# Patient Record
Sex: Female | Born: 1935 | Race: Black or African American | Hispanic: No | Smoking: Former smoker
Health system: Southern US, Community
[De-identification: ages and names within clinical notes are randomized; demographics above are authoritative.]

## PROBLEM LIST (undated history)

## (undated) DIAGNOSIS — R51 Headache: Secondary | ICD-10-CM

## (undated) DIAGNOSIS — I10 Essential (primary) hypertension: Secondary | ICD-10-CM

## (undated) DIAGNOSIS — F411 Generalized anxiety disorder: Secondary | ICD-10-CM

## (undated) DIAGNOSIS — M1990 Unspecified osteoarthritis, unspecified site: Secondary | ICD-10-CM

## (undated) DIAGNOSIS — Z862 Personal history of diseases of the blood and blood-forming organs and certain disorders involving the immune mechanism: Secondary | ICD-10-CM

## (undated) DIAGNOSIS — E119 Type 2 diabetes mellitus without complications: Secondary | ICD-10-CM

## (undated) DIAGNOSIS — E785 Hyperlipidemia, unspecified: Secondary | ICD-10-CM

## (undated) DIAGNOSIS — K259 Gastric ulcer, unspecified as acute or chronic, without hemorrhage or perforation: Secondary | ICD-10-CM

## (undated) DIAGNOSIS — Z8639 Personal history of other endocrine, nutritional and metabolic disease: Secondary | ICD-10-CM

## (undated) HISTORY — DX: Generalized anxiety disorder: F41.1

## (undated) HISTORY — DX: Personal history of other endocrine, nutritional and metabolic disease: Z86.39

## (undated) HISTORY — DX: Personal history of diseases of the blood and blood-forming organs and certain disorders involving the immune mechanism: Z86.2

## (undated) HISTORY — DX: Gastric ulcer, unspecified as acute or chronic, without hemorrhage or perforation: K25.9

## (undated) HISTORY — DX: Essential (primary) hypertension: I10

## (undated) HISTORY — DX: Hyperlipidemia, unspecified: E78.5

## (undated) HISTORY — PX: CATARACT EXTRACTION: SUR2

## (undated) HISTORY — PX: ABDOMINAL HYSTERECTOMY: SHX81

## (undated) HISTORY — DX: Headache: R51

## (undated) HISTORY — DX: Unspecified osteoarthritis, unspecified site: M19.90

## (undated) HISTORY — DX: Type 2 diabetes mellitus without complications: E11.9

---

## 2000-04-01 ENCOUNTER — Encounter: Admission: RE | Admit: 2000-04-01 | Discharge: 2000-04-01 | Payer: Self-pay | Admitting: Internal Medicine

## 2000-04-01 ENCOUNTER — Encounter: Payer: Self-pay | Admitting: Internal Medicine

## 2001-04-12 ENCOUNTER — Encounter: Payer: Self-pay | Admitting: Internal Medicine

## 2001-04-12 ENCOUNTER — Encounter: Admission: RE | Admit: 2001-04-12 | Discharge: 2001-04-12 | Payer: Self-pay | Admitting: Internal Medicine

## 2002-04-10 ENCOUNTER — Encounter: Admission: RE | Admit: 2002-04-10 | Discharge: 2002-04-10 | Payer: Self-pay | Admitting: Internal Medicine

## 2002-04-10 ENCOUNTER — Encounter: Payer: Self-pay | Admitting: Internal Medicine

## 2003-11-01 ENCOUNTER — Ambulatory Visit (HOSPITAL_COMMUNITY): Admission: RE | Admit: 2003-11-01 | Discharge: 2003-11-01 | Payer: Self-pay | Admitting: Internal Medicine

## 2004-08-25 ENCOUNTER — Ambulatory Visit: Payer: Self-pay | Admitting: Internal Medicine

## 2004-11-18 ENCOUNTER — Ambulatory Visit: Payer: Self-pay | Admitting: Internal Medicine

## 2004-11-19 ENCOUNTER — Encounter: Admission: RE | Admit: 2004-11-19 | Discharge: 2004-11-19 | Payer: Self-pay | Admitting: Internal Medicine

## 2004-12-17 ENCOUNTER — Ambulatory Visit: Payer: Self-pay | Admitting: Internal Medicine

## 2005-01-14 ENCOUNTER — Ambulatory Visit: Payer: Self-pay | Admitting: Internal Medicine

## 2005-01-22 ENCOUNTER — Emergency Department (HOSPITAL_COMMUNITY): Admission: EM | Admit: 2005-01-22 | Discharge: 2005-01-22 | Payer: Self-pay | Admitting: Emergency Medicine

## 2005-03-17 ENCOUNTER — Ambulatory Visit: Payer: Self-pay | Admitting: Internal Medicine

## 2005-04-15 ENCOUNTER — Ambulatory Visit: Payer: Self-pay | Admitting: Internal Medicine

## 2005-06-24 ENCOUNTER — Ambulatory Visit: Payer: Self-pay | Admitting: Internal Medicine

## 2005-09-23 ENCOUNTER — Ambulatory Visit: Payer: Self-pay | Admitting: Internal Medicine

## 2005-09-24 ENCOUNTER — Encounter: Payer: Self-pay | Admitting: Internal Medicine

## 2005-12-31 ENCOUNTER — Ambulatory Visit: Payer: Self-pay | Admitting: Internal Medicine

## 2006-03-16 ENCOUNTER — Encounter: Admission: RE | Admit: 2006-03-16 | Discharge: 2006-03-16 | Payer: Self-pay | Admitting: Internal Medicine

## 2006-03-16 ENCOUNTER — Encounter: Payer: Self-pay | Admitting: Internal Medicine

## 2006-03-31 ENCOUNTER — Ambulatory Visit: Payer: Self-pay | Admitting: Internal Medicine

## 2006-07-07 ENCOUNTER — Ambulatory Visit: Payer: Self-pay | Admitting: Internal Medicine

## 2006-10-27 ENCOUNTER — Ambulatory Visit: Payer: Self-pay | Admitting: Internal Medicine

## 2006-10-27 LAB — CONVERTED CEMR LAB
ALT: 11 units/L (ref 0–40)
Alkaline Phosphatase: 79 units/L (ref 39–117)
BUN: 18 mg/dL (ref 6–23)
Basophils Absolute: 0 10*3/uL (ref 0.0–0.1)
Basophils Relative: 0.5 % (ref 0.0–1.0)
Bilirubin, Direct: 0.1 mg/dL (ref 0.0–0.3)
CO2: 27 meq/L (ref 19–32)
Calcium: 9.5 mg/dL (ref 8.4–10.5)
Eosinophils Absolute: 0.1 10*3/uL (ref 0.0–0.6)
HCT: 38.3 % (ref 36.0–46.0)
Hemoglobin: 13.2 g/dL (ref 12.0–15.0)
MCHC: 34.4 g/dL (ref 30.0–36.0)
MCV: 79.3 fL (ref 78.0–100.0)
Monocytes Absolute: 0.6 10*3/uL (ref 0.2–0.7)
Monocytes Relative: 12.2 % — ABNORMAL HIGH (ref 3.0–11.0)
Platelets: 168 10*3/uL (ref 150–400)
Sodium: 143 meq/L (ref 135–145)
Total Protein: 7.6 g/dL (ref 6.0–8.3)
WBC: 4.6 10*3/uL (ref 4.5–10.5)

## 2007-01-31 ENCOUNTER — Encounter: Payer: Self-pay | Admitting: Internal Medicine

## 2007-01-31 ENCOUNTER — Ambulatory Visit: Payer: Self-pay | Admitting: Internal Medicine

## 2007-01-31 DIAGNOSIS — R51 Headache: Secondary | ICD-10-CM

## 2007-01-31 DIAGNOSIS — F411 Generalized anxiety disorder: Secondary | ICD-10-CM

## 2007-01-31 DIAGNOSIS — E119 Type 2 diabetes mellitus without complications: Secondary | ICD-10-CM | POA: Insufficient documentation

## 2007-01-31 DIAGNOSIS — E785 Hyperlipidemia, unspecified: Secondary | ICD-10-CM

## 2007-01-31 DIAGNOSIS — R519 Headache, unspecified: Secondary | ICD-10-CM | POA: Insufficient documentation

## 2007-01-31 DIAGNOSIS — M1990 Unspecified osteoarthritis, unspecified site: Secondary | ICD-10-CM | POA: Insufficient documentation

## 2007-01-31 DIAGNOSIS — I10 Essential (primary) hypertension: Secondary | ICD-10-CM

## 2007-01-31 HISTORY — DX: Headache: R51

## 2007-01-31 HISTORY — DX: Unspecified osteoarthritis, unspecified site: M19.90

## 2007-01-31 HISTORY — DX: Hyperlipidemia, unspecified: E78.5

## 2007-01-31 HISTORY — DX: Essential (primary) hypertension: I10

## 2007-01-31 HISTORY — DX: Type 2 diabetes mellitus without complications: E11.9

## 2007-01-31 HISTORY — DX: Generalized anxiety disorder: F41.1

## 2007-04-28 ENCOUNTER — Telehealth: Payer: Self-pay | Admitting: Internal Medicine

## 2007-05-04 ENCOUNTER — Ambulatory Visit: Payer: Self-pay | Admitting: Internal Medicine

## 2007-05-04 LAB — CONVERTED CEMR LAB: Hgb A1c MFr Bld: 6.9 % — ABNORMAL HIGH (ref 4.6–6.0)

## 2007-08-01 ENCOUNTER — Ambulatory Visit: Payer: Self-pay | Admitting: Internal Medicine

## 2007-08-01 LAB — CONVERTED CEMR LAB: Hgb A1c MFr Bld: 7 % — ABNORMAL HIGH (ref 4.6–6.0)

## 2007-11-01 ENCOUNTER — Telehealth: Payer: Self-pay | Admitting: Internal Medicine

## 2007-11-10 ENCOUNTER — Ambulatory Visit: Payer: Self-pay | Admitting: Internal Medicine

## 2007-11-11 LAB — CONVERTED CEMR LAB
AST: 18 units/L (ref 0–37)
Albumin: 3.5 g/dL (ref 3.5–5.2)
Basophils Absolute: 0 10*3/uL (ref 0.0–0.1)
Basophils Relative: 0.1 % (ref 0.0–1.0)
Bilirubin, Direct: 0.1 mg/dL (ref 0.0–0.3)
CO2: 27 meq/L (ref 19–32)
Creatinine, Ser: 0.8 mg/dL (ref 0.4–1.2)
Eosinophils Absolute: 0 10*3/uL (ref 0.0–0.7)
Eosinophils Relative: 1 % (ref 0.0–5.0)
GFR calc Af Amer: 88 mL/min
Glucose, Bld: 142 mg/dL — ABNORMAL HIGH (ref 70–99)
HCT: 40.6 % (ref 36.0–46.0)
Hemoglobin: 13.2 g/dL (ref 12.0–15.0)
Hgb A1c MFr Bld: 7.5 % — ABNORMAL HIGH (ref 4.6–6.0)
Lymphocytes Relative: 23.4 % (ref 12.0–46.0)
Monocytes Relative: 10.7 % (ref 3.0–12.0)
Potassium: 3 meq/L — ABNORMAL LOW (ref 3.5–5.1)
RDW: 13.2 % (ref 11.5–14.6)
Total Bilirubin: 0.5 mg/dL (ref 0.3–1.2)
Total Protein: 7.2 g/dL (ref 6.0–8.3)

## 2008-02-09 ENCOUNTER — Ambulatory Visit: Payer: Self-pay | Admitting: Internal Medicine

## 2008-02-09 DIAGNOSIS — Z8639 Personal history of other endocrine, nutritional and metabolic disease: Secondary | ICD-10-CM

## 2008-02-09 DIAGNOSIS — Z862 Personal history of diseases of the blood and blood-forming organs and certain disorders involving the immune mechanism: Secondary | ICD-10-CM

## 2008-02-09 HISTORY — DX: Personal history of diseases of the blood and blood-forming organs and certain disorders involving the immune mechanism: Z86.39

## 2008-02-09 HISTORY — DX: Personal history of diseases of the blood and blood-forming organs and certain disorders involving the immune mechanism: Z86.2

## 2008-02-09 LAB — CONVERTED CEMR LAB
BUN: 21 mg/dL (ref 6–23)
CO2: 24 meq/L (ref 19–32)
Calcium: 8.9 mg/dL (ref 8.4–10.5)
Creatinine, Ser: 1 mg/dL (ref 0.4–1.2)
GFR calc Af Amer: 68 mL/min
GFR calc non Af Amer: 56 mL/min
Hgb A1c MFr Bld: 6.6 % — ABNORMAL HIGH (ref 4.6–6.0)
Sodium: 141 meq/L (ref 135–145)

## 2008-05-03 ENCOUNTER — Ambulatory Visit: Payer: Self-pay | Admitting: Internal Medicine

## 2008-08-02 ENCOUNTER — Ambulatory Visit: Payer: Self-pay | Admitting: Internal Medicine

## 2008-11-06 ENCOUNTER — Ambulatory Visit: Payer: Self-pay | Admitting: Internal Medicine

## 2008-11-06 LAB — CONVERTED CEMR LAB
Albumin: 3.5 g/dL (ref 3.5–5.2)
Basophils Relative: 0.4 % (ref 0.0–3.0)
Bilirubin, Direct: 0.1 mg/dL (ref 0.0–0.3)
Chloride: 109 meq/L (ref 96–112)
Eosinophils Absolute: 0.1 10*3/uL (ref 0.0–0.7)
Eosinophils Relative: 1.9 % (ref 0.0–5.0)
Glucose, Bld: 126 mg/dL — ABNORMAL HIGH (ref 70–99)
Lymphocytes Relative: 34 % (ref 12.0–46.0)
Lymphs Abs: 1.7 10*3/uL (ref 0.7–4.0)
Monocytes Relative: 11.9 % (ref 3.0–12.0)
Neutrophils Relative %: 51.8 % (ref 43.0–77.0)
Platelets: 164 10*3/uL (ref 150.0–400.0)
Potassium: 4 meq/L (ref 3.5–5.1)
Sodium: 144 meq/L (ref 135–145)
TSH: 1.02 microintl units/mL (ref 0.35–5.50)
Total Protein: 7.3 g/dL (ref 6.0–8.3)

## 2008-11-26 ENCOUNTER — Encounter: Payer: Self-pay | Admitting: Internal Medicine

## 2009-01-08 ENCOUNTER — Telehealth: Payer: Self-pay | Admitting: Internal Medicine

## 2009-02-12 ENCOUNTER — Ambulatory Visit: Payer: Self-pay | Admitting: Internal Medicine

## 2009-02-12 LAB — CONVERTED CEMR LAB: Hgb A1c MFr Bld: 7.2 % — ABNORMAL HIGH (ref 4.6–6.5)

## 2009-04-19 ENCOUNTER — Encounter: Payer: Self-pay | Admitting: Internal Medicine

## 2009-05-08 ENCOUNTER — Ambulatory Visit: Payer: Self-pay | Admitting: Internal Medicine

## 2009-05-08 LAB — CONVERTED CEMR LAB
CO2: 28 meq/L (ref 19–32)
Chloride: 105 meq/L (ref 96–112)
GFR calc non Af Amer: 102.01 mL/min (ref >60)
Glucose, Bld: 123 mg/dL — ABNORMAL HIGH (ref 70–99)
Potassium: 4.4 meq/L (ref 3.5–5.1)

## 2009-08-16 ENCOUNTER — Ambulatory Visit: Payer: Self-pay | Admitting: Internal Medicine

## 2009-08-16 LAB — CONVERTED CEMR LAB
Blood Glucose, Fingerstick: 134
Hgb A1c MFr Bld: 7 % — ABNORMAL HIGH (ref 4.6–6.5)

## 2009-11-18 ENCOUNTER — Ambulatory Visit: Payer: Self-pay | Admitting: Internal Medicine

## 2009-11-18 LAB — CONVERTED CEMR LAB
ALT: 13 units/L (ref 0–35)
Albumin: 3.7 g/dL (ref 3.5–5.2)
Alkaline Phosphatase: 62 units/L (ref 39–117)
Chloride: 108 meq/L (ref 96–112)
Creatinine, Ser: 1 mg/dL (ref 0.4–1.2)
Eosinophils Relative: 1.3 % (ref 0.0–5.0)
GFR calc non Af Amer: 67.51 mL/min (ref >60)
HCT: 37.3 % (ref 36.0–46.0)
MCHC: 33.6 g/dL (ref 30.0–36.0)
MCV: 80.9 fL (ref 78.0–100.0)
Monocytes Absolute: 0.6 10*3/uL (ref 0.1–1.0)
Monocytes Relative: 12.8 % — ABNORMAL HIGH (ref 3.0–12.0)
Neutro Abs: 2.2 10*3/uL (ref 1.4–7.7)
Neutrophils Relative %: 48.7 % (ref 43.0–77.0)
RBC: 4.61 M/uL (ref 3.87–5.11)
RDW: 15.7 % — ABNORMAL HIGH (ref 11.5–14.6)
TSH: 0.81 microintl units/mL (ref 0.35–5.50)

## 2009-12-26 ENCOUNTER — Encounter: Payer: Self-pay | Admitting: Internal Medicine

## 2010-01-16 ENCOUNTER — Encounter: Payer: Self-pay | Admitting: Internal Medicine

## 2010-02-18 ENCOUNTER — Ambulatory Visit: Payer: Self-pay | Admitting: Internal Medicine

## 2010-02-18 LAB — HM DIABETES EYE EXAM: HM Diabetic Eye Exam: NORMAL

## 2010-02-18 LAB — HM DIABETES FOOT EXAM

## 2010-02-18 LAB — CONVERTED CEMR LAB
Blood Glucose, Fingerstick: 218
Hgb A1c MFr Bld: 7.2 % — ABNORMAL HIGH (ref 4.6–6.5)

## 2010-04-30 ENCOUNTER — Telehealth: Payer: Self-pay | Admitting: Internal Medicine

## 2010-05-23 ENCOUNTER — Ambulatory Visit: Payer: Self-pay | Admitting: Internal Medicine

## 2010-05-29 ENCOUNTER — Encounter: Payer: Self-pay | Admitting: Internal Medicine

## 2010-08-22 ENCOUNTER — Ambulatory Visit
Admission: RE | Admit: 2010-08-22 | Discharge: 2010-08-22 | Payer: Self-pay | Source: Home / Self Care | Attending: Internal Medicine | Admitting: Internal Medicine

## 2010-08-22 ENCOUNTER — Other Ambulatory Visit: Payer: Self-pay | Admitting: Internal Medicine

## 2010-08-22 LAB — HEMOGLOBIN A1C: Hgb A1c MFr Bld: 8 % — ABNORMAL HIGH (ref 4.6–6.5)

## 2010-08-28 NOTE — Medication Information (Signed)
Summary: Order for Diabetic Testing Supplies  Order for Diabetic Testing Supplies   Imported By: Maryln Gottron 05/30/2010 13:18:12  _____________________________________________________________________  External Attachment:    Type:   Image     Comment:   External Document

## 2010-08-28 NOTE — Assessment & Plan Note (Signed)
Summary: 3 MONTH ROV/NJR/daughter rescd//ccm   Vital Signs:  Patient profile:   77 year old female Weight:      142 pounds Temp:     98.4 degrees F oral BP sitting:   120 / 58  (left arm) Cuff size:   regular  Vitals Entered By: Raechel Ache, RN (August 16, 2009 8:44 AM) CC: ROV Is Patient Diabetic? Yes CBG Result 134   CC:  ROV.  History of Present Illness: an 77 year old patient who has a history of hypertension and type 2 diabetes.  She has chronic low back pain and DJD.  She has done the clot well over the past year and continues to lose weight.  Her weight is down, probably 20 pounds over the past 12 or 15 months.  She feels well and blood sugars have been under nice control.  Her last hemoglobin A1c7.3.  She also has a history of mild dyslipidemia.  No new concerns or complaints today.  She denies any cardiopulmonary complaints.  She has a history also of headaches, which have been stable  Allergies: 1)  ! Codeine 2)  ! * Fiorinal  Past History:  Past Medical History: Reviewed history from 01/31/2007 and no changes required. Diabetes mellitus, type II Hyperlipidemia Hypertension Osteoarthritis Anxiety Headache  Past Surgical History: Reviewed history from 01/31/2007 and no changes required. Cataract extraction Hysterectomy  Review of Systems       The patient complains of headaches and difficulty walking.  The patient denies anorexia, fever, weight loss, weight gain, vision loss, decreased hearing, hoarseness, chest pain, syncope, dyspnea on exertion, peripheral edema, prolonged cough, hemoptysis, abdominal pain, melena, hematochezia, severe indigestion/heartburn, hematuria, incontinence, genital sores, muscle weakness, suspicious skin lesions, transient blindness, depression, unusual weight change, abnormal bleeding, enlarged lymph nodes, angioedema, and breast masses.    Physical Exam  General:  overweight-appearing.  130/68overweight-appearing.   Head:   Normocephalic and atraumatic without obvious abnormalities. No apparent alopecia or balding. Eyes:  No corneal or conjunctival inflammation noted. EOMI. Perrla. Funduscopic exam benign, without hemorrhages, exudates or papilledema. Vision grossly normal. Mouth:  Oral mucosa and oropharynx without lesions or exudates.  Teeth in good repair. Neck:  No deformities, masses, or tenderness noted. Lungs:  Normal respiratory effort, chest expands symmetrically. Lungs are clear to auscultation, no crackles or wheezes. Heart:  Normal rate and regular rhythm. S1 and S2 normal without gallop, murmur, click, rub or other extra sounds. Abdomen:  Bowel sounds positive,abdomen soft and non-tender without masses, organomegaly or hernias noted. Msk:  No deformity or scoliosis noted of thoracic or lumbar spine.   Extremities:  No clubbing, cyanosis, edema, or deformity noted with normal full range of motion of all joints.   Skin:  Intact without suspicious lesions or rashes Cervical Nodes:  No lymphadenopathy noted   Impression & Recommendations:  Problem # 1:  HEADACHE (ICD-784.0)  Her updated medication list for this problem includes:    Sedapap 50-650 Mg Tabs (Butalbital-acetaminophen) .Marland Kitchen... 1 two times a day as needed  Her updated medication list for this problem includes:    Sedapap 50-650 Mg Tabs (Butalbital-acetaminophen) .Marland Kitchen... 1 two times a day as needed  Problem # 2:  OSTEOARTHRITIS (ICD-715.90)  Her updated medication list for this problem includes:    Sedapap 50-650 Mg Tabs (Butalbital-acetaminophen) .Marland Kitchen... 1 two times a day as needed  Her updated medication list for this problem includes:    Sedapap 50-650 Mg Tabs (Butalbital-acetaminophen) .Marland Kitchen... 1 two times a day as needed  Problem # 3:  HYPERTENSION (ICD-401.9)  Her updated medication list for this problem includes:    Verapamil Hcl Cr 180 Mg Tbcr (Verapamil hcl) .Marland Kitchen... 1 once daily    Hydrochlorothiazide 25 Mg Tabs (Hydrochlorothiazide)  .Marland Kitchen... 1 once daily  Her updated medication list for this problem includes:    Verapamil Hcl Cr 180 Mg Tbcr (Verapamil hcl) .Marland Kitchen... 1 once daily    Hydrochlorothiazide 25 Mg Tabs (Hydrochlorothiazide) .Marland Kitchen... 1 once daily  Problem # 4:  DIABETES MELLITUS, TYPE II (ICD-250.00)  Her updated medication list for this problem includes:    Glipizide 5 Mg Tb24 (Glipizide) .Marland Kitchen... 1 once daily  Orders: Capillary Blood Glucose/CBG (16109) Venipuncture (60454) TLB-A1C / Hgb A1C (Glycohemoglobin) (83036-A1C)  Her updated medication list for this problem includes:    Glipizide 5 Mg Tb24 (Glipizide) .Marland Kitchen... 1 once daily  Complete Medication List: 1)  Valium 5 Mg Tabs (Diazepam) .Marland Kitchen.. 1 two times a day as needed 2)  Sedapap 50-650 Mg Tabs (Butalbital-acetaminophen) .Marland Kitchen.. 1 two times a day as needed 3)  Verapamil Hcl Cr 180 Mg Tbcr (Verapamil hcl) .Marland Kitchen.. 1 once daily 4)  Glipizide 5 Mg Tb24 (Glipizide) .Marland Kitchen.. 1 once daily 5)  Hydrochlorothiazide 25 Mg Tabs (Hydrochlorothiazide) .Marland Kitchen.. 1 once daily 6)  K-lor 20 Meq Pack (Potassium chloride) .Marland Kitchen.. 1 two times a day 7)  Bd Ultra-fine 33 Lancets Misc (Lancets) .... Use three times a day as needed  Patient Instructions: 1)  Please schedule a follow-up appointment in 3 months. 2)  Limit your Sodium (Salt). 3)  Check your blood sugars regularly. If your readings are usually above : or below 70 you should contact our office. 4)  It is important that your Diabetic A1c level is checked every 3 months. 5)  See your eye doctor yearly to check for diabetic eye damage. Prescriptions: K-LOR 20 MEQ  PACK (POTASSIUM CHLORIDE) 1 two times a day  #180 x 6   Entered and Authorized by:   Gordy Savers  MD   Signed by:   Gordy Savers  MD on 08/16/2009   Method used:   Print then Give to Patient   RxID:   [id number] HYDROCHLOROTHIAZIDE 25 MG  TABS (HYDROCHLOROTHIAZIDE) 1 once daily  #90 x 6   Entered and Authorized by:   Gordy Savers  MD   Signed by:    Gordy Savers  MD on 08/16/2009   Method used:   Print then Give to Patient   RxID:   [id number] VERAPAMIL HCL CR 180 MG  TBCR (VERAPAMIL HCL) 1 once daily  #90 x 6   Entered and Authorized by:   Gordy Savers  MD   Signed by:   Gordy Savers  MD on 08/16/2009   Method used:   Print then Give to Patient   RxID:   [id number] GLIPIZIDE 5 MG  TB24 (GLIPIZIDE) 1 once daily  #90 x 6   Entered and Authorized by:   Gordy Savers  MD   Signed by:   Gordy Savers  MD on 08/16/2009   Method used:   Print then Give to Patient   RxID:   [id number] SEDAPAP 50-650 MG  TABS (BUTALBITAL-ACETAMINOPHEN) 1 two times a day as needed  #60 x 2   Entered and Authorized by:   Gordy Savers  MD   Signed by:   Gordy Savers  MD on 08/16/2009   Method used:   Print then Give to Patient  RxID:   [id number] VALIUM 5 MG  TABS (DIAZEPAM) 1 two times a day as needed  #60 x 2   Entered and Authorized by:   Gordy Savers  MD   Signed by:   Gordy Savers  MD on 08/16/2009   Method used:   Print then Give to Patient   RxID:   [id number]

## 2010-08-28 NOTE — Assessment & Plan Note (Signed)
Summary: 3 month rov/njr   Vital Signs:  Patient profile:   77 year old female Weight:      149 pounds Temp:     97.9 degrees F oral BP sitting:   122 / 70  (right arm) Cuff size:   regular  Vitals Entered By: Duard Brady LPN (November 18, 2009 8:35 AM) CC: 3 mos rov - doing well    fbs 108 Is Patient Diabetic? Yes Did you bring your meter with you today? No   CC:  3 mos rov - doing well    fbs 108.  History of Present Illness: 77 year old patient here has a history of chronic arthritis and low back pain.  Chest had hypertension which has been stable, dyslipidemia, and type 2 diabetes.  She has done quite well.  It has been awake and a 7-pound since her last visit here.  Her fasting blood sugar today 108.  Her blood pressure remains under good control.  She denies any cardiopulmonary complaints.  Denies any symptoms consistent with hypoglycemia.  Medical regimen for her diabetes includes glipizide only. she has a history of chronic headaches, which have been stable  Preventive Screening-Counseling & Management  Alcohol-Tobacco     Smoking Status: quit  Allergies: 1)  ! Codeine 2)  ! * Fiorinal  Past History:  Past Medical History: Reviewed history from 01/31/2007 and no changes required. Diabetes mellitus, type II Hyperlipidemia Hypertension Osteoarthritis Anxiety Headache  Past Surgical History: Reviewed history from 01/31/2007 and no changes required. Cataract extraction Hysterectomy  Social History: Smoking Status:  quit  Review of Systems       The patient complains of weight gain, headaches, and difficulty walking.  The patient denies anorexia, fever, weight loss, vision loss, decreased hearing, hoarseness, chest pain, syncope, dyspnea on exertion, peripheral edema, prolonged cough, hemoptysis, abdominal pain, melena, hematochezia, severe indigestion/heartburn, hematuria, incontinence, genital sores, muscle weakness, suspicious skin lesions, transient  blindness, depression, unusual weight change, abnormal bleeding, enlarged lymph nodes, angioedema, and breast masses.    Physical Exam  General:  overweight-appearing.  alert cooperative, in no distress.  Blood pressure 122/70overweight-appearing.   Head:  Normocephalic and atraumatic without obvious abnormalities. No apparent alopecia or balding. Eyes:  No corneal or conjunctival inflammation noted. EOMI. Perrla. Funduscopic exam benign, without hemorrhages, exudates or papilledema. Vision grossly normal. Mouth:  Oral mucosa and oropharynx without lesions or exudates.   Neck:  No deformities, masses, or tenderness noted. Chest Wall:  No deformities, masses, or tenderness noted. Lungs:  Normal respiratory effort, chest expands symmetrically. Lungs are clear to auscultation, no crackles or wheezes. Heart:  Normal rate and regular rhythm. S1 and S2 normal without gallop, murmur, click, rub or other extra sounds. Abdomen:  Bowel sounds positive,abdomen soft and non-tender without masses, organomegaly or hernias noted. Msk:  No deformity or scoliosis noted of thoracic or lumbar spine.   Extremities:  trace left pedal edema and trace right pedal edema.  trace left pedal edema.   Skin:  Intact without suspicious lesions or rashes Cervical Nodes:  No lymphadenopathy noted   Impression & Recommendations:  Problem # 1:  HYPOKALEMIA, HX OF (ICD-V12.2)  Orders: Venipuncture (60454) TLB-BMP (Basic Metabolic Panel-BMET) (80048-METABOL) TLB-CBC Platelet - w/Differential (85025-CBCD) TLB-Hepatic/Liver Function Pnl (80076-HEPATIC) TLB-TSH (Thyroid Stimulating Hormone) (84443-TSH)  Problem # 2:  HEADACHE (ICD-784.0)  Her updated medication list for this problem includes:    Sedapap 50-650 Mg Tabs (Butalbital-acetaminophen) .Marland Kitchen... 1 two times a day as needed  Her updated medication list  for this problem includes:    Sedapap 50-650 Mg Tabs (Butalbital-acetaminophen) .Marland Kitchen... 1 two times a day as  needed  Problem # 3:  OSTEOARTHRITIS (ICD-715.90)  Her updated medication list for this problem includes:    Sedapap 50-650 Mg Tabs (Butalbital-acetaminophen) .Marland Kitchen... 1 two times a day as needed  Her updated medication list for this problem includes:    Sedapap 50-650 Mg Tabs (Butalbital-acetaminophen) .Marland Kitchen... 1 two times a day as needed  Orders: Venipuncture (16109) TLB-BMP (Basic Metabolic Panel-BMET) (80048-METABOL) TLB-CBC Platelet - w/Differential (85025-CBCD) TLB-Hepatic/Liver Function Pnl (80076-HEPATIC) TLB-TSH (Thyroid Stimulating Hormone) (84443-TSH)  Problem # 4:  HYPERTENSION (ICD-401.9)  Her updated medication list for this problem includes:    Verapamil Hcl Cr 180 Mg Tbcr (Verapamil hcl) .Marland Kitchen... 1 once daily    Hydrochlorothiazide 25 Mg Tabs (Hydrochlorothiazide) .Marland Kitchen... 1 once daily  Her updated medication list for this problem includes:    Verapamil Hcl Cr 180 Mg Tbcr (Verapamil hcl) .Marland Kitchen... 1 once daily    Hydrochlorothiazide 25 Mg Tabs (Hydrochlorothiazide) .Marland Kitchen... 1 once daily  Orders: Venipuncture (60454) TLB-BMP (Basic Metabolic Panel-BMET) (80048-METABOL) TLB-CBC Platelet - w/Differential (85025-CBCD) TLB-Hepatic/Liver Function Pnl (80076-HEPATIC) TLB-TSH (Thyroid Stimulating Hormone) (84443-TSH)  Problem # 5:  DIABETES MELLITUS, TYPE II (ICD-250.00)  Her updated medication list for this problem includes:    Glipizide 5 Mg Tb24 (Glipizide) .Marland Kitchen... 1 once daily  Orders: Venipuncture (09811) TLB-BMP (Basic Metabolic Panel-BMET) (80048-METABOL) TLB-CBC Platelet - w/Differential (85025-CBCD) TLB-Hepatic/Liver Function Pnl (80076-HEPATIC) TLB-TSH (Thyroid Stimulating Hormone) (84443-TSH) TLB-A1C / Hgb A1C (Glycohemoglobin) (83036-A1C)  Complete Medication List: 1)  Valium 5 Mg Tabs (Diazepam) .Marland Kitchen.. 1 two times a day as needed 2)  Sedapap 50-650 Mg Tabs (Butalbital-acetaminophen) .Marland Kitchen.. 1 two times a day as needed 3)  Verapamil Hcl Cr 180 Mg Tbcr (Verapamil hcl) .Marland Kitchen..  1 once daily 4)  Glipizide 5 Mg Tb24 (Glipizide) .Marland Kitchen.. 1 once daily 5)  Hydrochlorothiazide 25 Mg Tabs (Hydrochlorothiazide) .Marland Kitchen.. 1 once daily 6)  K-lor 20 Meq Pack (Potassium chloride) .Marland Kitchen.. 1 two times a day 7)  Bd Ultra-fine 33 Lancets Misc (Lancets) .... Use three times a day as needed  Other Orders: TLB-Cholesterol, Total (82465-CHO)  Patient Instructions: 1)  Please schedule a follow-up appointment in 3 months. 2)  Limit your Sodium (Salt). 3)  You need to lose weight. Consider a lower calorie diet and regular exercise.  4)  Check your blood sugars regularly. If your readings are usually above : or below 70 you should contact our office. 5)  It is important that your Diabetic A1c level is checked every 3 months. Prescriptions: BD ULTRA-FINE 33 LANCETS  MISC (LANCETS) use three times a day as needed  #100 Each x 5   Entered and Authorized by:   Gordy Savers  MD   Signed by:   Gordy Savers  MD on 11/18/2009   Method used:   Print then Give to Patient   RxID:   [id number] K-LOR 20 MEQ  PACK (POTASSIUM CHLORIDE) 1 two times a day  #180 x 6   Entered and Authorized by:   Gordy Savers  MD   Signed by:   Gordy Savers  MD on 11/18/2009   Method used:   Print then Give to Patient   RxID:   [id number] HYDROCHLOROTHIAZIDE 25 MG  TABS (HYDROCHLOROTHIAZIDE) 1 once daily  #90 x 6   Entered and Authorized by:   Gordy Savers  MD   Signed by:   Gordy Savers  MD on 11/18/2009   Method used:   Print then Give to Patient   RxID:   [id number] GLIPIZIDE 5 MG  TB24 (GLIPIZIDE) 1 once daily  #90 x 6   Entered and Authorized by:   Gordy Savers  MD   Signed by:   Gordy Savers  MD on 11/18/2009   Method used:   Print then Give to Patient   RxID:   [id number] VERAPAMIL HCL CR 180 MG  TBCR (VERAPAMIL HCL) 1 once daily  #90 x 6   Entered and Authorized by:   Gordy Savers  MD   Signed by:   Gordy Savers  MD on  11/18/2009   Method used:   Print then Give to Patient   RxID:   [id number] SEDAPAP 50-650 MG  TABS (BUTALBITAL-ACETAMINOPHEN) 1 two times a day as needed  #60 x 2   Entered and Authorized by:   Gordy Savers  MD   Signed by:   Gordy Savers  MD on 11/18/2009   Method used:   Print then Give to Patient   RxID:   [id number] VALIUM 5 MG  TABS (DIAZEPAM) 1 two times a day as needed  #60 x 2   Entered and Authorized by:   Gordy Savers  MD   Signed by:   Gordy Savers  MD on 11/18/2009   Method used:   Print then Give to Patient   RxID:   [id number]

## 2010-08-28 NOTE — Progress Notes (Signed)
Summary: new rx lancets  Phone Note Refill Request   techlite lancets no longer avilb - need new rx for bd ultra fine 30 g lancets  [place]   Method Requested: Fax to Local Pharmacy Initial call taken by: Duard Brady LPN,  April 30, 2010 11:50 AM    New/Updated Medications: BD ULTRA-FINE LANCETS  MISC (LANCETS) three times a day as directed Prescriptions: BD ULTRA-FINE 33 LANCETS  MISC (LANCETS) use three times a day as needed  #100 Each x 5   Entered by:   Duard Brady LPN   Authorized by:   Gordy Savers  MD   Signed by:   Duard Brady LPN on 04/54/0981   Method used:   Faxed to ...       [facility]* (retail)       [address]       [place], [place]  [address]       Ph: [phone]       Fax: [phone]   RxID:   [id number] BD ULTRA-FINE LANCETS  MISC (LANCETS) three times a day as directed  #100 x 6   Entered by:   Duard Brady LPN   Authorized by:   Gordy Savers  MD   Signed by:   Duard Brady LPN on 40/34/7425   Method used:   Faxed to ...       [facility]* (retail)       [address]       [place], [place]  [address]       Ph: [phone]       Fax: [phone]   RxID:   [id number]

## 2010-08-28 NOTE — Medication Information (Signed)
Summary: Order for Diabetic Testing Supplies  Order for Diabetic Testing Supplies   Imported By: Maryln Gottron 01/01/2010 10:50:36  _____________________________________________________________________  External Attachment:    Type:   Image     Comment:   External Document

## 2010-08-28 NOTE — Miscellaneous (Signed)
Summary: rx for lancets and test strips  Medications Added TECHLITE LANCETS  MISC (LANCETS) three times a day as directed PRESTIGE TEST  STRP (GLUCOSE BLOOD) three times a day as directed       Clinical Lists Changes  Medications: Added new medication of TECHLITE LANCETS  MISC (LANCETS) three times a day as directed - Signed Added new medication of PRESTIGE TEST  STRP (GLUCOSE BLOOD) three times a day as directed - Signed Rx of TECHLITE LANCETS  MISC (LANCETS) three times a day as directed;  #90day x 4;  Signed;  Entered by: Duard Brady LPN;  Authorized by: Gordy Savers  MD;  Method used: Electronically to [facility]*, [address], [place], [place]  [address], Ph: [phone], Fax: [phone] Rx of PRESTIGE TEST  STRP (GLUCOSE BLOOD) three times a day as directed;  #90 day x 4;  Signed;  Entered by: Duard Brady LPN;  Authorized by: Gordy Savers  MD;  Method used: Faxed to [facility]*, [address], [place], [place]  [address], Ph: [phone], Fax: [phone]    Prescriptions: PRESTIGE TEST  STRP (GLUCOSE BLOOD) three times a day as directed  #90 day x 4   Entered by:   Duard Brady LPN   Authorized by:   Gordy Savers  MD   Signed by:   Duard Brady LPN on 44/09/4740   Method used:   Faxed to ...       [facility]* (retail)       [address]       [place], [place]  [address]       Ph: [phone]       Fax: [phone]   RxID:   [id number] TECHLITE LANCETS  MISC (LANCETS) three times a day as directed  #90day x 4   Entered by:   Duard Brady LPN   Authorized by:   Gordy Savers  MD   Signed by:   Duard Brady LPN on 27/12/2374   Method used:   Electronically to        [facility]* (retail)       [address]       [place], [place]  [address]       Ph: [phone]       Fax: [phone]   RxID:   [id number]  faxed rx for  lancets and test strips per request of [place]. KIK

## 2010-08-28 NOTE — Assessment & Plan Note (Signed)
Summary: 3 month rov/njr   Vital Signs:  Patient profile:   77 year old female Weight:      133 pounds Temp:     98.4 degrees F oral BP sitting:   100 / 64  (right arm) Cuff size:   regular  Vitals Entered By: Duard Brady LPN (February 18, 2010 8:25 AM) CC: 3 mos rov - doing well       bs last noght 104 Is Patient Diabetic? Yes Did you bring your meter with you today? No CBG Result 218   CC:  3 mos rov - doing well       bs last noght 104.  History of Present Illness: 77 year old patient who is seen today for follow-up of her diabetes.  Her last him go.  A1c was well controlled.  Last night.  The blood sugar was 104, but a nonfasting blood sugar this morning to 18.  She has advanced osteoarthritis.  She is followed for hypertension and also has a history of dyslipidemia.  Laboratory studies were checked 3 months ago.  Her blood pressure has been well-controlled.  Denies any hypoglycemic episodes.  Allergies: 1)  ! Codeine 2)  ! * Fiorinal  Past History:  Past Medical History: Reviewed history from 01/31/2007 and no changes required. Diabetes mellitus, type II Hyperlipidemia Hypertension Osteoarthritis Anxiety Headache  Past Surgical History: Reviewed history from 01/31/2007 and no changes required. Cataract extraction Hysterectomy  Review of Systems       The patient complains of headaches, muscle weakness, and difficulty walking.  The patient denies anorexia, fever, weight loss, weight gain, vision loss, decreased hearing, hoarseness, chest pain, syncope, dyspnea on exertion, peripheral edema, prolonged cough, hemoptysis, abdominal pain, melena, hematochezia, severe indigestion/heartburn, hematuria, incontinence, genital sores, suspicious skin lesions, transient blindness, depression, unusual weight change, abnormal bleeding, enlarged lymph nodes, angioedema, and breast masses.    Physical Exam  General:  overweight-appearing.  110/68overweight-appearing.   Head:   Normocephalic and atraumatic without obvious abnormalities. No apparent alopecia or balding. Eyes:  No corneal or conjunctival inflammation noted. EOMI. Perrla. Funduscopic exam benign, without hemorrhages, exudates or papilledema. Vision grossly normal. Mouth:  Oral mucosa and oropharynx without lesions or exudates.  Teeth in good repair. Neck:  No deformities, masses, or tenderness noted. Lungs:  Normal respiratory effort, chest expands symmetrically. Lungs are clear to auscultation, no crackles or wheezes. Heart:  Normal rate and regular rhythm. S1 and S2 normal without gallop, murmur, click, rub or other extra sounds. Abdomen:  Bowel sounds positive,abdomen soft and non-tender without masses, organomegaly or hernias noted. Msk:  No deformity or scoliosis noted of thoracic or lumbar spine.   Extremities:  No clubbing, cyanosis, edema, or deformity noted with normal full range of motion of all joints.   Skin:  Intact without suspicious lesions or rashes Psych:  Cognition and judgment appear intact. Alert and cooperative with normal attention span and concentration. No apparent delusions, illusions, hallucinations  Diabetes Management Exam:    Foot Exam (with socks and/or shoes not present):       Sensory-Pinprick/Light touch:          Left medial foot (L-4): normal          Left dorsal foot (L-5): normal          Left lateral foot (S-1): normal          Right medial foot (L-4): normal          Right dorsal foot (L-5): normal  Right lateral foot (S-1): normal       Sensory-Monofilament:          Left foot: diminished          Right foot: diminished       Inspection:          Left foot: normal          Right foot: normal       Nails:          Left foot: thickened          Right foot: thickened    Foot Exam by Podiatrist:       Date: 02/18/2010       Results: early diabetic findings       Done by: PCP    Eye Exam:       Eye Exam done here today          Results:  normal   Impression & Recommendations:  Problem # 1:  OSTEOARTHRITIS (ICD-715.90)  Her updated medication list for this problem includes:    Sedapap 50-650 Mg Tabs (Butalbital-acetaminophen) .Marland Kitchen... 1 two times a day as needed  Her updated medication list for this problem includes:    Sedapap 50-650 Mg Tabs (Butalbital-acetaminophen) .Marland Kitchen... 1 two times a day as needed  Problem # 2:  HYPERTENSION (ICD-401.9)  Her updated medication list for this problem includes:    Verapamil Hcl Cr 180 Mg Tbcr (Verapamil hcl) .Marland Kitchen... 1 once daily    Hydrochlorothiazide 25 Mg Tabs (Hydrochlorothiazide) .Marland Kitchen... 1 once daily  Her updated medication list for this problem includes:    Verapamil Hcl Cr 180 Mg Tbcr (Verapamil hcl) .Marland Kitchen... 1 once daily    Hydrochlorothiazide 25 Mg Tabs (Hydrochlorothiazide) .Marland Kitchen... 1 once daily  Problem # 3:  DIABETES MELLITUS, TYPE II (ICD-250.00)  Her updated medication list for this problem includes:    Glipizide 5 Mg Tb24 (Glipizide) .Marland Kitchen... 1 once daily  Orders: Capillary Blood Glucose/CBG (16109) Venipuncture (60454) TLB-A1C / Hgb A1C (Glycohemoglobin) (83036-A1C) Specimen Handling (09811)  Her updated medication list for this problem includes:    Glipizide 5 Mg Tb24 (Glipizide) .Marland Kitchen... 1 once daily  Complete Medication List: 1)  Valium 5 Mg Tabs (Diazepam) .Marland Kitchen.. 1 two times a day as needed 2)  Sedapap 50-650 Mg Tabs (Butalbital-acetaminophen) .Marland Kitchen.. 1 two times a day as needed 3)  Verapamil Hcl Cr 180 Mg Tbcr (Verapamil hcl) .Marland Kitchen.. 1 once daily 4)  Glipizide 5 Mg Tb24 (Glipizide) .Marland Kitchen.. 1 once daily 5)  Hydrochlorothiazide 25 Mg Tabs (Hydrochlorothiazide) .Marland Kitchen.. 1 once daily 6)  K-lor 20 Meq Pack (Potassium chloride) .Marland Kitchen.. 1 two times a day 7)  Bd Ultra-fine 33 Lancets Misc (Lancets) .... Use three times a day as needed 8)  Techlite Lancets Misc (Lancets) .... Three times a day as directed 9)  Prestige Test Strp (Glucose blood) .... Three times a day as directed  Patient  Instructions: 1)  Please schedule a follow-up appointment in 3 months. 2)  Limit your Sodium (Salt) to less than 2 grams a day(slightly less than 1/2 a teaspoon) to prevent fluid retention, swelling, or worsening of symptoms. 3)  You need to lose weight. Consider a lower calorie diet and regular exercise.  4)  Check your blood sugars regularly. If your readings are usually above : or below 70 you should contact our office. 5)  It is important that your Diabetic A1c level is checked every 3 months. 6)  See your eye doctor yearly to check  for diabetic eye damage. Prescriptions: SEDAPAP 50-650 MG  TABS (BUTALBITAL-ACETAMINOPHEN) 1 two times a day as needed  #60 x 2   Entered and Authorized by:   Gordy Savers  MD   Signed by:   Gordy Savers  MD on 02/18/2010   Method used:   Print then Give to Patient   RxID:   [id number] VALIUM 5 MG  TABS (DIAZEPAM) 1 two times a day as needed  #60 x 2   Entered and Authorized by:   Gordy Savers  MD   Signed by:   Gordy Savers  MD on 02/18/2010   Method used:   Print then Give to Patient   RxID:   [id number]

## 2010-08-28 NOTE — Assessment & Plan Note (Signed)
Summary: 3 month fup/cjr   Vital Signs:  Patient profile:   77 year old female Weight:      133 pounds Temp:     98.0 degrees F oral BP sitting:   110 / 70  (left arm) Cuff size:   regular  Vitals Entered By: Duard Brady LPN (August 22, 2010 8:42 AM) CC: 3 mos rov - doing well   Is Patient Diabetic? Yes Did you bring your meter with you today? No CBG Result 100   CC:  3 mos rov - doing well  .  History of Present Illness: 77 year old patient who is seen today for follow-up of her type 2 diabetes.  She has hypertension and advanced osteoarthritis.  Over the past 3 months, she has lost 7 pounds in weight.  Her last hemoglobin A1c was improved and down to 6.3 from a previous value of 7.2.  She feels quite well.  Today, and a random blood sugar 100 this morning. due to arthritis and her age.  Her activities are quite limited her hypertension has been well controlled  Allergies: 1)  ! Codeine 2)  ! * Fiorinal  Past History:  Past Medical History: Reviewed history from 01/31/2007 and no changes required. Diabetes mellitus, type II Hyperlipidemia Hypertension Osteoarthritis Anxiety Headache  Family History: Reviewed history from 08/01/2007 and no changes required. Family History Breast cancer 1st degree relative <50 Family History Other cancer Family History of Cardiovascular disorder father died in his 84s from a motor vehicle accident mother died in her 34s secondary to burns  Six brothers positive for coronary artery disease 5 sisters positive for breast cancer  Social History: Reviewed history from 01/31/2007 and no changes required. Retired Single Current Smoker  Review of Systems       The patient complains of weight loss and difficulty walking.  The patient denies anorexia, fever, weight gain, vision loss, decreased hearing, hoarseness, chest pain, syncope, dyspnea on exertion, peripheral edema, prolonged cough, headaches, hemoptysis, abdominal pain,  melena, hematochezia, severe indigestion/heartburn, hematuria, incontinence, genital sores, muscle weakness, suspicious skin lesions, transient blindness, depression, unusual weight change, abnormal bleeding, enlarged lymph nodes, angioedema, and breast masses.    Physical Exam  General:  Well-developed,well-nourished,in no acute distress; alert,appropriate and cooperative throughout examination Head:  Normocephalic and atraumatic without obvious abnormalities. No apparent alopecia or balding. Eyes:  No corneal or conjunctival inflammation noted. EOMI. Perrla. Funduscopic exam benign, without hemorrhages, exudates or papilledema. Vision grossly normal. Mouth:  Oral mucosa and oropharynx without lesions or exudates.  Teeth in good repair. Neck:  No deformities, masses, or tenderness noted. Lungs:  Normal respiratory effort, chest expands symmetrically. Lungs are clear to auscultation, no crackles or wheezes. Heart:  Normal rate and regular rhythm. S1 and S2 normal without gallop, murmur, click, rub or other extra sounds. Abdomen:  Bowel sounds positive,abdomen soft and non-tender without masses, organomegaly or hernias noted. Msk:  No deformity or scoliosis noted of thoracic or lumbar spine.   Extremities:  No clubbing, cyanosis, edema, or deformity noted with normal full range of motion of all joints.   Skin:  Intact without suspicious lesions or rashes Cervical Nodes:  No lymphadenopathy noted Axillary Nodes:  No palpable lymphadenopathy   Impression & Recommendations:  Problem # 1:  OSTEOARTHRITIS (ICD-715.90)  Her updated medication list for this problem includes:    Sedapap 50-650 Mg Tabs (Butalbital-acetaminophen) .Marland Kitchen... 1 two times a day as needed  Her updated medication list for this problem includes:    Sedapap  50-650 Mg Tabs (Butalbital-acetaminophen) .Marland Kitchen... 1 two times a day as needed  Problem # 2:  HYPERTENSION (ICD-401.9)  Her updated medication list for this problem  includes:    Verapamil Hcl Cr 180 Mg Tbcr (Verapamil hcl) .Marland Kitchen... 1 once daily    Hydrochlorothiazide 25 Mg Tabs (Hydrochlorothiazide) .Marland Kitchen... 1 once daily  Her updated medication list for this problem includes:    Verapamil Hcl Cr 180 Mg Tbcr (Verapamil hcl) .Marland Kitchen... 1 once daily    Hydrochlorothiazide 25 Mg Tabs (Hydrochlorothiazide) .Marland Kitchen... 1 once daily  Problem # 3:  DIABETES MELLITUS, TYPE II (ICD-250.00)  Her updated medication list for this problem includes:    Glipizide 5 Mg Tb24 (Glipizide) .Marland Kitchen... 1 once daily  Orders: Capillary Blood Glucose/CBG (78295) Venipuncture (62130) TLB-A1C / Hgb A1C (Glycohemoglobin) (83036-A1C)  Her updated medication list for this problem includes:    Glipizide 5 Mg Tb24 (Glipizide) .Marland Kitchen... 1 once daily  Complete Medication List: 1)  Valium 5 Mg Tabs (Diazepam) .Marland Kitchen.. 1 two times a day as needed 2)  Sedapap 50-650 Mg Tabs (Butalbital-acetaminophen) .Marland Kitchen.. 1 two times a day as needed 3)  Verapamil Hcl Cr 180 Mg Tbcr (Verapamil hcl) .Marland Kitchen.. 1 once daily 4)  Glipizide 5 Mg Tb24 (Glipizide) .Marland Kitchen.. 1 once daily 5)  Hydrochlorothiazide 25 Mg Tabs (Hydrochlorothiazide) .Marland Kitchen.. 1 once daily 6)  K-lor 20 Meq Pack (Potassium chloride) .Marland Kitchen.. 1 two times a day 7)  Bd Ultra-fine 33 Lancets Misc (Lancets) .... Use three times a day as needed 8)  Prestige Test Strp (Glucose blood) .... Three times a day as directed  Other Orders: Specimen Handling (86578)  Patient Instructions: 1)  Please schedule a follow-up appointment in 3 months. 2)  Limit your Sodium (Salt). 3)  Check your blood sugars regularly. If your readings are usually above : or below 70 you should contact our office. 4)  It is important that your Diabetic A1c level is checked every 3 months. Prescriptions: PRESTIGE TEST  STRP (GLUCOSE BLOOD) three times a day as directed  #90 day x 4   Entered and Authorized by:   Gordy Savers  MD   Signed by:   Gordy Savers  MD on 08/22/2010   Method used:   Print  then Give to Patient   RxID:   [id number] BD ULTRA-FINE 33 LANCETS  MISC (LANCETS) use three times a day as needed  #100 Each x 5   Entered and Authorized by:   Gordy Savers  MD   Signed by:   Gordy Savers  MD on 08/22/2010   Method used:   Print then Give to Patient   RxID:   [id number] K-LOR 20 MEQ  PACK (POTASSIUM CHLORIDE) 1 two times a day  #180 x 6   Entered and Authorized by:   Gordy Savers  MD   Signed by:   Gordy Savers  MD on 08/22/2010   Method used:   Print then Give to Patient   RxID:   [id number] HYDROCHLOROTHIAZIDE 25 MG  TABS (HYDROCHLOROTHIAZIDE) 1 once daily  #90 x 6   Entered and Authorized by:   Gordy Savers  MD   Signed by:   Gordy Savers  MD on 08/22/2010   Method used:   Print then Give to Patient   RxID:   [id number] GLIPIZIDE 5 MG  TB24 (GLIPIZIDE) 1 once daily  #90 x 6   Entered and Authorized by:   Gordy Savers  MD  Signed by:   Gordy Savers  MD on 08/22/2010   Method used:   Print then Give to Patient   RxID:   [id number] VERAPAMIL HCL CR 180 MG  TBCR (VERAPAMIL HCL) 1 once daily  #90 x 6   Entered and Authorized by:   Gordy Savers  MD   Signed by:   Gordy Savers  MD on 08/22/2010   Method used:   Print then Give to Patient   RxID:   [id number] SEDAPAP 50-650 MG  TABS (BUTALBITAL-ACETAMINOPHEN) 1 two times a day as needed  #60 x 2   Entered and Authorized by:   Gordy Savers  MD   Signed by:   Gordy Savers  MD on 08/22/2010   Method used:   Print then Give to Patient   RxID:   [id number] VALIUM 5 MG  TABS (DIAZEPAM) 1 two times a day as needed  #60 x 2   Entered and Authorized by:   Gordy Savers  MD   Signed by:   Gordy Savers  MD on 08/22/2010   Method used:   Print then Give to Patient   RxID:   [id number]    Orders Added: 1)  Capillary Blood Glucose/CBG [82948] 2)  Venipuncture [88416] 3)  TLB-A1C /  Hgb A1C (Glycohemoglobin) [83036-A1C] 4)  Est. Patient Level IV [60630] 5)  Specimen Handling [99000]

## 2010-08-28 NOTE — Assessment & Plan Note (Signed)
Summary: 3 month fup//ccm   Vital Signs:  Patient profile:   77 year old female Weight:      140 pounds Temp:     98.7 degrees F oral BP sitting:   154 / 76  (left arm) Cuff size:   regular  Vitals Entered By: Alfred Levins, CMA (May 23, 2010 8:45 AM) CC: f/u   CC:  f/u.  History of Present Illness: 37 -year-old is follow-up for hypertension, and osteoarthritis, and type 2 diabetes.  She has done quite well continues to have considerable knee and ankle pain that limits her activity and.  She does monitor her blood sugars at home with reasonable results.  Her last hemoglobin A1c was 7.2.  She has history of mild dyslipidemia.  Her only complaint is arthritic.  She has treated hypertension well controlled on verapamil and diuretic therapy.  She is on potassium supplementation.  She has a history of headaches, but, no complaints  today.  She has chronic anxiety  Current Medications (verified): 1)  Valium 5 Mg  Tabs (Diazepam) .Marland Kitchen.. 1 Two Times A Day As Needed 2)  Sedapap 50-650 Mg  Tabs (Butalbital-Acetaminophen) .Marland Kitchen.. 1 Two Times A Day As Needed 3)  Verapamil Hcl Cr 180 Mg  Tbcr (Verapamil Hcl) .Marland Kitchen.. 1 Once Daily 4)  Glipizide 5 Mg  Tb24 (Glipizide) .Marland Kitchen.. 1 Once Daily 5)  Hydrochlorothiazide 25 Mg  Tabs (Hydrochlorothiazide) .Marland Kitchen.. 1 Once Daily 6)  K-Lor 20 Meq  Pack (Potassium Chloride) .Marland Kitchen.. 1 Two Times A Day 7)  Bd Ultra-Fine 33 Lancets  Misc (Lancets) .... Use Three Times A Day As Needed 8)  Prestige Test  Strp (Glucose Blood) .... Three Times A Day As Directed  Allergies (verified): 1)  ! Codeine 2)  ! * Fiorinal  Past History:  Past Medical History: Reviewed history from 01/31/2007 and no changes required. Diabetes mellitus, type II Hyperlipidemia Hypertension Osteoarthritis Anxiety Headache  Past Surgical History: Reviewed history from 01/31/2007 and no changes required. Cataract extraction Hysterectomy  Family History: Reviewed history from 08/01/2007  and no changes required. Family History Breast cancer 1st degree relative <50 Family History Other cancer Family History of Cardiovascular disorder father died in his 31s from a motor vehicle accident mother died in her 72s secondary to burns  Six brothers positive for coronary artery disease 5 sisters positive for breast cancer  Social History: Reviewed history from 01/31/2007 and no changes required. Retired Single Current Smoker  Review of Systems       The patient complains of muscle weakness and difficulty walking.  The patient denies anorexia, fever, weight loss, weight gain, vision loss, decreased hearing, hoarseness, chest pain, syncope, dyspnea on exertion, peripheral edema, prolonged cough, headaches, hemoptysis, abdominal pain, melena, hematochezia, severe indigestion/heartburn, hematuria, incontinence, genital sores, suspicious skin lesions, transient blindness, depression, unusual weight change, abnormal bleeding, enlarged lymph nodes, angioedema, and breast masses.    Physical Exam  General:  overweight-appearing.  120/60overweight-appearing.   Head:  Normocephalic and atraumatic without obvious abnormalities. No apparent alopecia or balding. Ears:  External ear exam shows no significant lesions or deformities.  Otoscopic examination reveals clear canals, tympanic membranes are intact bilaterally without bulging, retraction, inflammation or discharge. Hearing is grossly normal bilaterally. Mouth:  Oral mucosa and oropharynx without lesions or exudates.    Neck:  No deformities, masses, or tenderness noted. Chest Wall:  No deformities, masses, or tenderness noted. Lungs:  Normal respiratory effort, chest expands symmetrically. Lungs are clear to auscultation, no crackles  or wheezes. Heart:  Normal rate and regular rhythm. S1 and S2 normal without gallop, murmur, click, rub or other extra sounds. Abdomen:  Bowel sounds positive,abdomen soft and non-tender without masses,  organomegaly or hernias noted. Msk:  No deformity or scoliosis noted of thoracic or lumbar spine.   Extremities:  No clubbing, cyanosis, edema, or deformity noted with normal full range of motion of all joints.   Skin:  Intact without suspicious lesions or rashes Cervical Nodes:  No lymphadenopathy noted Psych:  Cognition and judgment appear intact. Alert and cooperative with normal attention span and concentration. No apparent delusions, illusions, hallucinations   Impression & Recommendations:  Problem # 1:  OSTEOARTHRITIS (ICD-715.90)  Her updated medication list for this problem includes:    Sedapap 50-650 Mg Tabs (Butalbital-acetaminophen) .Marland Kitchen... 1 two times a day as needed  Her updated medication list for this problem includes:    Sedapap 50-650 Mg Tabs (Butalbital-acetaminophen) .Marland Kitchen... 1 two times a day as needed  Problem # 2:  HYPERTENSION (ICD-401.9)  Her updated medication list for this problem includes:    Verapamil Hcl Cr 180 Mg Tbcr (Verapamil hcl) .Marland Kitchen... 1 once daily    Hydrochlorothiazide 25 Mg Tabs (Hydrochlorothiazide) .Marland Kitchen... 1 once daily  Her updated medication list for this problem includes:    Verapamil Hcl Cr 180 Mg Tbcr (Verapamil hcl) .Marland Kitchen... 1 once daily    Hydrochlorothiazide 25 Mg Tabs (Hydrochlorothiazide) .Marland Kitchen... 1 once daily  Problem # 3:  DIABETES MELLITUS, TYPE II (ICD-250.00)  Her updated medication list for this problem includes:    Glipizide 5 Mg Tb24 (Glipizide) .Marland Kitchen... 1 once daily    Her updated medication list for this problem includes:    Glipizide 5 Mg Tb24 (Glipizide) .Marland Kitchen... 1 once daily  Orders: Venipuncture (01027) TLB-A1C / Hgb A1C (Glycohemoglobin) (83036-A1C) Specimen Handling (25366)  Complete Medication List: 1)  Valium 5 Mg Tabs (Diazepam) .Marland Kitchen.. 1 two times a day as needed 2)  Sedapap 50-650 Mg Tabs (Butalbital-acetaminophen) .Marland Kitchen.. 1 two times a day as needed 3)  Verapamil Hcl Cr 180 Mg Tbcr (Verapamil hcl) .Marland Kitchen.. 1 once daily 4)   Glipizide 5 Mg Tb24 (Glipizide) .Marland Kitchen.. 1 once daily 5)  Hydrochlorothiazide 25 Mg Tabs (Hydrochlorothiazide) .Marland Kitchen.. 1 once daily 6)  K-lor 20 Meq Pack (Potassium chloride) .Marland Kitchen.. 1 two times a day 7)  Bd Ultra-fine 33 Lancets Misc (Lancets) .... Use three times a day as needed 8)  Prestige Test Strp (Glucose blood) .... Three times a day as directed  Patient Instructions: 1)  Please schedule a follow-up appointment in 3 months. 2)  Limit your Sodium (Salt). 3)  It is important that you exercise regularly at least 20 minutes 5 times a week. If you develop chest pain, have severe difficulty breathing, or feel very tired , stop exercising immediately and seek medical attention. 4)  You need to lose weight. Consider a lower calorie diet and regular exercise.  5)  Take calcium +Vitamin D daily. 6)  Check your blood sugars regularly. If your readings are usually above : or below 70 you should contact our office. 7)  It is important that your Diabetic A1c level is checked every 3 months. 8)  See your eye doctor yearly to check for diabetic eye damage. Prescriptions: PRESTIGE TEST  STRP (GLUCOSE BLOOD) three times a day as directed  #90 day x 4   Entered and Authorized by:   Gordy Savers  MD   Signed by:   Gordy Savers  MD on 05/23/2010   Method used:   Print then Give to Patient   RxID:   [id number] BD ULTRA-FINE 33 LANCETS  MISC (LANCETS) use three times a day as needed  #100 Each x 5   Entered and Authorized by:   Gordy Savers  MD   Signed by:   Gordy Savers  MD on 05/23/2010   Method used:   Print then Give to Patient   RxID:   [id number] K-LOR 20 MEQ  PACK (POTASSIUM CHLORIDE) 1 two times a day  #180 x 6   Entered and Authorized by:   Gordy Savers  MD   Signed by:   Gordy Savers  MD on 05/23/2010   Method used:   Print then Give to Patient   RxID:   [id number] HYDROCHLOROTHIAZIDE 25 MG  TABS (HYDROCHLOROTHIAZIDE) 1 once daily  #90 x  6   Entered and Authorized by:   Gordy Savers  MD   Signed by:   Gordy Savers  MD on 05/23/2010   Method used:   Print then Give to Patient   RxID:   [id number] GLIPIZIDE 5 MG  TB24 (GLIPIZIDE) 1 once daily  #90 x 6   Entered and Authorized by:   Gordy Savers  MD   Signed by:   Gordy Savers  MD on 05/23/2010   Method used:   Print then Give to Patient   RxID:   [id number] VERAPAMIL HCL CR 180 MG  TBCR (VERAPAMIL HCL) 1 once daily  #90 x 6   Entered and Authorized by:   Gordy Savers  MD   Signed by:   Gordy Savers  MD on 05/23/2010   Method used:   Print then Give to Patient   RxID:   [id number] SEDAPAP 50-650 MG  TABS (BUTALBITAL-ACETAMINOPHEN) 1 two times a day as needed  #60 x 2   Entered and Authorized by:   Gordy Savers  MD   Signed by:   Gordy Savers  MD on 05/23/2010   Method used:   Print then Give to Patient   RxID:   [id number] VALIUM 5 MG  TABS (DIAZEPAM) 1 two times a day as needed  #60 x 2   Entered and Authorized by:   Gordy Savers  MD   Signed by:   Gordy Savers  MD on 05/23/2010   Method used:   Print then Give to Patient   RxID:   [id number]    Orders Added: 1)  Est. Patient Level IV [69678] 2)  Venipuncture [93810] 3)  TLB-A1C / Hgb A1C (Glycohemoglobin) [83036-A1C] 4)  Specimen Handling [99000]

## 2010-11-14 ENCOUNTER — Ambulatory Visit: Payer: Self-pay | Admitting: Internal Medicine

## 2010-11-21 ENCOUNTER — Encounter: Payer: Self-pay | Admitting: Internal Medicine

## 2010-11-24 ENCOUNTER — Encounter: Payer: Self-pay | Admitting: Internal Medicine

## 2010-11-24 ENCOUNTER — Ambulatory Visit (INDEPENDENT_AMBULATORY_CARE_PROVIDER_SITE_OTHER): Payer: Medicare Other | Admitting: Internal Medicine

## 2010-11-24 DIAGNOSIS — E785 Hyperlipidemia, unspecified: Secondary | ICD-10-CM

## 2010-11-24 DIAGNOSIS — I10 Essential (primary) hypertension: Secondary | ICD-10-CM

## 2010-11-24 DIAGNOSIS — E119 Type 2 diabetes mellitus without complications: Secondary | ICD-10-CM

## 2010-11-24 LAB — HEPATIC FUNCTION PANEL
ALT: 15 U/L (ref 0–35)
Albumin: 3.5 g/dL (ref 3.5–5.2)
Alkaline Phosphatase: 56 U/L (ref 39–117)
Bilirubin, Direct: 0 mg/dL (ref 0.0–0.3)

## 2010-11-24 LAB — CBC WITH DIFFERENTIAL/PLATELET
Basophils Absolute: 0 10*3/uL (ref 0.0–0.1)
Eosinophils Absolute: 0.1 10*3/uL (ref 0.0–0.7)
Eosinophils Relative: 3.5 % (ref 0.0–5.0)
HCT: 36.7 % (ref 36.0–46.0)
Hemoglobin: 12.3 g/dL (ref 12.0–15.0)
Lymphocytes Relative: 42.5 % (ref 12.0–46.0)
MCHC: 33.5 g/dL (ref 30.0–36.0)
MCV: 79.7 fl (ref 78.0–100.0)
Monocytes Absolute: 0.4 10*3/uL (ref 0.1–1.0)
Neutro Abs: 1 10*3/uL — ABNORMAL LOW (ref 1.4–7.7)
Platelets: 116 10*3/uL — ABNORMAL LOW (ref 150.0–400.0)
RBC: 4.61 Mil/uL (ref 3.87–5.11)
WBC: 2.7 10*3/uL — ABNORMAL LOW (ref 4.5–10.5)

## 2010-11-24 LAB — BASIC METABOLIC PANEL
BUN: 39 mg/dL — ABNORMAL HIGH (ref 6–23)
Calcium: 8.8 mg/dL (ref 8.4–10.5)
Creatinine, Ser: 0.9 mg/dL (ref 0.4–1.2)
Glucose, Bld: 84 mg/dL (ref 70–99)
Sodium: 137 mEq/L (ref 135–145)

## 2010-11-24 MED ORDER — METFORMIN HCL ER 500 MG PO TB24: 500.0000 mg | ORAL_TABLET | Freq: Every day | ORAL | Status: DC

## 2010-11-24 MED ORDER — HYDROCHLOROTHIAZIDE 25 MG PO TABS: 25.0000 mg | ORAL_TABLET | Freq: Every day | ORAL | Status: DC

## 2010-11-24 MED ORDER — DIAZEPAM 5 MG PO TABS: 5.0000 mg | ORAL_TABLET | Freq: Two times a day (BID) | ORAL | Status: DC | PRN

## 2010-11-24 MED ORDER — POTASSIUM CHLORIDE CRYS ER 20 MEQ PO TBCR: 20.0000 meq | EXTENDED_RELEASE_TABLET | Freq: Two times a day (BID) | ORAL | Status: DC

## 2010-11-24 MED ORDER — VERAPAMIL HCL ER 180 MG PO TBCR: 180.0000 mg | EXTENDED_RELEASE_TABLET | Freq: Every day | ORAL | Status: DC

## 2010-11-24 MED ORDER — GLIPIZIDE ER 5 MG PO TB24: 5.0000 mg | ORAL_TABLET | Freq: Every day | ORAL | Status: DC

## 2010-11-24 MED ORDER — BUTALBITAL-ACETAMINOPHEN 50-650 MG PO TABS: 1.0000 mg | ORAL_TABLET | Freq: Two times a day (BID) | ORAL | Status: DC | PRN

## 2010-11-24 NOTE — Progress Notes (Signed)
  Subjective:    Patient ID: Christie [name], female    DOB: [birth year], 77 y.o.   MRN: [id number]  HPI An 77 year old patient who is seen today for followup. She has a history of type 2 diabetes which has been well controlled. Her last hemoglobin A1c was 6.3. A random blood sugar this morning 120. Denies any hypoglycemia. She has a history of hypertension that has been controlled on diuretic therapy as well as her abdomen L. She has a history of hypokalemia and is on potassium supplementation. No recent lab. She has advanced osteoarthritis but does manage fairly well with the use of a walker. She requires chronic analgesics. She is to come to by her daughter who feels that she is managing pretty well.   Review of Systems  Constitutional: Negative.   HENT: Negative for hearing loss, congestion, sore throat, rhinorrhea, dental problem, sinus pressure and tinnitus.   Eyes: Negative for pain, discharge and visual disturbance.  Respiratory: Negative for cough and shortness of breath.   Cardiovascular: Negative for chest pain, palpitations and leg swelling.  Gastrointestinal: Negative for nausea, vomiting, abdominal pain, diarrhea, constipation, blood in stool and abdominal distention.  Genitourinary: Negative for dysuria, urgency, frequency, hematuria, flank pain, vaginal bleeding, vaginal discharge, difficulty urinating, vaginal pain and pelvic pain.  Musculoskeletal: Positive for back pain, arthralgias and gait problem. Negative for joint swelling.  Skin: Negative for rash.  Neurological: Negative for dizziness, syncope, speech difficulty, weakness, numbness and headaches.  Hematological: Negative for adenopathy.  Psychiatric/Behavioral: Negative for behavioral problems, dysphoric mood and agitation. The patient is not nervous/anxious.        Objective:   Physical Exam  Constitutional: She is oriented to person, place, and time. She appears well-developed and well-nourished.       Elderly  sitting in a wheelchair. Blood pressure low normal  HENT:  Head: Normocephalic.  Right Ear: External ear normal.  Left Ear: External ear normal.  Mouth/Throat: Oropharynx is clear and moist.  Eyes: Conjunctivae and EOM are normal. Pupils are equal, round, and reactive to light.  Neck: Normal range of motion. Neck supple. No thyromegaly present.  Cardiovascular: Normal rate, regular rhythm, normal heart sounds and intact distal pulses.   Pulmonary/Chest: Effort normal and breath sounds normal. No respiratory distress. She has no wheezes. She has no rales.  Abdominal: Soft. Bowel sounds are normal. She exhibits no mass. There is no tenderness.  Musculoskeletal: Normal range of motion.       Trace pedal edema  Lymphadenopathy:    She has no cervical adenopathy.  Neurological: She is alert and oriented to person, place, and time.  Skin: Skin is warm and dry. No rash noted.  Psychiatric: She has a normal mood and affect. Her behavior is normal.          Assessment & Plan:   Diabetes mellitus. We'll check a hemoglobin A1c. We'll continue present regimen Hypertension. Well controlled history of hypokalemia. Will check electrolytes today Osteoarthritis. Medications refilled  We'll recheck in 3 months

## 2010-11-24 NOTE — Patient Instructions (Signed)
Limit your sodium (Salt) intake  Return in 3 months for follow-up   

## 2010-12-12 ENCOUNTER — Other Ambulatory Visit: Payer: Self-pay | Admitting: Internal Medicine

## 2011-02-17 ENCOUNTER — Encounter: Payer: Self-pay | Admitting: Internal Medicine

## 2011-02-17 ENCOUNTER — Ambulatory Visit (INDEPENDENT_AMBULATORY_CARE_PROVIDER_SITE_OTHER): Payer: Medicare Other | Admitting: Internal Medicine

## 2011-02-17 DIAGNOSIS — E785 Hyperlipidemia, unspecified: Secondary | ICD-10-CM

## 2011-02-17 DIAGNOSIS — M1990 Unspecified osteoarthritis, unspecified site: Secondary | ICD-10-CM

## 2011-02-17 DIAGNOSIS — E119 Type 2 diabetes mellitus without complications: Secondary | ICD-10-CM

## 2011-02-17 NOTE — Progress Notes (Signed)
  Subjective:    Patient ID: Christie [name], female    DOB: 1920-07-31, 77 y.o.   MRN: [id number]  HPI  77 year old patient who has a history of osteoarthritis. The patient is scheduled for followup next week. Her daughter is here to discuss her medications. The patient has been restricted to 60 Sedepap  and 60 Valium per month.  The patient has had difficulties with dose escalation in the past. Medications have been refilled only at time of her office visit that she has been calling for early refills. Her daughter states that she takes her medications in excess and runs out early. She refuses to allow her daughter or other family members to dispense her medication. Her daughter is concerned about drug overdose. The patient has called the police on 2 occasions when she couldn't get her medications from her daughter as requested. The daughter feels that it is in her best interest to further limit her medications to 30 each per month.    Review of Systems     Objective:   Physical Exam        Assessment & Plan:   Osteoarthritis Chronic anxiety with drug seeking behavior. The patient is scheduled for followup next month these issues will be addressed

## 2011-02-23 ENCOUNTER — Encounter: Payer: Self-pay | Admitting: Internal Medicine

## 2011-02-23 ENCOUNTER — Ambulatory Visit (INDEPENDENT_AMBULATORY_CARE_PROVIDER_SITE_OTHER): Payer: Medicare Other | Admitting: Internal Medicine

## 2011-02-23 DIAGNOSIS — E119 Type 2 diabetes mellitus without complications: Secondary | ICD-10-CM

## 2011-02-23 DIAGNOSIS — F411 Generalized anxiety disorder: Secondary | ICD-10-CM

## 2011-02-23 DIAGNOSIS — I10 Essential (primary) hypertension: Secondary | ICD-10-CM

## 2011-02-23 DIAGNOSIS — M1990 Unspecified osteoarthritis, unspecified site: Secondary | ICD-10-CM

## 2011-02-23 LAB — HEMOGLOBIN A1C: Hgb A1c MFr Bld: 7.2 % — ABNORMAL HIGH (ref 4.6–6.5)

## 2011-02-23 MED ORDER — VERAPAMIL HCL 180 MG PO TBCR: 180.0000 mg | EXTENDED_RELEASE_TABLET | Freq: Every day | ORAL | Status: DC

## 2011-02-23 MED ORDER — BUTALBITAL-ACETAMINOPHEN 50-650 MG PO TABS: 1.0000 mg | ORAL_TABLET | Freq: Two times a day (BID) | ORAL | Status: DC | PRN

## 2011-02-23 MED ORDER — DIAZEPAM 5 MG PO TABS: 5.0000 mg | ORAL_TABLET | Freq: Two times a day (BID) | ORAL | Status: DC | PRN

## 2011-02-23 MED ORDER — POTASSIUM CHLORIDE CRYS ER 20 MEQ PO TBCR: 20.0000 meq | EXTENDED_RELEASE_TABLET | Freq: Two times a day (BID) | ORAL | Status: DC

## 2011-02-23 MED ORDER — GLIPIZIDE ER 5 MG PO TB24: 5.0000 mg | ORAL_TABLET | Freq: Every day | ORAL | Status: DC

## 2011-02-23 MED ORDER — METFORMIN HCL ER 500 MG PO TB24: 500.0000 mg | ORAL_TABLET | Freq: Every day | ORAL | Status: DC

## 2011-02-23 NOTE — Progress Notes (Signed)
  Subjective:    Patient ID: Christie [name], female    DOB: 12/07/1920, 77 y.o.   MRN: [id number]  HPI  Wt Readings from Last 3 Encounters:  02/23/11 135 lb (61.236 kg)  11/24/10 143 lb (64.864 kg)  08/22/10 133 lb (60.328 kg)    An 77 year old patient who is seen today accompanied by her daughter. Her daughter was seen last week for a conference concerning her mother who was concerned about the patient overdosing on her medications. These have been limited to 60 Valium per month as well as 60% at that per month. Her daughter states that she runs out early and refuses to allow her to dispense the medication. Her diabetes has been stable. Her hemoglobin A1c has improved from 8-7 over the past 3 months. No hypoglycemia She has significant knee osteoarthritis She has hypertension which has been well controlled on diuretic therapy  Review of Systems  Constitutional: Negative.   HENT: Negative for hearing loss, congestion, sore throat, rhinorrhea, dental problem, sinus pressure and tinnitus.   Eyes: Negative for pain, discharge and visual disturbance.  Respiratory: Negative for cough and shortness of breath.   Cardiovascular: Negative for chest pain, palpitations and leg swelling.  Gastrointestinal: Negative for nausea, vomiting, abdominal pain, diarrhea, constipation, blood in stool and abdominal distention.  Genitourinary: Negative for dysuria, urgency, frequency, hematuria, flank pain, vaginal bleeding, vaginal discharge, difficulty urinating, vaginal pain and pelvic pain.  Musculoskeletal: Positive for gait problem. Negative for joint swelling and arthralgias.  Skin: Negative for rash.  Neurological: Negative for dizziness, syncope, speech difficulty, weakness, numbness and headaches.  Hematological: Negative for adenopathy.  Psychiatric/Behavioral: Negative for behavioral problems, dysphoric mood and agitation. The patient is nervous/anxious.        Objective:   Physical Exam    Constitutional: She is oriented to person, place, and time. She appears well-developed and well-nourished.  HENT:  Head: Normocephalic.  Right Ear: External ear normal.  Left Ear: External ear normal.  Mouth/Throat: Oropharynx is clear and moist.  Eyes: Conjunctivae and EOM are normal. Pupils are equal, round, and reactive to light.  Neck: Normal range of motion. Neck supple. No thyromegaly present.  Cardiovascular: Normal rate, regular rhythm, normal heart sounds and intact distal pulses.   Pulmonary/Chest: Effort normal and breath sounds normal.  Abdominal: Soft. Bowel sounds are normal. She exhibits no mass. There is no tenderness.  Musculoskeletal: Normal range of motion.  Lymphadenopathy:    She has no cervical adenopathy.  Neurological: She is alert and oriented to person, place, and time.  Skin: Skin is warm and dry. No rash noted.  Psychiatric: She has a normal mood and affect. Her behavior is normal.          Assessment & Plan:   Hypertension. Well controlled Diabetes mellitus. We'll check a hemoglobin A1c Osteoarthritis.  We'll refill Sedapap  and Valium. Recheck in 3 months

## 2011-02-23 NOTE — Patient Instructions (Signed)
Please check your hemoglobin A1c every 3 months  Limit your sodium (Salt) intake   

## 2011-04-09 ENCOUNTER — Telehealth: Payer: Self-pay | Admitting: Internal Medicine

## 2011-04-09 MED ORDER — SEDAPAP 50-650 MG PO TABS: 1.0000 mg | ORAL_TABLET | Freq: Two times a day (BID) | ORAL | Status: DC | PRN

## 2011-04-09 NOTE — Telephone Encounter (Signed)
Med list change for DAW and re faxed to [facility]

## 2011-04-09 NOTE — Telephone Encounter (Signed)
Pt req the Brand Name Only for (SEDAPAP) 50-650. Pls call in to [facility].

## 2011-04-10 ENCOUNTER — Telehealth: Payer: Self-pay | Admitting: Internal Medicine

## 2011-04-10 NOTE — Telephone Encounter (Signed)
Valium was last written 7/30 at office visit #30 3Rf 1 po q12hr prn Please advise It use to be written for #60

## 2011-04-10 NOTE — Telephone Encounter (Signed)
Pt called regarding diazepam (VALIUM) 5 MG tablet. Pt would like to know why the quantity had changed and is requesting additional pills because she is out. Please contact

## 2011-04-10 NOTE — Telephone Encounter (Signed)
No change in prescription which will last until next office visit

## 2011-04-10 NOTE — Telephone Encounter (Signed)
Please advise 

## 2011-04-10 NOTE — Telephone Encounter (Signed)
Pt called and said that [facility] is still giving pt the Generic for Sedapap. I checked pts med list and saw that the script for Brand Name Only Sedapap was sent to the pharmacy. I contacted the pharmacy and was told that Brand Name Sedapap is no longer available, only the generic. Pt does not like the generic and is req a Brand Name med that would be an alternative.

## 2011-04-10 NOTE — Telephone Encounter (Signed)
Spoke with pt and daughter - explianed about rx's - will call [facility] and get sedapap alternative -  Spoke with pharmacy - will disp generic - was filled 2 days ago. Also informed [facility] that dr. Amador Cunas will not change quanity on valium. KIK

## 2011-05-20 ENCOUNTER — Ambulatory Visit (INDEPENDENT_AMBULATORY_CARE_PROVIDER_SITE_OTHER): Payer: Medicare Other | Admitting: Internal Medicine

## 2011-05-20 ENCOUNTER — Encounter: Payer: Self-pay | Admitting: Internal Medicine

## 2011-05-20 DIAGNOSIS — E119 Type 2 diabetes mellitus without complications: Secondary | ICD-10-CM

## 2011-05-20 DIAGNOSIS — E785 Hyperlipidemia, unspecified: Secondary | ICD-10-CM

## 2011-05-20 DIAGNOSIS — F411 Generalized anxiety disorder: Secondary | ICD-10-CM

## 2011-05-20 DIAGNOSIS — M1990 Unspecified osteoarthritis, unspecified site: Secondary | ICD-10-CM

## 2011-05-20 DIAGNOSIS — I10 Essential (primary) hypertension: Secondary | ICD-10-CM

## 2011-05-20 LAB — BASIC METABOLIC PANEL
BUN: 25 mg/dL — ABNORMAL HIGH (ref 6–23)
CO2: 25 mEq/L (ref 19–32)
Chloride: 109 mEq/L (ref 96–112)
Potassium: 4.1 mEq/L (ref 3.5–5.1)

## 2011-05-20 LAB — CBC WITH DIFFERENTIAL/PLATELET
Basophils Relative: 0.6 % (ref 0.0–3.0)
Eosinophils Relative: 0.7 % (ref 0.0–5.0)
HCT: 38.5 % (ref 36.0–46.0)
Lymphs Abs: 1.3 10*3/uL (ref 0.7–4.0)
MCV: 82.4 fl (ref 78.0–100.0)
Monocytes Absolute: 0.5 10*3/uL (ref 0.1–1.0)
Platelets: 130 10*3/uL — ABNORMAL LOW (ref 150.0–400.0)
WBC: 4.6 10*3/uL (ref 4.5–10.5)

## 2011-05-20 MED ORDER — SEDAPAP 50-650 MG PO TABS: 1.0000 mg | ORAL_TABLET | Freq: Two times a day (BID) | ORAL | Status: DC | PRN

## 2011-05-20 MED ORDER — DIAZEPAM 5 MG PO TABS: 5.0000 mg | ORAL_TABLET | Freq: Two times a day (BID) | ORAL | Status: DC | PRN

## 2011-05-20 NOTE — Patient Instructions (Signed)
Limit your sodium (Salt) intake   Please check your hemoglobin A1c every 3 months   

## 2011-05-20 NOTE — Progress Notes (Signed)
  Subjective:    Patient ID: Christie [name], female    DOB: October 30, 1920, 77 y.o.   MRN: [id number]  HPI  an 77 year old patient who is in today for followup for type 2 diabetes. She states that she checks blood sugars at home approximately twice per week with blood sugars in the 140 range. Her last hemoglobin A1c 7.2 she is doing quite well and denies any hypoglycemia. She has treated hypertension which has been stable on a regimen of verapamil and hydrochlorothiazide. She does have a history of hypokalemia and is also on potassium supplementation She has osteoarthritis which has been controlled with sedapap. She also has anxiety and takes Valium when necessary.    Review of Systems  Constitutional: Negative.   HENT: Negative for hearing loss, congestion, sore throat, rhinorrhea, dental problem, sinus pressure and tinnitus.   Eyes: Negative for pain, discharge and visual disturbance.  Respiratory: Negative for cough and shortness of breath.   Cardiovascular: Negative for chest pain, palpitations and leg swelling.  Gastrointestinal: Negative for nausea, vomiting, abdominal pain, diarrhea, constipation, blood in stool and abdominal distention.  Genitourinary: Negative for dysuria, urgency, frequency, hematuria, flank pain, vaginal bleeding, vaginal discharge, difficulty urinating, vaginal pain and pelvic pain.  Musculoskeletal: Positive for back pain, arthralgias and gait problem. Negative for joint swelling.  Skin: Negative for rash.  Neurological: Negative for dizziness, syncope, speech difficulty, weakness, numbness and headaches.  Hematological: Negative for adenopathy.  Psychiatric/Behavioral: Negative for behavioral problems, dysphoric mood and agitation. The patient is nervous/anxious.        Objective:   Physical Exam  Constitutional: She is oriented to person, place, and time. She appears well-developed and well-nourished. She appears distressed.       Overweight. Blood pressure  110/68  HENT:  Head: Normocephalic.  Right Ear: External ear normal.  Left Ear: External ear normal.  Mouth/Throat: Oropharynx is clear and moist.  Eyes: Conjunctivae and EOM are normal. Pupils are equal, round, and reactive to light.  Neck: Normal range of motion. Neck supple. No thyromegaly present.  Cardiovascular: Normal rate, regular rhythm, normal heart sounds and intact distal pulses.   Pulmonary/Chest: Effort normal and breath sounds normal.  Abdominal: Soft. Bowel sounds are normal. She exhibits no mass. There is no tenderness.  Musculoskeletal: Normal range of motion.  Lymphadenopathy:    She has no cervical adenopathy.  Neurological: She is alert and oriented to person, place, and time.  Skin: Skin is warm and dry. No rash noted.  Psychiatric: She has a normal mood and affect. Her behavior is normal.          Assessment & Plan:   Diabetes mellitus type 2. We'll check a hint of A1c Hypertension well controlled. We'll continue present regimen Osteoarthritis stable medications refilled Anxiety disorder stable on present regimen

## 2011-06-03 ENCOUNTER — Other Ambulatory Visit: Payer: Self-pay

## 2011-06-03 MED ORDER — SEDAPAP 50-650 MG PO TABS: 1.0000 mg | ORAL_TABLET | Freq: Every day | ORAL | Status: DC | PRN

## 2011-06-03 MED ORDER — DIAZEPAM 5 MG PO TABS: 5.0000 mg | ORAL_TABLET | Freq: Every day | ORAL | Status: DC | PRN

## 2011-06-03 NOTE — Telephone Encounter (Signed)
Change in meds for qdaily - # 30

## 2011-08-19 ENCOUNTER — Encounter: Payer: Self-pay | Admitting: Internal Medicine

## 2011-08-19 ENCOUNTER — Ambulatory Visit (INDEPENDENT_AMBULATORY_CARE_PROVIDER_SITE_OTHER): Payer: Medicare Other | Admitting: Internal Medicine

## 2011-08-19 VITALS — BP 118/72 | HR 73 | Temp 97.6°F | Wt 144.0 lb

## 2011-08-19 DIAGNOSIS — E119 Type 2 diabetes mellitus without complications: Secondary | ICD-10-CM | POA: Diagnosis not present

## 2011-08-19 DIAGNOSIS — M1990 Unspecified osteoarthritis, unspecified site: Secondary | ICD-10-CM | POA: Diagnosis not present

## 2011-08-19 DIAGNOSIS — I10 Essential (primary) hypertension: Secondary | ICD-10-CM

## 2011-08-19 LAB — POCT CBG (FASTING - GLUCOSE)-MANUAL ENTRY: Glucose Fasting, POC: 121 mg/dL — AB (ref 70–99)

## 2011-08-19 MED ORDER — SEDAPAP 50-650 MG PO TABS: 1.0000 mg | ORAL_TABLET | Freq: Every day | ORAL | Status: DC | PRN

## 2011-08-19 MED ORDER — DIAZEPAM 5 MG PO TABS: 5.0000 mg | ORAL_TABLET | Freq: Every day | ORAL | Status: DC | PRN

## 2011-08-19 NOTE — Patient Instructions (Signed)
Limit your sodium (Salt) intake  Return in 3 months for follow-up   

## 2011-08-19 NOTE — Progress Notes (Signed)
  Subjective:    Patient ID: Christie [name], female    DOB: 09/10/1920, 78 y.o.   MRN: [id number]  HPI  78 year old patient who is seen today for followup. She has a history of type 2 diabetes which has been well-controlled on oral therapy. 3 months ago her hemoglobin A1c 6.8 She has treated hypertension which has been also well controlled. Medical regimen includes hydrochlorothiazide. She has a history of hypokalemia but electrolytes were normal 3 months ago. She also has been treated with verapamil no constipation issues Her main complaint and concern is osteoarthritis especially with the knee pain. This has been stable. She requires a 4. walker to assist with ambulation.    Review of Systems  Constitutional: Negative.   HENT: Negative for hearing loss, congestion, sore throat, rhinorrhea, dental problem, sinus pressure and tinnitus.   Eyes: Negative for pain, discharge and visual disturbance.  Respiratory: Negative for cough and shortness of breath.   Cardiovascular: Negative for chest pain, palpitations and leg swelling.  Gastrointestinal: Negative for nausea, vomiting, abdominal pain, diarrhea, constipation, blood in stool and abdominal distention.  Genitourinary: Negative for dysuria, urgency, frequency, hematuria, flank pain, vaginal bleeding, vaginal discharge, difficulty urinating, vaginal pain and pelvic pain.  Musculoskeletal: Positive for back pain, arthralgias and gait problem. Negative for joint swelling.  Skin: Negative for rash.  Neurological: Negative for dizziness, syncope, speech difficulty, weakness, numbness and headaches.  Hematological: Negative for adenopathy.  Psychiatric/Behavioral: Negative for behavioral problems, dysphoric mood and agitation. The patient is not nervous/anxious.        Objective:   Physical Exam  Constitutional: She is oriented to person, place, and time. She appears well-developed and well-nourished.  HENT:  Head: Normocephalic.  Right Ear:  External ear normal.  Left Ear: External ear normal.  Mouth/Throat: Oropharynx is clear and moist.  Eyes: Conjunctivae and EOM are normal. Pupils are equal, round, and reactive to light.  Neck: Normal range of motion. Neck supple. No thyromegaly present.  Cardiovascular: Normal rate, regular rhythm, normal heart sounds and intact distal pulses.   Pulmonary/Chest: Effort normal and breath sounds normal.  Abdominal: Soft. Bowel sounds are normal. She exhibits no mass. There is no tenderness.  Musculoskeletal: Normal range of motion.       Osteoarthritic changes of the knees with some joint deformity  Lymphadenopathy:    She has no cervical adenopathy.  Neurological: She is alert and oriented to person, place, and time.  Skin: Skin is warm and dry. No rash noted.  Psychiatric: She has a normal mood and affect. Her behavior is normal.          Assessment & Plan:   Diabetes mellitus. Appears to be well controlled with a fasting blood sugar 121 this morning her last hemoglobin A1c 6.8 we'll recheck today Hypertension well controlled. We'll continue present regimen Osteoarthritis with bilateral knee pain  Medications refilled

## 2011-09-03 ENCOUNTER — Telehealth: Payer: Self-pay | Admitting: Internal Medicine

## 2011-09-03 NOTE — Telephone Encounter (Signed)
Pt req refill of SEDAPAP 50-650 MG TABS to [facility]. Pt only has 3 pills left. Pls call in today.

## 2011-09-03 NOTE — Telephone Encounter (Signed)
Called rx into pharmacy.

## 2011-09-03 NOTE — Telephone Encounter (Signed)
Pt is calling back checking status of med refill

## 2011-10-09 DIAGNOSIS — L608 Other nail disorders: Secondary | ICD-10-CM | POA: Diagnosis not present

## 2011-10-09 DIAGNOSIS — E1149 Type 2 diabetes mellitus with other diabetic neurological complication: Secondary | ICD-10-CM | POA: Diagnosis not present

## 2011-11-18 ENCOUNTER — Ambulatory Visit (INDEPENDENT_AMBULATORY_CARE_PROVIDER_SITE_OTHER): Payer: Medicare Other | Admitting: Internal Medicine

## 2011-11-18 ENCOUNTER — Encounter: Payer: Self-pay | Admitting: Internal Medicine

## 2011-11-18 VITALS — BP 120/70 | Temp 97.0°F | Wt 147.0 lb

## 2011-11-18 DIAGNOSIS — M1990 Unspecified osteoarthritis, unspecified site: Secondary | ICD-10-CM | POA: Diagnosis not present

## 2011-11-18 DIAGNOSIS — E119 Type 2 diabetes mellitus without complications: Secondary | ICD-10-CM | POA: Diagnosis not present

## 2011-11-18 DIAGNOSIS — I10 Essential (primary) hypertension: Secondary | ICD-10-CM

## 2011-11-18 NOTE — Progress Notes (Signed)
  Subjective:    Patient ID: Christie [name], female    DOB: August 19, 1920, 78 y.o.   MRN: [id number]  HPI  78 year old patient who is seen today for followup of type 2 diabetes hypertension. She has been arthritis low back pain and ambulates with a walker. Apparently she has not been taken metformin blood sugars seem under reasonable control. Her last hemoglobin A1c increased to 7.2 from 6.8. Blood sugar yesterday evening at home 119 she generally feels well today    Review of Systems  Constitutional: Negative.   HENT: Negative for hearing loss, congestion, sore throat, rhinorrhea, dental problem, sinus pressure and tinnitus.   Eyes: Negative for pain, discharge and visual disturbance.  Respiratory: Negative for cough and shortness of breath.   Cardiovascular: Negative for chest pain, palpitations and leg swelling.  Gastrointestinal: Negative for nausea, vomiting, abdominal pain, diarrhea, constipation, blood in stool and abdominal distention.  Genitourinary: Negative for dysuria, urgency, frequency, hematuria, flank pain, vaginal bleeding, vaginal discharge, difficulty urinating, vaginal pain and pelvic pain.  Musculoskeletal: Positive for back pain, arthralgias and gait problem. Negative for joint swelling.  Skin: Negative for rash.  Neurological: Negative for dizziness, syncope, speech difficulty, weakness, numbness and headaches.  Hematological: Negative for adenopathy.  Psychiatric/Behavioral: Negative for behavioral problems, dysphoric mood and agitation. The patient is not nervous/anxious.        Objective:   Physical Exam  Constitutional: She is oriented to person, place, and time. She appears well-developed and well-nourished.       Blood pressure 110/70  HENT:  Head: Normocephalic.  Right Ear: External ear normal.  Left Ear: External ear normal.  Mouth/Throat: Oropharynx is clear and moist.  Eyes: Conjunctivae and EOM are normal. Pupils are equal, round, and reactive to light.    Neck: Normal range of motion. Neck supple. No thyromegaly present.  Cardiovascular: Normal rate, regular rhythm, normal heart sounds and intact distal pulses.   Pulmonary/Chest: Effort normal and breath sounds normal.  Abdominal: Soft. Bowel sounds are normal. She exhibits no mass. There is no tenderness.  Musculoskeletal: Normal range of motion. She exhibits edema.       Trace ankle edema  Lymphadenopathy:    She has no cervical adenopathy.  Neurological: She is alert and oriented to person, place, and time.  Skin: Skin is warm and dry. No rash noted.  Psychiatric: She has a normal mood and affect. Her behavior is normal.          Assessment & Plan:   Diabetes mellitus. We'll resume metformin 500 mg once daily after breakfast Hypertension well controlled Osteoarthritis  Compliance issues stressed will return in 3 months and a hemoglobin A1c will be checked at that time

## 2011-11-18 NOTE — Patient Instructions (Signed)
Limit your sodium (Salt) intake   Please check your hemoglobin A1c every 3 months   

## 2011-11-26 ENCOUNTER — Other Ambulatory Visit: Payer: Self-pay | Admitting: Internal Medicine

## 2012-01-08 DIAGNOSIS — E1149 Type 2 diabetes mellitus with other diabetic neurological complication: Secondary | ICD-10-CM | POA: Diagnosis not present

## 2012-01-08 DIAGNOSIS — L608 Other nail disorders: Secondary | ICD-10-CM | POA: Diagnosis not present

## 2012-01-20 ENCOUNTER — Telehealth: Payer: Self-pay | Admitting: *Deleted

## 2012-01-20 NOTE — Telephone Encounter (Signed)
Refill Sedapap 50/650 qd prn

## 2012-01-20 NOTE — Telephone Encounter (Signed)
All medications are refilled at the time of her office visits

## 2012-01-20 NOTE — Telephone Encounter (Signed)
Pharmacy aware

## 2012-01-21 MED ORDER — BUTALBITAL-ACETAMINOPHEN 50-650 MG PO TABS: 1.0000 | ORAL_TABLET | Freq: Every day | ORAL | Status: DC

## 2012-01-21 MED ORDER — DIAZEPAM 5 MG PO TABS: 5.0000 mg | ORAL_TABLET | Freq: Every day | ORAL | Status: DC | PRN

## 2012-01-21 NOTE — Telephone Encounter (Signed)
Daughter came in and wanted to know why pt could not have refills, talked to Dr Kirtland Bouchard and per Dr Kirtland Bouchard if daughter says she needs her meds than its ok b/c pt can sometimes overuse.

## 2012-01-21 NOTE — Addendum Note (Signed)
Addended by: Alfred Levins D on: 01/21/2012 03:17 PM   Modules accepted: Orders

## 2012-02-01 ENCOUNTER — Other Ambulatory Visit: Payer: Self-pay | Admitting: Internal Medicine

## 2012-02-07 ENCOUNTER — Other Ambulatory Visit: Payer: Self-pay | Admitting: Internal Medicine

## 2012-02-12 ENCOUNTER — Other Ambulatory Visit: Payer: Self-pay | Admitting: Internal Medicine

## 2012-02-17 ENCOUNTER — Ambulatory Visit (INDEPENDENT_AMBULATORY_CARE_PROVIDER_SITE_OTHER): Payer: Medicare Other | Admitting: Internal Medicine

## 2012-02-17 ENCOUNTER — Encounter: Payer: Self-pay | Admitting: Internal Medicine

## 2012-02-17 VITALS — BP 112/70 | Wt 142.0 lb

## 2012-02-17 DIAGNOSIS — E119 Type 2 diabetes mellitus without complications: Secondary | ICD-10-CM

## 2012-02-17 DIAGNOSIS — M1990 Unspecified osteoarthritis, unspecified site: Secondary | ICD-10-CM | POA: Diagnosis not present

## 2012-02-17 DIAGNOSIS — I10 Essential (primary) hypertension: Secondary | ICD-10-CM | POA: Diagnosis not present

## 2012-02-17 DIAGNOSIS — E785 Hyperlipidemia, unspecified: Secondary | ICD-10-CM | POA: Diagnosis not present

## 2012-02-17 MED ORDER — BUTALBITAL-ACETAMINOPHEN 50-650 MG PO TABS: 1.0000 | ORAL_TABLET | Freq: Every day | ORAL | Status: DC

## 2012-02-17 MED ORDER — DIAZEPAM 5 MG PO TABS: 5.0000 mg | ORAL_TABLET | Freq: Every day | ORAL | Status: DC | PRN

## 2012-02-17 NOTE — Progress Notes (Signed)
Subjective:    Patient ID: Christie [name], female    DOB: [birth year], 78 y.o.   MRN: [id number]  HPI 78 year old patient who is seen today for followup of her type 2 diabetes. 3 months ago metformin 500 mg daily was added to her regimen. In January hemoglobin A1c had risen to 7.2. She tolerates this medication well. She enjoys nice glycemic control with blood sugars often less than 100. She feels quite well today She has advanced arthritis and uses a walker. She continues to have back and leg pain. She has treated hypertension which hasn't well controlled on diuretic therapy. Blood pressure well controlled today.  Past Medical History  Diagnosis Date  . ANXIETY 01/31/2007  . DIABETES MELLITUS, TYPE II 01/31/2007  . Headache 01/31/2007  . HYPERLIPIDEMIA 01/31/2007  . HYPERTENSION 01/31/2007  . HYPOKALEMIA, HX OF 02/09/2008  . OSTEOARTHRITIS 01/31/2007    History   Social History  . Marital Status: Widowed    Spouse Name: N/A    Number of Children: N/A  . Years of Education: N/A   Occupational History  . Not on file.   Social History Main Topics  . Smoking status: Former Games developer  . Smokeless tobacco: Never Used  . Alcohol Use: No  . Drug Use: No  . Sexually Active: Not on file   Other Topics Concern  . Not on file   Social History Narrative  . No narrative on file    Past Surgical History  Procedure Date  . Abdominal hysterectomy   . Cataract extraction     Family History  Problem Relation Age of Onset  . Cancer Neg Hx     5 sisters - all pos for breast ca  . Heart disease Neg Hx     family    Allergies  Allergen Reactions  . Butalbital-Aspirin-Caffeine   . Codeine     Current Outpatient Prescriptions on File Prior to Visit  Medication Sig Dispense Refill  . ACETAMINOPHEN-BUTALBITAL (SEDAPAP) 50-650 MG TABS Take 1 tablet by mouth daily.  30 each  1  . CALAN SR 180 MG CR tablet TAKE 1 TABLET ONCE DAILY.  90 each  3  . diazepam (VALIUM) 5 MG tablet Take 1 tablet (5  mg total) by mouth daily as needed.  30 tablet  3  . GLIPIZIDE XL 5 MG 24 hr tablet TAKE 1 TABLET ONCE DAILY.  90 each  1  . hydrochlorothiazide (HYDRODIURIL) 25 MG tablet TAKE 1 TABLET EACH DAY.  90 tablet  2  . metFORMIN (GLUCOPHAGE-XR) 500 MG 24 hr tablet TAKE ONE TABLET AT BEDTIME.  90 tablet  3  . potassium chloride SA (K-DUR,KLOR-CON) 20 MEQ tablet Take 1 tablet (20 mEq total) by mouth 2 (two) times daily.  90 tablet  6    BP 112/70  Wt 142 lb (64.411 kg)          Review of Systems  Constitutional: Negative.   HENT: Negative for hearing loss, congestion, sore throat, rhinorrhea, dental problem, sinus pressure and tinnitus.   Eyes: Negative for pain, discharge and visual disturbance.  Respiratory: Negative for cough and shortness of breath.   Cardiovascular: Negative for chest pain, palpitations and leg swelling.  Gastrointestinal: Negative for nausea, vomiting, abdominal pain, diarrhea, constipation, blood in stool and abdominal distention.  Genitourinary: Negative for dysuria, urgency, frequency, hematuria, flank pain, vaginal bleeding, vaginal discharge, difficulty urinating, vaginal pain and pelvic pain.  Musculoskeletal: Positive for back pain and gait problem. Negative for joint swelling and  arthralgias.  Skin: Negative for rash.  Neurological: Negative for dizziness, syncope, speech difficulty, weakness, numbness and headaches.  Hematological: Negative for adenopathy.  Psychiatric/Behavioral: Negative for behavioral problems, dysphoric mood and agitation. The patient is not nervous/anxious.        Objective:   Physical Exam  Constitutional: She is oriented to person, place, and time. She appears well-developed and well-nourished.       Blood pressure low normal  Walks with a walker  HENT:  Head: Normocephalic.  Right Ear: External ear normal.  Left Ear: External ear normal.  Mouth/Throat: Oropharynx is clear and moist.  Eyes: Conjunctivae and EOM are normal.  Pupils are equal, round, and reactive to light.  Neck: Normal range of motion. Neck supple. No thyromegaly present.  Cardiovascular: Normal rate, regular rhythm, normal heart sounds and intact distal pulses.   Pulmonary/Chest: Effort normal and breath sounds normal.  Abdominal: Soft. Bowel sounds are normal. She exhibits no mass. There is no tenderness.  Musculoskeletal: Normal range of motion.  Lymphadenopathy:    She has no cervical adenopathy.  Neurological: She is alert and oriented to person, place, and time.  Skin: Skin is warm and dry. No rash noted.  Psychiatric: She has a normal mood and affect. Her behavior is normal.          Assessment & Plan:  Hypertension stable Diabetes mellitus. Will check a hemoglobin A1c. Continue present regimen Advanced arthritis. Analgesics refill Chronic anxiety Valium refilled  Recheck 3 months

## 2012-03-03 ENCOUNTER — Other Ambulatory Visit: Payer: Self-pay

## 2012-03-03 MED ORDER — LANCETS 28G MISC: 1.0000 | Freq: Every day | Status: DC

## 2012-03-03 MED ORDER — GLUCOSE BLOOD VI STRP: 1.0000 | ORAL_STRIP | Freq: Every day | Status: DC

## 2012-03-29 ENCOUNTER — Other Ambulatory Visit: Payer: Self-pay | Admitting: Internal Medicine

## 2012-04-08 DIAGNOSIS — E1149 Type 2 diabetes mellitus with other diabetic neurological complication: Secondary | ICD-10-CM | POA: Diagnosis not present

## 2012-04-08 DIAGNOSIS — L608 Other nail disorders: Secondary | ICD-10-CM | POA: Diagnosis not present

## 2012-04-18 ENCOUNTER — Other Ambulatory Visit: Payer: Self-pay

## 2012-04-18 MED ORDER — BLOOD GLUCOSE TEST VI STRP: 1.0000 | ORAL_STRIP | Freq: Every day | Status: DC

## 2012-04-18 MED ORDER — LANCETS ULTRA FINE MISC: 1.0000 | Freq: Every day | Status: DC

## 2012-05-18 ENCOUNTER — Ambulatory Visit (INDEPENDENT_AMBULATORY_CARE_PROVIDER_SITE_OTHER): Payer: Medicare Other | Admitting: Internal Medicine

## 2012-05-18 ENCOUNTER — Encounter: Payer: Self-pay | Admitting: Internal Medicine

## 2012-05-18 VITALS — BP 116/68 | Wt 145.0 lb

## 2012-05-18 DIAGNOSIS — I10 Essential (primary) hypertension: Secondary | ICD-10-CM | POA: Diagnosis not present

## 2012-05-18 DIAGNOSIS — E119 Type 2 diabetes mellitus without complications: Secondary | ICD-10-CM

## 2012-05-18 DIAGNOSIS — M1990 Unspecified osteoarthritis, unspecified site: Secondary | ICD-10-CM

## 2012-05-18 MED ORDER — DIAZEPAM 5 MG PO TABS: 5.0000 mg | ORAL_TABLET | Freq: Every day | ORAL | Status: DC | PRN

## 2012-05-18 MED ORDER — HYDROCHLOROTHIAZIDE 25 MG PO TABS: 25.0000 mg | ORAL_TABLET | Freq: Every day | ORAL | Status: DC

## 2012-05-18 MED ORDER — POTASSIUM CHLORIDE CRYS ER 20 MEQ PO TBCR: 20.0000 meq | EXTENDED_RELEASE_TABLET | Freq: Every day | ORAL | Status: DC

## 2012-05-18 MED ORDER — BUTALBITAL-ACETAMINOPHEN 50-650 MG PO TABS: 1.0000 | ORAL_TABLET | Freq: Every day | ORAL | Status: DC

## 2012-05-18 MED ORDER — VERAPAMIL HCL ER 180 MG PO TBCR: 180.0000 mg | EXTENDED_RELEASE_TABLET | Freq: Every day | ORAL | Status: DC

## 2012-05-18 MED ORDER — METFORMIN HCL ER 500 MG PO TB24: 1000.0000 mg | ORAL_TABLET | Freq: Every day | ORAL | Status: DC

## 2012-05-18 MED ORDER — GLIPIZIDE ER 5 MG PO TB24: 5.0000 mg | ORAL_TABLET | Freq: Every day | ORAL | Status: DC

## 2012-05-18 NOTE — Patient Instructions (Signed)
Please check your hemoglobin A1c every 3 months  Limit your sodium (Salt) intake  Please see your eye doctor yearly to check for diabetic eye damage  

## 2012-05-18 NOTE — Progress Notes (Signed)
Subjective:    Patient ID: Christie [name], female    DOB: May 01, 1921, 78 y.o.   MRN: [id number]  HPI  78 year old patient who is seen today for followup of her type 2 diabetes. She has treated hypertension and also a history of significant osteoarthritis. She is doing reasonably well today no new concerns or complaints. Her last hemoglobin A1c 7.2  Past Medical History  Diagnosis Date  . ANXIETY 01/31/2007  . DIABETES MELLITUS, TYPE II 01/31/2007  . Headache 01/31/2007  . HYPERLIPIDEMIA 01/31/2007  . HYPERTENSION 01/31/2007  . HYPOKALEMIA, HX OF 02/09/2008  . OSTEOARTHRITIS 01/31/2007    History   Social History  . Marital Status: Widowed    Spouse Name: N/A    Number of Children: N/A  . Years of Education: N/A   Occupational History  . Not on file.   Social History Main Topics  . Smoking status: Former Games developer  . Smokeless tobacco: Never Used  . Alcohol Use: No  . Drug Use: No  . Sexually Active: Not on file   Other Topics Concern  . Not on file   Social History Narrative  . No narrative on file    Past Surgical History  Procedure Date  . Abdominal hysterectomy   . Cataract extraction     Family History  Problem Relation Age of Onset  . Cancer Neg Hx     5 sisters - all pos for breast ca  . Heart disease Neg Hx     family    Allergies  Allergen Reactions  . Butalbital-Aspirin-Caffeine   . Codeine     Current Outpatient Prescriptions on File Prior to Visit  Medication Sig Dispense Refill  . ACETAMINOPHEN-BUTALBITAL (SEDAPAP) 50-650 MG TABS Take 1 tablet by mouth daily.  30 each  3  . CALAN SR 180 MG CR tablet TAKE 1 TABLET ONCE DAILY.  90 each  3  . diazepam (VALIUM) 5 MG tablet Take 1 tablet (5 mg total) by mouth daily as needed.  30 tablet  3  . GLIPIZIDE XL 5 MG 24 hr tablet TAKE 1 TABLET ONCE DAILY.  90 each  1  . Glucose Blood (BLOOD GLUCOSE TEST STRIPS) STRP 1 each by Other route daily.  50 each  6  . hydrochlorothiazide (HYDRODIURIL) 25 MG tablet TAKE 1  TABLET EACH DAY.  90 tablet  2  . K-DUR 20 MEQ tablet TAKE 1 TABLET TWICE DAILY.  180 each  3  . LANCETS ULTRA FINE MISC 1 each by Does not apply route daily.  100 each  3  . metFORMIN (GLUCOPHAGE-XR) 500 MG 24 hr tablet TAKE ONE TABLET AT BEDTIME.  90 tablet  3    BP 116/68  Wt 145 lb (65.772 kg)       Review of Systems  Constitutional: Negative.   HENT: Negative for hearing loss, congestion, sore throat, rhinorrhea, dental problem, sinus pressure and tinnitus.   Eyes: Negative for pain, discharge and visual disturbance.  Respiratory: Negative for cough and shortness of breath.   Cardiovascular: Negative for chest pain, palpitations and leg swelling.  Gastrointestinal: Negative for nausea, vomiting, abdominal pain, diarrhea, constipation, blood in stool and abdominal distention.  Genitourinary: Negative for dysuria, urgency, frequency, hematuria, flank pain, vaginal bleeding, vaginal discharge, difficulty urinating, vaginal pain and pelvic pain.  Musculoskeletal: Positive for back pain, arthralgias and gait problem. Negative for joint swelling.  Skin: Negative for rash.  Neurological: Negative for dizziness, syncope, speech difficulty, weakness, numbness and headaches.  Hematological: Negative  for adenopathy.  Psychiatric/Behavioral: Negative for behavioral problems, dysphoric mood and agitation. The patient is not nervous/anxious.        Objective:   Physical Exam  Constitutional: She is oriented to person, place, and time. She appears well-developed and well-nourished.  HENT:  Head: Normocephalic.  Right Ear: External ear normal.  Left Ear: External ear normal.  Mouth/Throat: Oropharynx is clear and moist.  Eyes: Conjunctivae normal and EOM are normal. Pupils are equal, round, and reactive to light.  Neck: Normal range of motion. Neck supple. No thyromegaly present.  Cardiovascular: Normal rate, regular rhythm, normal heart sounds and intact distal pulses.     Pulmonary/Chest: Effort normal and breath sounds normal.  Abdominal: Soft. Bowel sounds are normal. She exhibits no mass. There is no tenderness.  Lymphadenopathy:    She has no cervical adenopathy.  Neurological: She is alert and oriented to person, place, and time.  Skin: Skin is warm and dry. No rash noted.  Psychiatric: She has a normal mood and affect. Her behavior is normal.          Assessment & Plan:   Diabetes mellitus. Fasting blood sugars are generally in the 150 range. Her last hemoglobin A1c 7.2. She tolerates a low dose metformin well. We'll increase to 1000 mg daily Hypertension well controlled  Osteoarthritis stable  Check hemoglobin A1c

## 2012-05-26 ENCOUNTER — Other Ambulatory Visit: Payer: Self-pay

## 2012-05-26 MED ORDER — METFORMIN HCL ER 500 MG PO TB24: 1000.0000 mg | ORAL_TABLET | Freq: Every day | ORAL | Status: DC

## 2012-06-17 ENCOUNTER — Other Ambulatory Visit: Payer: Self-pay | Admitting: *Deleted

## 2012-06-17 MED ORDER — DIAZEPAM 5 MG PO TABS: 5.0000 mg | ORAL_TABLET | Freq: Every day | ORAL | Status: DC | PRN

## 2012-06-17 NOTE — Telephone Encounter (Signed)
Received fax from pharmacy requesting 10 tablets of Diazepam tabs were crumbled. Called pharmacy and told pharmacist okay to dispense 10 tablets till next refill.

## 2012-06-21 ENCOUNTER — Other Ambulatory Visit: Payer: Self-pay | Admitting: *Deleted

## 2012-06-21 MED ORDER — BUTALBITAL-ACETAMINOPHEN 50-300 MG PO TABS: 1.0000 | ORAL_TABLET | Freq: Every day | ORAL | Status: DC

## 2012-08-16 ENCOUNTER — Encounter: Payer: Self-pay | Admitting: Internal Medicine

## 2012-08-16 ENCOUNTER — Ambulatory Visit (INDEPENDENT_AMBULATORY_CARE_PROVIDER_SITE_OTHER): Payer: Medicare Other | Admitting: Internal Medicine

## 2012-08-16 VITALS — BP 130/68 | HR 72 | Temp 98.7°F | Resp 20 | Wt 144.0 lb

## 2012-08-16 DIAGNOSIS — M1990 Unspecified osteoarthritis, unspecified site: Secondary | ICD-10-CM | POA: Diagnosis not present

## 2012-08-16 DIAGNOSIS — E119 Type 2 diabetes mellitus without complications: Secondary | ICD-10-CM

## 2012-08-16 DIAGNOSIS — I10 Essential (primary) hypertension: Secondary | ICD-10-CM

## 2012-08-16 LAB — CBC WITH DIFFERENTIAL/PLATELET
Basophils Relative: 0.2 % (ref 0.0–3.0)
Eosinophils Relative: 0.9 % (ref 0.0–5.0)
Lymphocytes Relative: 20.5 % (ref 12.0–46.0)
MCV: 80.5 fl (ref 78.0–100.0)
Monocytes Absolute: 0.8 10*3/uL (ref 0.1–1.0)
Monocytes Relative: 12 % (ref 3.0–12.0)
Neutrophils Relative %: 66.4 % (ref 43.0–77.0)
RBC: 5.09 Mil/uL (ref 3.87–5.11)
WBC: 6.5 10*3/uL (ref 4.5–10.5)

## 2012-08-16 LAB — COMPREHENSIVE METABOLIC PANEL
Albumin: 3.5 g/dL (ref 3.5–5.2)
BUN: 32 mg/dL — ABNORMAL HIGH (ref 6–23)
CO2: 24 mEq/L (ref 19–32)
Calcium: 9.1 mg/dL (ref 8.4–10.5)
Chloride: 103 mEq/L (ref 96–112)
GFR: 72.05 mL/min (ref >60.00)
Glucose, Bld: 109 mg/dL — ABNORMAL HIGH (ref 70–99)
Potassium: 3.8 mEq/L (ref 3.5–5.1)

## 2012-08-16 MED ORDER — DIAZEPAM 5 MG PO TABS: 5.0000 mg | ORAL_TABLET | Freq: Every day | ORAL | Status: DC | PRN

## 2012-08-16 MED ORDER — BUTALBITAL-ACETAMINOPHEN 50-300 MG PO TABS: 1.0000 | ORAL_TABLET | Freq: Every day | ORAL | Status: DC

## 2012-08-16 NOTE — Progress Notes (Signed)
Subjective:    Patient ID: Christie [name], female    DOB: July 08, 1921, 79 y.o.   MRN: [id number]  HPI 79 year old patient who is seen today for followup. She has a history of hypertension and type 2 diabetes. She does remarkably well. She has a history of significant osteoarthritis requires a walker for ambulation. Her last hemoglobin A1c 6.9. No hypoglycemia.  Past Medical History  Diagnosis Date  . ANXIETY 01/31/2007  . DIABETES MELLITUS, TYPE II 01/31/2007  . Headache 01/31/2007  . HYPERLIPIDEMIA 01/31/2007  . HYPERTENSION 01/31/2007  . HYPOKALEMIA, HX OF 02/09/2008  . OSTEOARTHRITIS 01/31/2007    History   Social History  . Marital Status: Widowed    Spouse Name: N/A    Number of Children: N/A  . Years of Education: N/A   Occupational History  . Not on file.   Social History Main Topics  . Smoking status: Former Games developer  . Smokeless tobacco: Never Used  . Alcohol Use: No  . Drug Use: No  . Sexually Active: Not on file   Other Topics Concern  . Not on file   Social History Narrative  . No narrative on file    Past Surgical History  Procedure Date  . Abdominal hysterectomy   . Cataract extraction     Family History  Problem Relation Age of Onset  . Cancer Neg Hx     5 sisters - all pos for breast ca  . Heart disease Neg Hx     family    Allergies  Allergen Reactions  . Butalbital-Aspirin-Caffeine   . Codeine     Current Outpatient Prescriptions on File Prior to Visit  Medication Sig Dispense Refill  . Butalbital-Acetaminophen (BUPAP) 50-300 MG TABS Take 1 each by mouth daily.  90 tablet  0  . diazepam (VALIUM) 5 MG tablet Take 1 tablet (5 mg total) by mouth daily as needed.  10 tablet  0  . glipiZIDE (GLIPIZIDE XL) 5 MG 24 hr tablet Take 1 tablet (5 mg total) by mouth daily.  90 tablet  6  . Glucose Blood (BLOOD GLUCOSE TEST STRIPS) STRP 1 each by Other route daily.  50 each  6  . hydrochlorothiazide (HYDRODIURIL) 25 MG tablet Take 1 tablet (25 mg total) by  mouth daily.  90 tablet  2  . LANCETS ULTRA FINE MISC 1 each by Does not apply route daily.  100 each  3  . metFORMIN (GLUCOPHAGE-XR) 500 MG 24 hr tablet Take 1,000 mg by mouth daily with supper.      . potassium chloride SA (K-DUR) 20 MEQ tablet Take 1 tablet (20 mEq total) by mouth daily.  90 tablet  4  . verapamil (CALAN SR) 180 MG CR tablet Take 1 tablet (180 mg total) by mouth at bedtime.  90 tablet  6    BP 130/68  Pulse 72  Temp 98.7 F (37.1 C) (Oral)  Resp 20  Wt 144 lb (65.318 kg)  SpO2 97%      Review of Systems  Constitutional: Negative.   HENT: Negative for hearing loss, congestion, sore throat, rhinorrhea, dental problem, sinus pressure and tinnitus.   Eyes: Negative for pain, discharge and visual disturbance.  Respiratory: Negative for cough and shortness of breath.   Cardiovascular: Negative for chest pain, palpitations and leg swelling.  Gastrointestinal: Negative for nausea, vomiting, abdominal pain, diarrhea, constipation, blood in stool and abdominal distention.  Genitourinary: Negative for dysuria, urgency, frequency, hematuria, flank pain, vaginal bleeding, vaginal discharge, difficulty urinating,  vaginal pain and pelvic pain.  Musculoskeletal: Positive for back pain, arthralgias and gait problem. Negative for joint swelling.  Skin: Negative for rash.  Neurological: Negative for dizziness, syncope, speech difficulty, weakness, numbness and headaches.  Hematological: Negative for adenopathy.  Psychiatric/Behavioral: Negative for behavioral problems, dysphoric mood and agitation. The patient is not nervous/anxious.        Objective:   Physical Exam  Constitutional: She is oriented to person, place, and time. She appears well-developed and well-nourished.       Walks with a walker Blood pressure 120/60  HENT:  Head: Normocephalic.  Right Ear: External ear normal.  Left Ear: External ear normal.  Mouth/Throat: Oropharynx is clear and moist.  Eyes:  Conjunctivae normal and EOM are normal. Pupils are equal, round, and reactive to light.  Neck: Normal range of motion. Neck supple. No thyromegaly present.  Cardiovascular: Normal rate, regular rhythm, normal heart sounds and intact distal pulses.   Pulmonary/Chest: Effort normal and breath sounds normal.  Abdominal: Soft. Bowel sounds are normal. She exhibits no mass. There is no tenderness.  Musculoskeletal: Normal range of motion.  Lymphadenopathy:    She has no cervical adenopathy.  Neurological: She is alert and oriented to person, place, and time.  Skin: Skin is warm and dry. No rash noted.  Psychiatric: She has a normal mood and affect. Her behavior is normal.          Assessment & Plan:  Diabetes mellitus. Will check a hemoglobin A1c Hypertension well controlled Osteoarthritis  Meds updated Recheck 3 months

## 2012-08-16 NOTE — Patient Instructions (Addendum)
Limit your sodium (Salt) intake  Return in 3 months for follow-up   

## 2012-08-23 DIAGNOSIS — L608 Other nail disorders: Secondary | ICD-10-CM | POA: Diagnosis not present

## 2012-08-23 DIAGNOSIS — E1149 Type 2 diabetes mellitus with other diabetic neurological complication: Secondary | ICD-10-CM | POA: Diagnosis not present

## 2012-10-12 ENCOUNTER — Telehealth: Payer: Self-pay | Admitting: Internal Medicine

## 2012-10-12 NOTE — Telephone Encounter (Signed)
Pt needs refill on valium due to all medication got crumbled except one pill. Pt is requesting valium #12 call into [facility]. Pt received med on 09-27-12

## 2012-10-13 ENCOUNTER — Encounter: Payer: Self-pay | Admitting: Internal Medicine

## 2012-10-13 ENCOUNTER — Ambulatory Visit (INDEPENDENT_AMBULATORY_CARE_PROVIDER_SITE_OTHER): Payer: Medicare Other | Admitting: Internal Medicine

## 2012-10-13 DIAGNOSIS — R51 Headache: Secondary | ICD-10-CM

## 2012-10-13 DIAGNOSIS — M1990 Unspecified osteoarthritis, unspecified site: Secondary | ICD-10-CM

## 2012-10-13 DIAGNOSIS — E119 Type 2 diabetes mellitus without complications: Secondary | ICD-10-CM

## 2012-10-13 MED ORDER — DIAZEPAM 5 MG PO TABS: 5.0000 mg | ORAL_TABLET | Freq: Every day | ORAL | Status: DC | PRN

## 2012-10-13 MED ORDER — BUTALBITAL-ACETAMINOPHEN 50-300 MG PO TABS: 1.0000 | ORAL_TABLET | Freq: Every day | ORAL | Status: DC

## 2012-10-13 MED ORDER — DIAZEPAM 2 MG PO TABS: 2.0000 mg | ORAL_TABLET | Freq: Four times a day (QID) | ORAL | Status: DC | PRN

## 2012-10-13 NOTE — Telephone Encounter (Signed)
Left message on voicemail. Rx was taken care of pharmacy will give her 12 pills to get through the month.

## 2012-10-13 NOTE — Telephone Encounter (Signed)
Spoke to pt said her pills were crushed and she only had one left for last night. Needs 12 more tablets to take for end of month. Told pt will call pharmacy and see what I can do and get back to her. Called [facility] pharmacy and spoke to [place] told her pt's pills got crushed and she took her last pill last night can we dispense 12 tablets to get through the month. Misty Stanley said yes and that the medication is inexpensive for pt if insurance will not cover. Told her okay and also authorized more refills on current Rx, verbal order for 2 more refills. Misty Stanley verbalized understanding.

## 2012-10-13 NOTE — Progress Notes (Signed)
  Subjective:    Patient ID: Christie [name], female    DOB: 18-Jan-1921, 79 y.o.   MRN: [id number]  HPI  patient's daughter is here to discuss concerns about  [name].  The patient is 79 years old and has a long history of drug seeking behavior. She has been on chronic sedapap  and Valium which has been limited to 1 each daily.  The daughter relates a history of overuse and often will take almost her entire monthly dosing over several days.  The patient also has a history of gait instability but there have been no falls.  The daughter has attempted to control medications and dispense properly  but  the patient at times has called the police  to demand  her medications.    The patient is scheduled for followup in approximately 2 weeks    Review of Systems     Objective:   Physical Exam        Assessment & Plan:

## 2012-10-13 NOTE — Telephone Encounter (Signed)
Spoke to pt told her Rx for Valium was done. Pt verbalized understanding and stated pharmacy called her. Told her okay.

## 2012-11-10 ENCOUNTER — Telehealth: Payer: Self-pay | Admitting: Internal Medicine

## 2012-11-10 NOTE — Telephone Encounter (Signed)
PT called and stated that she NEEDED 12 Valium. Please assist.

## 2012-11-10 NOTE — Telephone Encounter (Signed)
Called and spoke to pt's daughter Christie [name] and told her can not refill Valium it is too soon and according to the pharmacist she just got Valium 5 mg Rx filled on 10/28/2011, they did not realized the Rx changed to Valium 2 mg so they have cancelled the 5 mg Rx . I told the pharmacist not to fill the Valium unless I or you call for refill.  Christie [name] said I need to tell her mother that, told her okay. Spoke to pt and told her can not refill Valium too soon. Pt stated she is totally out. I said to pt that she is only allowed to take one a day. Pt stated I know pills were crushed. Told pt I will call pharmacy and give Valium 2 mg 6 tablets only until her appt with Dr. Kirtland Bouchard on Wed 4/23 and she can discuss with him then. Pt verbalized understanding.  Called [facility] pharmacy spoke to [place] gave her verbal order for Valium 2 mg one tablet by mouth daily, disp # 6, refill 0.

## 2012-11-16 ENCOUNTER — Encounter: Payer: Self-pay | Admitting: Internal Medicine

## 2012-11-16 ENCOUNTER — Ambulatory Visit (INDEPENDENT_AMBULATORY_CARE_PROVIDER_SITE_OTHER): Payer: Medicare Other | Admitting: Internal Medicine

## 2012-11-16 VITALS — BP 120/72 | HR 81 | Temp 98.6°F | Resp 20 | Wt 146.0 lb

## 2012-11-16 DIAGNOSIS — I10 Essential (primary) hypertension: Secondary | ICD-10-CM

## 2012-11-16 DIAGNOSIS — E119 Type 2 diabetes mellitus without complications: Secondary | ICD-10-CM

## 2012-11-16 DIAGNOSIS — M1990 Unspecified osteoarthritis, unspecified site: Secondary | ICD-10-CM | POA: Diagnosis not present

## 2012-11-16 LAB — HEMOGLOBIN A1C: Hgb A1c MFr Bld: 6.7 % — ABNORMAL HIGH (ref 4.6–6.5)

## 2012-11-16 MED ORDER — DIAZEPAM 2 MG PO TABS: 2.0000 mg | ORAL_TABLET | Freq: Four times a day (QID) | ORAL | Status: DC | PRN

## 2012-11-16 MED ORDER — VERAPAMIL HCL ER 180 MG PO TBCR: 180.0000 mg | EXTENDED_RELEASE_TABLET | Freq: Every day | ORAL | Status: DC

## 2012-11-16 MED ORDER — POTASSIUM CHLORIDE CRYS ER 20 MEQ PO TBCR: 20.0000 meq | EXTENDED_RELEASE_TABLET | Freq: Every day | ORAL | Status: DC

## 2012-11-16 MED ORDER — METFORMIN HCL ER 500 MG PO TB24: 1000.0000 mg | ORAL_TABLET | Freq: Every day | ORAL | Status: DC

## 2012-11-16 MED ORDER — HYDROCHLOROTHIAZIDE 25 MG PO TABS: 25.0000 mg | ORAL_TABLET | Freq: Every day | ORAL | Status: DC

## 2012-11-16 MED ORDER — GLIPIZIDE ER 5 MG PO TB24: 5.0000 mg | ORAL_TABLET | Freq: Every day | ORAL | Status: DC

## 2012-11-16 NOTE — Patient Instructions (Signed)
Limit your sodium (Salt) intake  NOTE:  All refills will be provided at the time of routine office visits only   Please check your hemoglobin A1c every 3 months

## 2012-11-16 NOTE — Progress Notes (Signed)
Subjective:    Patient ID: Christie [name], female    DOB: Jul 24, 1921, 79 y.o.   MRN: [id number]  HPI  79 year old patient who is seen today for followup of diabetes hypertension and osteoarthritis. She is doing quite well today. She is no longer taking Bupap due  to overuse. She seems to be doing well off this medication and denies much in the way of arthritic pain. She does have significant knee arthritis. She does use low-dose Valium once daily due to "nerves"  Her diabetes remains reasonably well controlled  Past Medical History  Diagnosis Date  . ANXIETY 01/31/2007  . DIABETES MELLITUS, TYPE II 01/31/2007  . Headache 01/31/2007  . HYPERLIPIDEMIA 01/31/2007  . HYPERTENSION 01/31/2007  . HYPOKALEMIA, HX OF 02/09/2008  . OSTEOARTHRITIS 01/31/2007    History   Social History  . Marital Status: Widowed    Spouse Name: N/A    Number of Children: N/A  . Years of Education: N/A   Occupational History  . Not on file.   Social History Main Topics  . Smoking status: Former Games developer  . Smokeless tobacco: Never Used  . Alcohol Use: No  . Drug Use: No  . Sexually Active: Not on file   Other Topics Concern  . Not on file   Social History Narrative  . No narrative on file    Past Surgical History  Procedure Laterality Date  . Abdominal hysterectomy    . Cataract extraction      Family History  Problem Relation Age of Onset  . Cancer Neg Hx     5 sisters - all pos for breast ca  . Heart disease Neg Hx     family    Allergies  Allergen Reactions  . Butalbital-Aspirin-Caffeine   . Codeine     Current Outpatient Prescriptions on File Prior to Visit  Medication Sig Dispense Refill  . diazepam (VALIUM) 2 MG tablet Take 1 tablet (2 mg total) by mouth every 6 (six) hours as needed for anxiety.  30 tablet  0  . glipiZIDE (GLIPIZIDE XL) 5 MG 24 hr tablet Take 1 tablet (5 mg total) by mouth daily.  90 tablet  6  . Glucose Blood (BLOOD GLUCOSE TEST STRIPS) STRP 1 each by Other route  daily.  50 each  6  . hydrochlorothiazide (HYDRODIURIL) 25 MG tablet Take 1 tablet (25 mg total) by mouth daily.  90 tablet  2  . LANCETS ULTRA FINE MISC 1 each by Does not apply route daily.  100 each  3  . metFORMIN (GLUCOPHAGE-XR) 500 MG 24 hr tablet Take 1,000 mg by mouth daily with supper.      . potassium chloride SA (K-DUR) 20 MEQ tablet Take 1 tablet (20 mEq total) by mouth daily.  90 tablet  4  . verapamil (CALAN SR) 180 MG CR tablet Take 1 tablet (180 mg total) by mouth at bedtime.  90 tablet  6   No current facility-administered medications on file prior to visit.    BP 120/72  Pulse 81  Temp(Src) 98.6 F (37 C) (Oral)  Resp 20  Wt 146 lb (66.225 kg)  SpO2 98%     Review of Systems  Constitutional: Negative.   HENT: Negative for hearing loss, congestion, sore throat, rhinorrhea, dental problem, sinus pressure and tinnitus.   Eyes: Negative for pain, discharge and visual disturbance.  Respiratory: Negative for cough and shortness of breath.   Cardiovascular: Negative for chest pain, palpitations and leg swelling.  Gastrointestinal:  Negative for nausea, vomiting, abdominal pain, diarrhea, constipation, blood in stool and abdominal distention.  Genitourinary: Negative for dysuria, urgency, frequency, hematuria, flank pain, vaginal bleeding, vaginal discharge, difficulty urinating, vaginal pain and pelvic pain.  Musculoskeletal: Positive for arthralgias and gait problem. Negative for joint swelling.  Skin: Negative for rash.  Neurological: Negative for dizziness, syncope, speech difficulty, weakness, numbness and headaches.  Hematological: Negative for adenopathy.  Psychiatric/Behavioral: Negative for behavioral problems, dysphoric mood and agitation. The patient is not nervous/anxious.        Objective:   Physical Exam  Constitutional: She is oriented to person, place, and time. She appears well-developed and well-nourished.  HENT:  Head: Normocephalic.  Right Ear:  External ear normal.  Left Ear: External ear normal.  Mouth/Throat: Oropharynx is clear and moist.  Eyes: Conjunctivae and EOM are normal. Pupils are equal, round, and reactive to light.  Neck: Normal range of motion. Neck supple. No thyromegaly present.  Cardiovascular: Normal rate, regular rhythm, normal heart sounds and intact distal pulses.   Pulmonary/Chest: Effort normal and breath sounds normal.  Abdominal: Soft. Bowel sounds are normal. She exhibits no mass. There is no tenderness.  Musculoskeletal: Normal range of motion. She exhibits edema.  Left ankle edema  Lymphadenopathy:    She has no cervical adenopathy.  Neurological: She is alert and oriented to person, place, and time.  Skin: Skin is warm and dry. No rash noted.  Psychiatric: She has a normal mood and affect. Her behavior is normal.          Assessment & Plan:   Hypertension. Well controlled. We'll continue present regimen Diabetes. We'll check a hemoglobin A1c Osteoarthritis. Bupap has been discontinued and has been overused in the past. We'll continue Tylenol when necessary only Anxiety disorder. Will continue Valium at a dose of 2 mg daily maximum 30 per month. We'll give a prescription for #30 with 2 refills. All refills will be handled at the time of office visits only

## 2012-11-22 DIAGNOSIS — E1149 Type 2 diabetes mellitus with other diabetic neurological complication: Secondary | ICD-10-CM | POA: Diagnosis not present

## 2012-11-22 DIAGNOSIS — Q828 Other specified congenital malformations of skin: Secondary | ICD-10-CM | POA: Diagnosis not present

## 2012-11-22 DIAGNOSIS — L608 Other nail disorders: Secondary | ICD-10-CM | POA: Diagnosis not present

## 2013-02-14 DIAGNOSIS — E1149 Type 2 diabetes mellitus with other diabetic neurological complication: Secondary | ICD-10-CM | POA: Diagnosis not present

## 2013-02-14 DIAGNOSIS — L608 Other nail disorders: Secondary | ICD-10-CM | POA: Diagnosis not present

## 2013-02-15 ENCOUNTER — Encounter: Payer: Self-pay | Admitting: Internal Medicine

## 2013-02-15 ENCOUNTER — Ambulatory Visit (INDEPENDENT_AMBULATORY_CARE_PROVIDER_SITE_OTHER): Payer: Medicare Other | Admitting: Internal Medicine

## 2013-02-15 VITALS — BP 138/80 | HR 86 | Temp 98.5°F | Resp 20 | Wt 142.0 lb

## 2013-02-15 DIAGNOSIS — E119 Type 2 diabetes mellitus without complications: Secondary | ICD-10-CM | POA: Diagnosis not present

## 2013-02-15 DIAGNOSIS — M1990 Unspecified osteoarthritis, unspecified site: Secondary | ICD-10-CM | POA: Diagnosis not present

## 2013-02-15 DIAGNOSIS — I10 Essential (primary) hypertension: Secondary | ICD-10-CM | POA: Diagnosis not present

## 2013-02-15 NOTE — Progress Notes (Signed)
Subjective:    Patient ID: Christie [name], female    DOB: 1921/02/18, 79 y.o.   MRN: [id number]  HPI   79 year old patient who is seen today for her quarterly followup. She has type 2 diabetes which has been well-controlled on oral medication. She has a history of osteoarthritis especially involving the knees. She has treated hypertension. Doing quite well. No new concerns or complaints  Past Medical History  Diagnosis Date  . ANXIETY 01/31/2007  . DIABETES MELLITUS, TYPE II 01/31/2007  . Headache(784.0) 01/31/2007  . HYPERLIPIDEMIA 01/31/2007  . HYPERTENSION 01/31/2007  . HYPOKALEMIA, HX OF 02/09/2008  . OSTEOARTHRITIS 01/31/2007    History   Social History  . Marital Status: Widowed    Spouse Name: N/A    Number of Children: N/A  . Years of Education: N/A   Occupational History  . Not on file.   Social History Main Topics  . Smoking status: Former Games developer  . Smokeless tobacco: Never Used  . Alcohol Use: No  . Drug Use: No  . Sexually Active: Not on file   Other Topics Concern  . Not on file   Social History Narrative  . No narrative on file    Past Surgical History  Procedure Laterality Date  . Abdominal hysterectomy    . Cataract extraction      Family History  Problem Relation Age of Onset  . Cancer Neg Hx     5 sisters - all pos for breast ca  . Heart disease Neg Hx     family    Allergies  Allergen Reactions  . Butalbital-Aspirin-Caffeine   . Codeine     Current Outpatient Prescriptions on File Prior to Visit  Medication Sig Dispense Refill  . diazepam (VALIUM) 2 MG tablet Take 1 tablet (2 mg total) by mouth every 6 (six) hours as needed for anxiety.  30 tablet  2  . glipiZIDE (GLIPIZIDE XL) 5 MG 24 hr tablet Take 1 tablet (5 mg total) by mouth daily.  90 tablet  6  . Glucose Blood (BLOOD GLUCOSE TEST STRIPS) STRP 1 each by Other route daily.  50 each  6  . hydrochlorothiazide (HYDRODIURIL) 25 MG tablet Take 1 tablet (25 mg total) by mouth daily.  90 tablet   2  . LANCETS ULTRA FINE MISC 1 each by Does not apply route daily.  100 each  3  . metFORMIN (GLUCOPHAGE-XR) 500 MG 24 hr tablet Take 2 tablets (1,000 mg total) by mouth daily with supper.  90 tablet  4  . potassium chloride SA (K-DUR) 20 MEQ tablet Take 1 tablet (20 mEq total) by mouth daily.  90 tablet  4  . verapamil (CALAN SR) 180 MG CR tablet Take 1 tablet (180 mg total) by mouth at bedtime.  90 tablet  6   No current facility-administered medications on file prior to visit.    BP 138/80  Pulse 86  Temp(Src) 98.5 F (36.9 C) (Oral)  Resp 20  Wt 142 lb (64.411 kg)  SpO2 98%       Review of Systems  Constitutional: Negative.   HENT: Negative for hearing loss, congestion, sore throat, rhinorrhea, dental problem, sinus pressure and tinnitus.   Eyes: Negative for pain, discharge and visual disturbance.  Respiratory: Negative for cough and shortness of breath.   Cardiovascular: Negative for chest pain, palpitations and leg swelling.  Gastrointestinal: Negative for nausea, vomiting, abdominal pain, diarrhea, constipation, blood in stool and abdominal distention.  Genitourinary: Negative for dysuria,  urgency, frequency, hematuria, flank pain, vaginal bleeding, vaginal discharge, difficulty urinating, vaginal pain and pelvic pain.  Musculoskeletal: Positive for arthralgias and gait problem. Negative for joint swelling.  Skin: Negative for rash.  Neurological: Negative for dizziness, syncope, speech difficulty, weakness, numbness and headaches.  Hematological: Negative for adenopathy.  Psychiatric/Behavioral: Negative for behavioral problems, dysphoric mood and agitation. The patient is not nervous/anxious.        Objective:   Physical Exam  Constitutional: She is oriented to person, place, and time. She appears well-developed and well-nourished.  Blood pressure 124/80  HENT:  Head: Normocephalic.  Right Ear: External ear normal.  Left Ear: External ear normal.  Mouth/Throat:  Oropharynx is clear and moist.  Eyes: Conjunctivae and EOM are normal. Pupils are equal, round, and reactive to light.  Neck: Normal range of motion. Neck supple. No thyromegaly present.  Cardiovascular: Normal rate, regular rhythm and normal heart sounds.   Pedal pulses not easily palpable  Pulmonary/Chest: Effort normal and breath sounds normal.  Abdominal: Soft. Bowel sounds are normal. She exhibits no mass. There is no tenderness.  Musculoskeletal: Normal range of motion.  Lymphadenopathy:    She has no cervical adenopathy.  Neurological: She is alert and oriented to person, place, and time.  Skin: Skin is warm and dry. No rash noted.  Psychiatric: She has a normal mood and affect. Her behavior is normal.          Assessment & Plan:   Diabetes mellitus. Appears to be well controlled. We'll check a hemoglobin A1c Hypertension stable Osteoarthritis  Recheck 3 months  Please see your eye doctor yearly to check for diabetic eye damage

## 2013-03-15 ENCOUNTER — Other Ambulatory Visit: Payer: Self-pay | Admitting: Internal Medicine

## 2013-03-23 ENCOUNTER — Other Ambulatory Visit: Payer: Self-pay | Admitting: Internal Medicine

## 2013-03-23 MED ORDER — DIAZEPAM 2 MG PO TABS: 2.0000 mg | ORAL_TABLET | Freq: Four times a day (QID) | ORAL | Status: DC | PRN

## 2013-03-23 NOTE — Telephone Encounter (Signed)
It was my impression that this medication is refilled at the time of her office visit only. Please call in enough medication to last to her next ROV only

## 2013-03-23 NOTE — Telephone Encounter (Signed)
Please advise if refill okay

## 2013-03-24 ENCOUNTER — Other Ambulatory Visit: Payer: Self-pay | Admitting: *Deleted

## 2013-03-24 MED ORDER — BUTALBITAL-ACETAMINOPHEN 50-300 MG PO TABS: 1.0000 | ORAL_TABLET | Freq: Every day | ORAL | Status: DC

## 2013-03-24 NOTE — Telephone Encounter (Signed)
Rx's already taken care of.

## 2013-05-12 ENCOUNTER — Ambulatory Visit (INDEPENDENT_AMBULATORY_CARE_PROVIDER_SITE_OTHER): Payer: Medicare Other

## 2013-05-12 VITALS — BP 127/63 | HR 84 | Resp 16

## 2013-05-12 DIAGNOSIS — E1140 Type 2 diabetes mellitus with diabetic neuropathy, unspecified: Secondary | ICD-10-CM

## 2013-05-12 DIAGNOSIS — L608 Other nail disorders: Secondary | ICD-10-CM | POA: Diagnosis not present

## 2013-05-12 DIAGNOSIS — E1149 Type 2 diabetes mellitus with other diabetic neurological complication: Secondary | ICD-10-CM

## 2013-05-12 DIAGNOSIS — E1142 Type 2 diabetes mellitus with diabetic polyneuropathy: Secondary | ICD-10-CM

## 2013-05-12 NOTE — Progress Notes (Signed)
  Subjective:    Patient ID: Christie [name], female    DOB: 1920-09-29, 79 y.o.   MRN: [id number]  HPI  trim my toenails and present with daughter. No other new significant changes in medication her health status.   Review of Systems  Constitutional: Negative.   HENT: Negative.   Respiratory: Negative.   Cardiovascular: Negative.   Gastrointestinal: Negative.   Endocrine: Negative.   Genitourinary: Negative.   Musculoskeletal: Negative.   Skin: Negative.   Allergic/Immunologic: Negative.   Neurological: Negative.   Hematological: Negative.   Psychiatric/Behavioral: Negative.        Objective:   Physical Exam vascular status is as follows DP pulses +1/4 PT pulses nonpalpable bilateral. There is +1 edema bilateral ankles. Hair growth is diminished nails thick hypertrophic criptotic incurvated. Tender on palpation and attempted debridement. Orthopedic exam unremarkable rectus foot mild flexible digital contractures noted. Neurologically epicritic sensations diminished on Semmes Weinstein testing to the forefoot and digits. There is normal plantar response. DTRs not listed     Assessment & Plan:  Assessment diabetes with peripheral neuropathy. Nails thick dystrophic friability with onychomycosis and dystrophy of the nails. At this time nails debrided 1 through 5 bilateral return in 3 months for further followup and palliative care is needed  Alvan Dame DPM

## 2013-05-12 NOTE — Patient Instructions (Signed)

## 2013-05-17 ENCOUNTER — Encounter: Payer: Self-pay | Admitting: Internal Medicine

## 2013-05-17 ENCOUNTER — Ambulatory Visit (INDEPENDENT_AMBULATORY_CARE_PROVIDER_SITE_OTHER): Payer: Medicare Other | Admitting: Internal Medicine

## 2013-05-17 VITALS — BP 120/70 | HR 81 | Temp 98.4°F | Resp 20 | Wt 133.0 lb

## 2013-05-17 DIAGNOSIS — M1990 Unspecified osteoarthritis, unspecified site: Secondary | ICD-10-CM

## 2013-05-17 DIAGNOSIS — I10 Essential (primary) hypertension: Secondary | ICD-10-CM | POA: Diagnosis not present

## 2013-05-17 DIAGNOSIS — E119 Type 2 diabetes mellitus without complications: Secondary | ICD-10-CM | POA: Diagnosis not present

## 2013-05-17 LAB — HEMOGLOBIN A1C: Hgb A1c MFr Bld: 7.9 % — ABNORMAL HIGH (ref 4.6–6.5)

## 2013-05-17 MED ORDER — BUTALBITAL-ACETAMINOPHEN 50-300 MG PO TABS: 1.0000 | ORAL_TABLET | Freq: Every day | ORAL | Status: DC

## 2013-05-17 MED ORDER — DIAZEPAM 2 MG PO TABS: 2.0000 mg | ORAL_TABLET | Freq: Four times a day (QID) | ORAL | Status: DC | PRN

## 2013-05-17 NOTE — Patient Instructions (Signed)
Limit your sodium (Salt) intake   Please check your hemoglobin A1c every 3 months   

## 2013-05-17 NOTE — Progress Notes (Signed)
  Subjective:    Patient ID: Christie [name], female    DOB: 01/06/1921, 79 y.o.   MRN: [id number]  HPI  Wt Readings from Last 3 Encounters:  05/17/13 133 lb (60.328 kg)  02/15/13 142 lb (64.411 kg)  11/16/12 146 lb (66.225 kg)    Review of Systems     Objective:   Physical Exam        Assessment & Plan:

## 2013-05-17 NOTE — Progress Notes (Signed)
Subjective:    Patient ID: Christie [name], female    DOB: 1921-05-10, 79 y.o.   MRN: [id number]  HPI  79 year old patient who is seen today for her quarterly followup. He is type 2 diabetes which has been controlled on oral medications. She has significant osteoarthritis and also a history of hypertension. She is doing reasonably well. She is accompanied by her daughter. She has a history also of anxiety which is managed on the low-dose diazepam. No new concerns or complaints. She declines vaccinations today including flu vaccine.  Past Medical History  Diagnosis Date  . ANXIETY 01/31/2007  . DIABETES MELLITUS, TYPE II 01/31/2007  . Headache(784.0) 01/31/2007  . HYPERLIPIDEMIA 01/31/2007  . HYPERTENSION 01/31/2007  . HYPOKALEMIA, HX OF 02/09/2008  . OSTEOARTHRITIS 01/31/2007    History   Social History  . Marital Status: Widowed    Spouse Name: N/A    Number of Children: N/A  . Years of Education: N/A   Occupational History  . Not on file.   Social History Main Topics  . Smoking status: Former Games developer  . Smokeless tobacco: Never Used  . Alcohol Use: No  . Drug Use: No  . Sexual Activity: Not on file   Other Topics Concern  . Not on file   Social History Narrative  . No narrative on file    Past Surgical History  Procedure Laterality Date  . Abdominal hysterectomy    . Cataract extraction      Family History  Problem Relation Age of Onset  . Cancer Neg Hx     5 sisters - all pos for breast ca  . Heart disease Neg Hx     family    Allergies  Allergen Reactions  . Butalbital-Aspirin-Caffeine   . Codeine     Current Outpatient Prescriptions on File Prior to Visit  Medication Sig Dispense Refill  . glipiZIDE (GLIPIZIDE XL) 5 MG 24 hr tablet Take 1 tablet (5 mg total) by mouth daily.  90 tablet  6  . Glucose Blood (BLOOD GLUCOSE TEST STRIPS) STRP 1 each by Other route daily.  50 each  6  . hydrochlorothiazide (HYDRODIURIL) 25 MG tablet Take 1 tablet (25 mg total) by  mouth daily.  90 tablet  2  . LANCETS ULTRA FINE MISC 1 each by Does not apply route daily.  100 each  3  . metFORMIN (GLUCOPHAGE-XR) 500 MG 24 hr tablet Take 2 tablets (1,000 mg total) by mouth daily with supper.  90 tablet  4  . potassium chloride SA (K-DUR) 20 MEQ tablet Take 1 tablet (20 mEq total) by mouth daily.  90 tablet  4  . verapamil (CALAN SR) 180 MG CR tablet Take 1 tablet (180 mg total) by mouth at bedtime.  90 tablet  6   No current facility-administered medications on file prior to visit.    BP 120/70  Pulse 81  Temp(Src) 98.4 F (36.9 C) (Oral)  Resp 20  Wt 133 lb (60.328 kg)  SpO2 98%     Review of Systems  Constitutional: Negative.   HENT: Negative for congestion, dental problem, hearing loss, rhinorrhea, sinus pressure, sore throat and tinnitus.   Eyes: Negative for pain, discharge and visual disturbance.  Respiratory: Negative for cough and shortness of breath.   Cardiovascular: Negative for chest pain, palpitations and leg swelling.  Gastrointestinal: Negative for nausea, vomiting, abdominal pain, diarrhea, constipation, blood in stool and abdominal distention.  Genitourinary: Negative for dysuria, urgency, frequency, hematuria, flank pain, vaginal  bleeding, vaginal discharge, difficulty urinating, vaginal pain and pelvic pain.  Musculoskeletal: Positive for arthralgias, back pain, gait problem and myalgias. Negative for joint swelling.  Skin: Negative for rash.  Neurological: Negative for dizziness, syncope, speech difficulty, weakness, numbness and headaches.  Hematological: Negative for adenopathy.  Psychiatric/Behavioral: Negative for behavioral problems, dysphoric mood and agitation. The patient is not nervous/anxious.        Objective:   Physical Exam  Constitutional: She is oriented to person, place, and time. She appears well-developed and well-nourished.  HENT:  Head: Normocephalic.  Right Ear: External ear normal.  Left Ear: External ear  normal.  Mouth/Throat: Oropharynx is clear and moist.  Eyes: Conjunctivae and EOM are normal. Pupils are equal, round, and reactive to light.  Neck: Normal range of motion. Neck supple. No thyromegaly present.  Cardiovascular: Normal rate, regular rhythm, normal heart sounds and intact distal pulses.   Pulmonary/Chest: Effort normal and breath sounds normal.  Abdominal: Soft. Bowel sounds are normal. She exhibits no mass. There is no tenderness.  Musculoskeletal: Normal range of motion. She exhibits edema. She exhibits no tenderness.  Lymphadenopathy:    She has no cervical adenopathy.  Neurological: She is alert and oriented to person, place, and time.  Skin: Skin is warm and dry. No rash noted.  Psychiatric: She has a normal mood and affect. Her behavior is normal.          Assessment & Plan:   Diabetes mellitus. We'll check a hemoglobin A1c. Appears to be well-controlled Hypertension stable Osteoarthritis Chronic anxiety   Check hemoglobin A1c Recheck 6 months Flu vaccine encouraged but declined

## 2013-06-03 ENCOUNTER — Other Ambulatory Visit: Payer: Self-pay | Admitting: Internal Medicine

## 2013-06-06 ENCOUNTER — Other Ambulatory Visit: Payer: Self-pay | Admitting: *Deleted

## 2013-06-06 MED ORDER — DIAZEPAM 2 MG PO TABS: 2.0000 mg | ORAL_TABLET | Freq: Four times a day (QID) | ORAL | Status: DC | PRN

## 2013-08-18 ENCOUNTER — Ambulatory Visit: Payer: Medicare Other

## 2013-09-06 ENCOUNTER — Other Ambulatory Visit: Payer: Self-pay | Admitting: *Deleted

## 2013-09-06 MED ORDER — BUTALBITAL-APAP-CAFFEINE 50-300-40 MG PO CAPS: 1.0000 | ORAL_CAPSULE | Freq: Every day | ORAL | Status: DC

## 2013-10-20 ENCOUNTER — Ambulatory Visit (INDEPENDENT_AMBULATORY_CARE_PROVIDER_SITE_OTHER): Payer: Medicare Other

## 2013-10-20 VITALS — BP 122/63 | HR 69 | Resp 16

## 2013-10-20 DIAGNOSIS — E1149 Type 2 diabetes mellitus with other diabetic neurological complication: Secondary | ICD-10-CM | POA: Diagnosis not present

## 2013-10-20 DIAGNOSIS — E1140 Type 2 diabetes mellitus with diabetic neuropathy, unspecified: Secondary | ICD-10-CM

## 2013-10-20 DIAGNOSIS — E1142 Type 2 diabetes mellitus with diabetic polyneuropathy: Secondary | ICD-10-CM | POA: Diagnosis not present

## 2013-10-20 DIAGNOSIS — L608 Other nail disorders: Secondary | ICD-10-CM

## 2013-10-20 NOTE — Patient Instructions (Signed)
Diabetes and Foot Care Diabetes may cause you to have problems because of poor blood supply (circulation) to your feet and legs. This may cause the skin on your feet to become thinner, break easier, and heal more slowly. Your skin may become dry, and the skin may peel and crack. You may also have nerve damage in your legs and feet causing decreased feeling in them. You may not notice minor injuries to your feet that could lead to infections or more serious problems. Taking care of your feet is one of the most important things you can do for yourself.  HOME CARE INSTRUCTIONS  Wear shoes at all times, even in the house. Do not go barefoot. Bare feet are easily injured.  Check your feet daily for blisters, cuts, and redness. If you cannot see the bottom of your feet, use a mirror or ask someone for help.  Wash your feet with warm water (do not use hot water) and mild soap. Then pat your feet and the areas between your toes until they are completely dry. Do not soak your feet as this can dry your skin.  Apply a moisturizing lotion or petroleum jelly (that does not contain alcohol and is unscented) to the skin on your feet and to dry, brittle toenails. Do not apply lotion between your toes.  Trim your toenails straight across. Do not dig under them or around the cuticle. File the edges of your nails with an emery board or nail file.  Do not cut corns or calluses or try to remove them with medicine.  Wear clean socks or stockings every day. Make sure they are not too tight. Do not wear knee-high stockings since they may decrease blood flow to your legs.  Wear shoes that fit properly and have enough cushioning. To break in new shoes, wear them for just a few hours a day. This prevents you from injuring your feet. Always look in your shoes before you put them on to be sure there are no objects inside.  Do not cross your legs. This may decrease the blood flow to your feet.  If you find a minor scrape,  cut, or break in the skin on your feet, keep it and the skin around it clean and dry. These areas may be cleansed with mild soap and water. Do not cleanse the area with peroxide, alcohol, or iodine.  When you remove an adhesive bandage, be sure not to damage the skin around it.  If you have a wound, look at it several times a day to make sure it is healing.  Do not use heating pads or hot water bottles. They may burn your skin. If you have lost feeling in your feet or legs, you may not know it is happening until it is too late.  Make sure your health care provider performs a complete foot exam at least annually or more often if you have foot problems. Report any cuts, sores, or bruises to your health care provider immediately. SEEK MEDICAL CARE IF:   You have an injury that is not healing.  You have cuts or breaks in the skin.  You have an ingrown nail.  You notice redness on your legs or feet.  You feel burning or tingling in your legs or feet.  You have pain or cramps in your legs and feet.  Your legs or feet are numb.  Your feet always feel cold. SEEK IMMEDIATE MEDICAL CARE IF:   There is increasing redness,   swelling, or pain in or around a wound.  There is a red line that goes up your leg.  Pus is coming from a wound.  You develop a fever or as directed by your health care provider.  You notice a bad smell coming from an ulcer or wound. Document Released: 07/10/2000 Document Revised: 03/15/2013 Document Reviewed: 12/20/2012 [employer] Patient Information 2014 [employer]  

## 2013-10-20 NOTE — Progress Notes (Signed)
   Subjective:    Patient ID: Christie [name], female    DOB: September 05, 1920, 80 y.o.   MRN: [id number]  HPI Comments: "Trim my toenails"     Review of Systems no new changes or find     Objective:   Physical Exam Vascular status is intact pedal pulses DP plus one over 4 PT thready nonpalpable bilateral absent hair growth diminished diminished skin texture and turgor plus one edema the ankle areas  nails criptotic incurvated friable 1 through 5 bilateral no open wounds ulcerations slight distal clavus second digit left foot Neurologically epicritic and proprioceptive sensations diminished on Semmes Weinstein testing to plantar response DTRs not elicited     Assessment & Plan:  Assessment diabetes with peripheral neuropathy thick friable dystrophic nails debrided 1 through 5 bilateral return for future palliative care Christie [name] months or as needed next  Standard Christie [name] DP

## 2013-11-15 ENCOUNTER — Telehealth: Payer: Self-pay | Admitting: Internal Medicine

## 2013-11-15 ENCOUNTER — Ambulatory Visit (INDEPENDENT_AMBULATORY_CARE_PROVIDER_SITE_OTHER): Payer: Medicare Other | Admitting: Internal Medicine

## 2013-11-15 ENCOUNTER — Encounter: Payer: Self-pay | Admitting: Internal Medicine

## 2013-11-15 VITALS — BP 102/60 | HR 64 | Temp 97.9°F | Resp 18 | Ht 61.0 in | Wt 130.0 lb

## 2013-11-15 DIAGNOSIS — I10 Essential (primary) hypertension: Secondary | ICD-10-CM

## 2013-11-15 DIAGNOSIS — M1990 Unspecified osteoarthritis, unspecified site: Secondary | ICD-10-CM

## 2013-11-15 DIAGNOSIS — Z8639 Personal history of other endocrine, nutritional and metabolic disease: Secondary | ICD-10-CM

## 2013-11-15 DIAGNOSIS — F411 Generalized anxiety disorder: Secondary | ICD-10-CM | POA: Diagnosis not present

## 2013-11-15 DIAGNOSIS — E785 Hyperlipidemia, unspecified: Secondary | ICD-10-CM

## 2013-11-15 DIAGNOSIS — Z862 Personal history of diseases of the blood and blood-forming organs and certain disorders involving the immune mechanism: Secondary | ICD-10-CM

## 2013-11-15 DIAGNOSIS — E119 Type 2 diabetes mellitus without complications: Secondary | ICD-10-CM | POA: Diagnosis not present

## 2013-11-15 LAB — CBC WITH DIFFERENTIAL/PLATELET
BASOS ABS: 0 10*3/uL (ref 0.0–0.1)
Basophils Relative: 0.6 % (ref 0.0–3.0)
Eosinophils Absolute: 0.1 10*3/uL (ref 0.0–0.7)
Eosinophils Relative: 1.9 % (ref 0.0–5.0)
HCT: 38.1 % (ref 36.0–46.0)
HEMOGLOBIN: 12.7 g/dL (ref 12.0–15.0)
LYMPHS PCT: 26.7 % (ref 12.0–46.0)
Lymphs Abs: 1 10*3/uL (ref 0.7–4.0)
MCHC: 33.3 g/dL (ref 30.0–36.0)
MCV: 80.4 fl (ref 78.0–100.0)
MONOS PCT: 15 % — AB (ref 3.0–12.0)
Monocytes Absolute: 0.6 10*3/uL (ref 0.1–1.0)
NEUTROS ABS: 2.2 10*3/uL (ref 1.4–7.7)
Neutrophils Relative %: 55.8 % (ref 43.0–77.0)
RBC: 4.74 Mil/uL (ref 3.87–5.11)
RDW: 17.2 % — AB (ref 11.5–14.6)
WBC: 3.9 10*3/uL — AB (ref 4.5–10.5)

## 2013-11-15 LAB — COMPREHENSIVE METABOLIC PANEL
ALT: 13 U/L (ref 0–35)
AST: 20 U/L (ref 0–37)
Albumin: 3.2 g/dL — ABNORMAL LOW (ref 3.5–5.2)
Alkaline Phosphatase: 57 U/L (ref 39–117)
BILIRUBIN TOTAL: 0.2 mg/dL — AB (ref 0.3–1.2)
BUN: 33 mg/dL — AB (ref 6–23)
CO2: 27 meq/L (ref 19–32)
Calcium: 9.1 mg/dL (ref 8.4–10.5)
Chloride: 107 mEq/L (ref 96–112)
Creatinine, Ser: 0.8 mg/dL (ref 0.4–1.2)
GFR: 81.8 mL/min (ref >60.00)
GLUCOSE: 96 mg/dL (ref 70–99)
Potassium: 3.9 mEq/L (ref 3.5–5.1)
SODIUM: 143 meq/L (ref 135–145)
Total Protein: 6.5 g/dL (ref 6.0–8.3)

## 2013-11-15 LAB — TSH: TSH: 0.81 u[IU]/mL (ref 0.35–5.50)

## 2013-11-15 LAB — HEMOGLOBIN A1C: HEMOGLOBIN A1C: 6.8 % — AB (ref 4.6–6.5)

## 2013-11-15 MED ORDER — BUTALBITAL-APAP-CAFFEINE 50-300-40 MG PO CAPS: 1.0000 | ORAL_CAPSULE | Freq: Every day | ORAL | Status: DC

## 2013-11-15 MED ORDER — DIAZEPAM 2 MG PO TABS: 2.0000 mg | ORAL_TABLET | Freq: Four times a day (QID) | ORAL | Status: DC | PRN

## 2013-11-15 NOTE — Progress Notes (Signed)
Subjective:    Patient ID: Christie [name], female    DOB: 1920-12-17, 80 y.o.   MRN: [id number]  HPI 80 -year-old patient who is seen today for followup.  She has type 2 diabetes.  Her last hemoglobin A1c was elevated at 6 months ago, but prior to this have been less than 7.  She has treated hypertension, and arthritis.  She has a history of mild dyslipidemia, headaches and mild anxiety.  She essentially is wheelchair bound.  No new concerns or complaints.  No recent eye examination.  She has been evaluated by podiatry recently  Past Medical History  Diagnosis Date  . ANXIETY 01/31/2007  . DIABETES MELLITUS, TYPE II 01/31/2007  . Headache(784.0) 01/31/2007  . HYPERLIPIDEMIA 01/31/2007  . HYPERTENSION 01/31/2007  . HYPOKALEMIA, HX OF 02/09/2008  . OSTEOARTHRITIS 01/31/2007    History   Social History  . Marital Status: Widowed    Spouse Name: N/A    Number of Children: N/A  . Years of Education: N/A   Occupational History  . Not on file.   Social History Main Topics  . Smoking status: Former Games developermoker  . Smokeless tobacco: Never Used  . Alcohol Use: No  . Drug Use: No  . Sexual Activity: Not on file   Other Topics Concern  . Not on file   Social History Narrative  . No narrative on file    Past Surgical History  Procedure Laterality Date  . Abdominal hysterectomy    . Cataract extraction      Family History  Problem Relation Age of Onset  . Cancer Neg Hx     5 sisters - all pos for breast ca  . Heart disease Neg Hx     family    Allergies  Allergen Reactions  . Butalbital-Aspirin-Caffeine   . Codeine     Current Outpatient Prescriptions on File Prior to Visit  Medication Sig Dispense Refill  . glipiZIDE (GLIPIZIDE XL) 5 MG 24 hr tablet Take 1 tablet (5 mg total) by mouth daily.  90 tablet  6  . Glucose Blood (BLOOD GLUCOSE TEST STRIPS) STRP 1 each by Other route daily.  50 each  6  . hydrochlorothiazide (HYDRODIURIL) 25 MG tablet Take 1 tablet (25 mg total) by  mouth daily.  90 tablet  2  . LANCETS ULTRA FINE MISC 1 each by Does not apply route daily.  100 each  3  . metFORMIN (GLUCOPHAGE-XR) 500 MG 24 hr tablet Take 2 tablets (1,000 mg total) by mouth daily with supper.  90 tablet  4  . potassium chloride SA (K-DUR) 20 MEQ tablet Take 1 tablet (20 mEq total) by mouth daily.  90 tablet  4  . verapamil (CALAN SR) 180 MG CR tablet Take 1 tablet (180 mg total) by mouth at bedtime.  90 tablet  6   No current facility-administered medications on file prior to visit.    BP 102/60  Pulse 64  Temp(Src) 97.9 F (36.6 C) (Oral)  Resp 18  Ht 5\' 1"  (1.549 m)  Wt 130 lb (58.968 kg)  BMI 24.58 kg/m2       Review of Systems  Constitutional: Negative.   HENT: Negative for congestion, dental problem, hearing loss, rhinorrhea, sinus pressure, sore throat and tinnitus.   Eyes: Negative for pain, discharge and visual disturbance.  Respiratory: Negative for cough and shortness of breath.   Cardiovascular: Negative for chest pain, palpitations and leg swelling.  Gastrointestinal: Negative for nausea, vomiting, abdominal pain,  diarrhea, constipation, blood in stool and abdominal distention.  Genitourinary: Negative for dysuria, urgency, frequency, hematuria, flank pain, vaginal bleeding, vaginal discharge, difficulty urinating, vaginal pain and pelvic pain.  Musculoskeletal: Positive for arthralgias, back pain and gait problem. Negative for joint swelling.  Skin: Negative for rash.  Neurological: Negative for dizziness, syncope, speech difficulty, weakness, numbness and headaches.  Hematological: Negative for adenopathy.  Psychiatric/Behavioral: Negative for behavioral problems, dysphoric mood and agitation. The patient is nervous/anxious.        Objective:   Physical Exam  Constitutional: She is oriented to person, place, and time. She appears well-developed and well-nourished.  Blood pressure 110/60  BP Readings from Last 3 Encounters: 11/15/13 :  102/60 10/20/13 : 122/63 05/17/13 : 120/70  HENT:  Head: Normocephalic.  Right Ear: External ear normal.  Left Ear: External ear normal.  Mouth/Throat: Oropharynx is clear and moist.  Scattered dentition only  Eyes: Conjunctivae and EOM are normal. Pupils are equal, round, and reactive to light.  Arcus senilis  Neck: Normal range of motion. Neck supple. No thyromegaly present.  Cardiovascular: Normal rate, regular rhythm, normal heart sounds and intact distal pulses.   Pulmonary/Chest: Effort normal and breath sounds normal.  Abdominal: Soft. Bowel sounds are normal. She exhibits no mass. There is no tenderness.  Musculoskeletal: Normal range of motion.  Lymphadenopathy:    She has no cervical adenopathy.  Neurological: She is alert and oriented to person, place, and time.  Skin: Skin is warm and dry. No rash noted.  Psychiatric: She has a normal mood and affect. Her behavior is normal.          Assessment & Plan:   Diabetes mellitus.  We'll check a hemoglobin A1c.Eye examination recommended.  We'll check a urine for microalbumin Hypertension.  Blood pressure in a low-normal range.  We'll discontinue hydrochlorothiazide along with potassium supplementation Osteoarthritis.  We'll check some updated lab  Recheck 3 months  Medications refilled

## 2013-11-15 NOTE — Telephone Encounter (Signed)
Relevant patient education mailed to patient.  

## 2013-11-15 NOTE — Patient Instructions (Addendum)
Limit your sodium (Salt) intake   Please check your hemoglobin A1c every 3 months  Please see your eye doctor yearly to check for diabetic eye damage   

## 2013-11-15 NOTE — Progress Notes (Signed)
Pre-visit discussion using our clinic review tool. No additional management support is needed unless otherwise documented below in the visit note.  

## 2013-11-20 ENCOUNTER — Other Ambulatory Visit: Payer: Self-pay | Admitting: Internal Medicine

## 2013-11-24 ENCOUNTER — Telehealth: Payer: Self-pay

## 2013-11-24 NOTE — Telephone Encounter (Signed)
Relevant patient education assigned to patient using Emmi. ° °

## 2013-12-04 ENCOUNTER — Other Ambulatory Visit: Payer: Self-pay | Admitting: Internal Medicine

## 2013-12-06 ENCOUNTER — Telehealth: Payer: Self-pay | Admitting: Internal Medicine

## 2013-12-06 NOTE — Telephone Encounter (Signed)
Error/gd °

## 2013-12-19 ENCOUNTER — Other Ambulatory Visit: Payer: Self-pay | Admitting: Internal Medicine

## 2013-12-20 NOTE — Telephone Encounter (Signed)
Pt needs re-fill on verapamil (CALAN-SR) 180 MG CR tablet  and glipiZIDE (GLIPIZIDE XL) 5 MG 24 hr tablet [facility]

## 2014-01-19 ENCOUNTER — Ambulatory Visit: Payer: Medicare Other

## 2014-02-08 ENCOUNTER — Other Ambulatory Visit: Payer: Self-pay | Admitting: *Deleted

## 2014-02-08 MED ORDER — GLUCOSE BLOOD VI STRP: 1.0000 | ORAL_STRIP | Freq: Every day | Status: DC

## 2014-02-09 ENCOUNTER — Ambulatory Visit: Payer: Medicare Other

## 2014-02-12 ENCOUNTER — Other Ambulatory Visit: Payer: Self-pay | Admitting: Internal Medicine

## 2014-02-14 ENCOUNTER — Ambulatory Visit (INDEPENDENT_AMBULATORY_CARE_PROVIDER_SITE_OTHER): Payer: Medicare Other | Admitting: Internal Medicine

## 2014-02-14 ENCOUNTER — Encounter: Payer: Self-pay | Admitting: Internal Medicine

## 2014-02-14 VITALS — BP 130/62 | HR 68 | Temp 98.6°F | Resp 20 | Ht 61.0 in | Wt 127.0 lb

## 2014-02-14 DIAGNOSIS — I10 Essential (primary) hypertension: Secondary | ICD-10-CM | POA: Diagnosis not present

## 2014-02-14 DIAGNOSIS — E119 Type 2 diabetes mellitus without complications: Secondary | ICD-10-CM | POA: Diagnosis not present

## 2014-02-14 DIAGNOSIS — M1990 Unspecified osteoarthritis, unspecified site: Secondary | ICD-10-CM | POA: Diagnosis not present

## 2014-02-14 MED ORDER — DIAZEPAM 2 MG PO TABS: ORAL_TABLET | ORAL | Status: DC

## 2014-02-14 MED ORDER — BUTALBITAL-APAP-CAFFEINE 50-300-40 MG PO CAPS: 1.0000 | ORAL_CAPSULE | Freq: Every day | ORAL | Status: DC

## 2014-02-14 NOTE — Progress Notes (Signed)
Pre visit review using our clinic review tool, if applicable. No additional management support is needed unless otherwise documented below in the visit note. 

## 2014-02-14 NOTE — Patient Instructions (Signed)
Limit your sodium (Salt) intake   Please check your hemoglobin A1c every 3 months   

## 2014-02-14 NOTE — Progress Notes (Signed)
Subjective:    Patient ID: Christie [name], female    DOB: [birth year], 80 y.o.   MRN: [id number]  HPI 15 -year-old patient who is seen for followup.  Medical problems include type 2 diabetes, osteoarthritis, dyslipidemia.  She is doing quite well.  She does monitor occasional blood sugars with nice results.  Her last hemoglobin A1c 6 point 8.  She is scheduled to have an eye exam next month. She also has a history of mild anxiety, which has been stable. No cardiopulmonary complaints. Chest of arthritis, especially involving the knees.  Past Medical History  Diagnosis Date  . ANXIETY 01/31/2007  . DIABETES MELLITUS, TYPE II 01/31/2007  . Headache(784.0) 01/31/2007  . HYPERLIPIDEMIA 01/31/2007  . HYPERTENSION 01/31/2007  . HYPOKALEMIA, HX OF 02/09/2008  . OSTEOARTHRITIS 01/31/2007    History   Social History  . Marital Status: Widowed    Spouse Name: N/A    Number of Children: N/A  . Years of Education: N/A   Occupational History  . Not on file.   Social History Main Topics  . Smoking status: Former Games developer  . Smokeless tobacco: Never Used  . Alcohol Use: No  . Drug Use: No  . Sexual Activity: Not on file   Other Topics Concern  . Not on file   Social History Narrative  . No narrative on file    Past Surgical History  Procedure Laterality Date  . Abdominal hysterectomy    . Cataract extraction      Family History  Problem Relation Age of Onset  . Cancer Neg Hx     5 sisters - all pos for breast ca  . Heart disease Neg Hx     family    Allergies  Allergen Reactions  . Butalbital-Aspirin-Caffeine   . Codeine     Current Outpatient Prescriptions on File Prior to Visit  Medication Sig Dispense Refill  . GLIPIZIDE XL 5 MG 24 hr tablet TAKE 1 TABLET ONCE DAILY.  90 tablet  1  . glucose blood test strip 1 each by Other route daily.  50 each  12  . LANCETS ULTRA FINE MISC 1 each by Does not apply route daily.  100 each  3  . metFORMIN (GLUCOPHAGE-XR) 500 MG 24 hr  tablet Take 2 tablets (1,000 mg total) by mouth daily with supper.  90 tablet  4  . verapamil (CALAN-SR) 180 MG CR tablet TAKE ONE TABLET AT BEDTIME.  30 tablet  5   No current facility-administered medications on file prior to visit.    BP 130/62  Pulse 68  Temp(Src) 98.6 F (37 C) (Oral)  Resp 20  Ht 5\' 1"  (1.549 m)  Wt 127 lb (57.607 kg)  BMI 24.01 kg/m2  SpO2 99%      Review of Systems  Constitutional: Negative.   HENT: Negative for congestion, dental problem, hearing loss, rhinorrhea, sinus pressure, sore throat and tinnitus.   Eyes: Negative for pain, discharge and visual disturbance.  Respiratory: Negative for cough and shortness of breath.   Cardiovascular: Negative for chest pain, palpitations and leg swelling.  Gastrointestinal: Negative for nausea, vomiting, abdominal pain, diarrhea, constipation, blood in stool and abdominal distention.  Genitourinary: Negative for dysuria, urgency, frequency, hematuria, flank pain, vaginal bleeding, vaginal discharge, difficulty urinating, vaginal pain and pelvic pain.  Musculoskeletal: Positive for arthralgias and gait problem. Negative for joint swelling.  Skin: Negative for rash.  Neurological: Negative for dizziness, syncope, speech difficulty, weakness, numbness and headaches.  Hematological: Negative  for adenopathy.  Psychiatric/Behavioral: Negative for behavioral problems, dysphoric mood and agitation. The patient is not nervous/anxious.        Objective:   Physical Exam  Constitutional: She is oriented to person, place, and time. She appears well-developed and well-nourished.  HENT:  Head: Normocephalic.  Right Ear: External ear normal.  Left Ear: External ear normal.  Mouth/Throat: Oropharynx is clear and moist.  Eyes: Conjunctivae and EOM are normal. Pupils are equal, round, and reactive to light.  Neck: Normal range of motion. Neck supple. No thyromegaly present.  Cardiovascular: Normal rate, regular rhythm and  normal heart sounds.   Pulmonary/Chest: Effort normal and breath sounds normal.  Abdominal: Soft. Bowel sounds are normal. She exhibits no mass. There is no tenderness.  Musculoskeletal: Normal range of motion.  Lymphadenopathy:    She has no cervical adenopathy.  Neurological: She is alert and oriented to person, place, and time.  Skin: Skin is warm and dry. No rash noted.  Psychiatric: She has a normal mood and affect. Her behavior is normal.          Assessment & Plan:   Diabetes mellitus.  Well controlled.  We'll continue present regimen Hypertension stable Osteoarthritis Dyslipidemia  Recheck 3 months

## 2014-02-15 ENCOUNTER — Telehealth: Payer: Self-pay | Admitting: Internal Medicine

## 2014-02-15 NOTE — Telephone Encounter (Signed)
Relevant patient education assigned to patient using Emmi. ° °

## 2014-02-23 ENCOUNTER — Ambulatory Visit: Payer: Medicare Other

## 2014-03-09 ENCOUNTER — Ambulatory Visit: Payer: Medicare Other

## 2014-03-13 ENCOUNTER — Ambulatory Visit (INDEPENDENT_AMBULATORY_CARE_PROVIDER_SITE_OTHER): Payer: Medicare Other

## 2014-03-13 VITALS — BP 149/66 | HR 72 | Resp 15 | Ht 60.0 in | Wt 140.0 lb

## 2014-03-13 DIAGNOSIS — E1142 Type 2 diabetes mellitus with diabetic polyneuropathy: Secondary | ICD-10-CM

## 2014-03-13 DIAGNOSIS — L608 Other nail disorders: Secondary | ICD-10-CM

## 2014-03-13 DIAGNOSIS — E1149 Type 2 diabetes mellitus with other diabetic neurological complication: Secondary | ICD-10-CM | POA: Diagnosis not present

## 2014-03-13 DIAGNOSIS — E1140 Type 2 diabetes mellitus with diabetic neuropathy, unspecified: Secondary | ICD-10-CM

## 2014-03-13 NOTE — Patient Instructions (Signed)
Diabetes and Foot Care Diabetes may cause you to have problems because of poor blood supply (circulation) to your feet and legs. This may cause the skin on your feet to become thinner, break easier, and heal more slowly. Your skin may become dry, and the skin may peel and crack. You may also have nerve damage in your legs and feet causing decreased feeling in them. You may not notice minor injuries to your feet that could lead to infections or more serious problems. Taking care of your feet is one of the most important things you can do for yourself.  HOME CARE INSTRUCTIONS  Wear shoes at all times, even in the house. Do not go barefoot. Bare feet are easily injured.  Check your feet daily for blisters, cuts, and redness. If you cannot see the bottom of your feet, use a mirror or ask someone for help.  Wash your feet with warm water (do not use hot water) and mild soap. Then pat your feet and the areas between your toes until they are completely dry. Do not soak your feet as this can dry your skin.  Apply a moisturizing lotion or petroleum jelly (that does not contain alcohol and is unscented) to the skin on your feet and to dry, brittle toenails. Do not apply lotion between your toes.  Trim your toenails straight across. Do not dig under them or around the cuticle. File the edges of your nails with an emery board or nail file.  Do not cut corns or calluses or try to remove them with medicine.  Wear clean socks or stockings every day. Make sure they are not too tight. Do not wear knee-high stockings since they may decrease blood flow to your legs.  Wear shoes that fit properly and have enough cushioning. To break in new shoes, wear them for just a few hours a day. This prevents you from injuring your feet. Always look in your shoes before you put them on to be sure there are no objects inside.  Do not cross your legs. This may decrease the blood flow to your feet.  If you find a minor scrape,  cut, or break in the skin on your feet, keep it and the skin around it clean and dry. These areas may be cleansed with mild soap and water. Do not cleanse the area with peroxide, alcohol, or iodine.  When you remove an adhesive bandage, be sure not to damage the skin around it.  If you have a wound, look at it several times a day to make sure it is healing.  Do not use heating pads or hot water bottles. They may burn your skin. If you have lost feeling in your feet or legs, you may not know it is happening until it is too late.  Make sure your health care provider performs a complete foot exam at least annually or more often if you have foot problems. Report any cuts, sores, or bruises to your health care provider immediately. SEEK MEDICAL CARE IF:   You have an injury that is not healing.  You have cuts or breaks in the skin.  You have an ingrown nail.  You notice redness on your legs or feet.  You feel burning or tingling in your legs or feet.  You have pain or cramps in your legs and feet.  Your legs or feet are numb.  Your feet always feel cold. SEEK IMMEDIATE MEDICAL CARE IF:   There is increasing redness,   swelling, or pain in or around a wound.  There is a red line that goes up your leg.  Pus is coming from a wound.  You develop a fever or as directed by your health care provider.  You notice a bad smell coming from an ulcer or wound. Document Released: 07/10/2000 Document Revised: 03/15/2013 Document Reviewed: 12/20/2012 Exit[employer] Patient Information 2015 [employer]. This information is not intended to replace advice given to you by your health care provider. Make sure you discuss any questions you have with your health care provider.  

## 2014-03-13 NOTE — Progress Notes (Signed)
   Subjective:    Patient ID: Christie [name], female    DOB: 1921/05/03, 80 y.o.   MRN: [id number]  HPI patient presents this time for diabetic nail care and debridement    Review of Systems  no systemic findings or changes noted     Objective:   Physical Exam  vascular status is intact DP and PT one over 4 bilateral nonpalpable PT posterior the mild +1 edema noted bilateral. Mild varicosities noted epicritic sensation diminished on Semmes Weinstein testing to forefoot and digits. There is normal plantar response DTRs not elicited. Open wounds ulcerations no secondary infections nails thick criptotic discolored brittle one through again no significant keratoses mild flexible digital contractures are noted       Assessment & Plan:  Assessment diabetes with history peripheral neuropathy of dystrophic friable criptotic nails debrided 1 through 5 bilateral return in 3 months for continued palliative care in the future as needed next  Alvan Dameichard Tasha Diaz DPM

## 2014-04-09 ENCOUNTER — Other Ambulatory Visit: Payer: Self-pay | Admitting: Internal Medicine

## 2014-05-18 ENCOUNTER — Other Ambulatory Visit: Payer: Medicare Other

## 2014-05-18 ENCOUNTER — Ambulatory Visit (INDEPENDENT_AMBULATORY_CARE_PROVIDER_SITE_OTHER): Payer: Medicare Other | Admitting: Internal Medicine

## 2014-05-18 ENCOUNTER — Encounter: Payer: Self-pay | Admitting: Internal Medicine

## 2014-05-18 VITALS — BP 112/60 | HR 77 | Temp 98.2°F | Resp 18 | Ht 60.0 in | Wt 128.0 lb

## 2014-05-18 DIAGNOSIS — I10 Essential (primary) hypertension: Secondary | ICD-10-CM | POA: Diagnosis not present

## 2014-05-18 DIAGNOSIS — R634 Abnormal weight loss: Secondary | ICD-10-CM

## 2014-05-18 DIAGNOSIS — E119 Type 2 diabetes mellitus without complications: Secondary | ICD-10-CM

## 2014-05-18 DIAGNOSIS — M150 Primary generalized (osteo)arthritis: Secondary | ICD-10-CM

## 2014-05-18 DIAGNOSIS — M159 Polyosteoarthritis, unspecified: Secondary | ICD-10-CM

## 2014-05-18 DIAGNOSIS — D509 Iron deficiency anemia, unspecified: Secondary | ICD-10-CM

## 2014-05-18 LAB — CBC WITH DIFFERENTIAL/PLATELET
Basophils Absolute: 0 10*3/uL (ref 0.0–0.1)
Basophils Relative: 0.4 % (ref 0.0–3.0)
EOS ABS: 0 10*3/uL (ref 0.0–0.7)
Eosinophils Relative: 0.5 % (ref 0.0–5.0)
HCT: 30.5 % — ABNORMAL LOW (ref 36.0–46.0)
HEMOGLOBIN: 9.6 g/dL — AB (ref 12.0–15.0)
Lymphocytes Relative: 13.2 % (ref 12.0–46.0)
Lymphs Abs: 0.7 10*3/uL (ref 0.7–4.0)
MCHC: 31.3 g/dL (ref 30.0–36.0)
MCV: 81.1 fl (ref 78.0–100.0)
MONOS PCT: 12.5 % — AB (ref 3.0–12.0)
Monocytes Absolute: 0.7 10*3/uL (ref 0.1–1.0)
NEUTROS ABS: 4.2 10*3/uL (ref 1.4–7.7)
Neutrophils Relative %: 73.4 % (ref 43.0–77.0)
Platelets: 226 10*3/uL (ref 150.0–400.0)
RBC: 3.76 Mil/uL — ABNORMAL LOW (ref 3.87–5.11)
RDW: 16.3 % — AB (ref 11.5–15.5)
WBC: 5.7 10*3/uL (ref 4.0–10.5)

## 2014-05-18 LAB — TSH: TSH: 1.3 u[IU]/mL (ref 0.35–4.50)

## 2014-05-18 LAB — COMPREHENSIVE METABOLIC PANEL
ALT: 11 U/L (ref 0–35)
AST: 14 U/L (ref 0–37)
Albumin: 2.8 g/dL — ABNORMAL LOW (ref 3.5–5.2)
Alkaline Phosphatase: 64 U/L (ref 39–117)
BILIRUBIN TOTAL: 0.4 mg/dL (ref 0.2–1.2)
BUN: 19 mg/dL (ref 6–23)
CHLORIDE: 102 meq/L (ref 96–112)
CO2: 24 meq/L (ref 19–32)
CREATININE: 0.8 mg/dL (ref 0.4–1.2)
Calcium: 9.1 mg/dL (ref 8.4–10.5)
GFR: 82.85 mL/min (ref >60.00)
GLUCOSE: 236 mg/dL — AB (ref 70–99)
Potassium: 3.3 mEq/L — ABNORMAL LOW (ref 3.5–5.1)
Sodium: 137 mEq/L (ref 135–145)
TOTAL PROTEIN: 7.2 g/dL (ref 6.0–8.3)

## 2014-05-18 LAB — SEDIMENTATION RATE: Sed Rate: 45 mm/hr — ABNORMAL HIGH (ref 0–22)

## 2014-05-18 LAB — HEMOGLOBIN A1C: Hgb A1c MFr Bld: 6.6 % — ABNORMAL HIGH (ref 4.6–6.5)

## 2014-05-18 MED ORDER — DIAZEPAM 2 MG PO TABS: ORAL_TABLET | ORAL | Status: DC

## 2014-05-18 MED ORDER — BUTALBITAL-APAP-CAFFEINE 50-300-40 MG PO CAPS: 1.0000 | ORAL_CAPSULE | Freq: Every day | ORAL | Status: DC

## 2014-05-18 NOTE — Progress Notes (Signed)
Pre visit review using our clinic review tool, if applicable. No additional management support is needed unless otherwise documented below in the visit note. 

## 2014-05-18 NOTE — Patient Instructions (Signed)
Limit your sodium (Salt) intake  Return in 3 months for follow-up   

## 2014-05-18 NOTE — Progress Notes (Signed)
Subjective:    Patient ID: Christie [name], female    DOB: 02-Sep-1920, 80 y.o.   MRN: [id number]  HPI Wt Readings from Last 3 Encounters:  05/18/14 128 lb (58.06 kg)  03/13/14 140 lb (63.504 kg)  02/14/14 127 lb (57.55607 kg)   80 year old patient who is seen today for followup of type 2 diabetes.  There has been some questionable weight loss since her last visit.  She has had some anorexia and has been using meal substitutes. She has osteoarthritis. Her daughter has held one of her diabetic medications, but unclear of which 1.  No hypoglycemia Arthritis and anxiety seem well-controlled  Past Medical History  Diagnosis Date  . ANXIETY 01/31/2007  . DIABETES MELLITUS, TYPE II 01/31/2007  . Headache(784.0) 01/31/2007  . HYPERLIPIDEMIA 01/31/2007  . HYPERTENSION 01/31/2007  . HYPOKALEMIA, HX OF 02/09/2008  . OSTEOARTHRITIS 01/31/2007    History   Social History  . Marital Status: Widowed    Spouse Name: N/A    Number of Children: N/A  . Years of Education: N/A   Occupational History  . Not on file.   Social History Main Topics  . Smoking status: Former Games developermoker  . Smokeless tobacco: Never Used  . Alcohol Use: No  . Drug Use: No  . Sexual Activity: Not on file   Other Topics Concern  . Not on file   Social History Narrative  . No narrative on file    Past Surgical History  Procedure Laterality Date  . Abdominal hysterectomy    . Cataract extraction      Family History  Problem Relation Age of Onset  . Cancer Neg Hx     5 sisters - all pos for breast ca  . Heart disease Neg Hx     family    Allergies  Allergen Reactions  . Butalbital-Aspirin-Caffeine   . Codeine     Current Outpatient Prescriptions on File Prior to Visit  Medication Sig Dispense Refill  . GLIPIZIDE XL 5 MG 24 hr tablet TAKE 1 TABLET ONCE DAILY.  90 tablet  1  . glucose blood test strip 1 each by Other route daily.  50 each  12  . LANCETS ULTRA FINE MISC 1 each by Does not apply route daily.  100  each  3  . metFORMIN (GLUCOPHAGE-XR) 500 MG 24 hr tablet TAKE 2 TABLETS DAILY AS DIRECTED.  180 tablet  0  . verapamil (CALAN-SR) 180 MG CR tablet TAKE ONE TABLET AT BEDTIME.  30 tablet  5   No current facility-administered medications on file prior to visit.    BP 112/60  Pulse 77  Temp(Src) 98.2 F (36.8 C) (Oral)  Resp 18  Ht 5' (1.524 m)  Wt 128 lb (58.06 kg)  BMI 25.00 kg/m2  SpO2 95%     Review of Systems  Constitutional: Positive for appetite change and unexpected weight change.  HENT: Negative for congestion, dental problem, hearing loss, rhinorrhea, sinus pressure, sore throat and tinnitus.   Eyes: Negative for pain, discharge and visual disturbance.  Respiratory: Negative for cough and shortness of breath.   Cardiovascular: Negative for chest pain, palpitations and leg swelling.  Gastrointestinal: Negative for nausea, vomiting, abdominal pain, diarrhea, constipation, blood in stool and abdominal distention.  Genitourinary: Negative for dysuria, urgency, frequency, hematuria, flank pain, vaginal bleeding, vaginal discharge, difficulty urinating, vaginal pain and pelvic pain.  Musculoskeletal: Negative for arthralgias, gait problem and joint swelling.  Skin: Negative for rash.  Neurological: Negative for dizziness,  syncope, speech difficulty, weakness, numbness and headaches.  Hematological: Negative for adenopathy.  Psychiatric/Behavioral: Negative for behavioral problems, dysphoric mood and agitation. The patient is not nervous/anxious.        Objective:   Physical Exam  Constitutional: She is oriented to person, place, and time. She appears well-developed and well-nourished.  HENT:  Head: Normocephalic.  Right Ear: External ear normal.  Left Ear: External ear normal.  Mouth/Throat: Oropharynx is clear and moist.  Eyes: Conjunctivae and EOM are normal. Pupils are equal, round, and reactive to light.  Neck: Normal range of motion. Neck supple. No thyromegaly  present.  Cardiovascular: Normal rate, regular rhythm and intact distal pulses.   Murmur heard. Pulmonary/Chest: Effort normal and breath sounds normal.  Abdominal: Soft. Bowel sounds are normal. She exhibits no mass. There is no tenderness.  Musculoskeletal: Normal range of motion.  Lymphadenopathy:    She has no cervical adenopathy.  Neurological: She is alert and oriented to person, place, and time.  Skin: Skin is warm and dry. No rash noted.  Psychiatric: She has a normal mood and affect. Her behavior is normal.          Assessment & Plan:   Hypertension well controlled Diabetes mellitus.  We'll check a hemoglobin A1c Possible weight loss.  We'll check followup lab Osteoarthritis  Re Check 3 months

## 2014-05-22 ENCOUNTER — Telehealth: Payer: Self-pay | Admitting: *Deleted

## 2014-05-22 ENCOUNTER — Other Ambulatory Visit (INDEPENDENT_AMBULATORY_CARE_PROVIDER_SITE_OTHER): Payer: Medicare Other

## 2014-05-22 DIAGNOSIS — D62 Acute posthemorrhagic anemia: Secondary | ICD-10-CM

## 2014-05-22 NOTE — Telephone Encounter (Signed)
Nana received paper from Lab that Ferritin and B 12 could not be added on to pt's lab work done on 10/23. Dr.K notified, he said will wait till next visit.

## 2014-05-23 LAB — VITAMIN B12: VITAMIN B 12: 671 pg/mL (ref 211–911)

## 2014-05-23 LAB — FERRITIN: Ferritin: 18.9 ng/mL (ref 10.0–291.0)

## 2014-06-05 ENCOUNTER — Encounter: Payer: Self-pay | Admitting: Internal Medicine

## 2014-06-05 ENCOUNTER — Ambulatory Visit (INDEPENDENT_AMBULATORY_CARE_PROVIDER_SITE_OTHER): Payer: Medicare Other | Admitting: Internal Medicine

## 2014-06-05 ENCOUNTER — Encounter: Payer: Self-pay | Admitting: *Deleted

## 2014-06-05 VITALS — BP 100/62 | HR 98 | Temp 99.2°F | Resp 18 | Ht 60.0 in | Wt 128.0 lb

## 2014-06-05 DIAGNOSIS — M159 Polyosteoarthritis, unspecified: Secondary | ICD-10-CM

## 2014-06-05 DIAGNOSIS — E119 Type 2 diabetes mellitus without complications: Secondary | ICD-10-CM

## 2014-06-05 DIAGNOSIS — R634 Abnormal weight loss: Secondary | ICD-10-CM | POA: Diagnosis not present

## 2014-06-05 DIAGNOSIS — M150 Primary generalized (osteo)arthritis: Secondary | ICD-10-CM

## 2014-06-05 DIAGNOSIS — D500 Iron deficiency anemia secondary to blood loss (chronic): Secondary | ICD-10-CM

## 2014-06-05 DIAGNOSIS — I10 Essential (primary) hypertension: Secondary | ICD-10-CM | POA: Diagnosis not present

## 2014-06-05 LAB — CBC WITH DIFFERENTIAL/PLATELET
Basophils Absolute: 0 10*3/uL (ref 0.0–0.1)
Basophils Relative: 0.4 % (ref 0.0–3.0)
EOS PCT: 1.5 % (ref 0.0–5.0)
Eosinophils Absolute: 0.1 10*3/uL (ref 0.0–0.7)
HCT: 31.6 % — ABNORMAL LOW (ref 36.0–46.0)
Hemoglobin: 9.9 g/dL — ABNORMAL LOW (ref 12.0–15.0)
LYMPHS PCT: 25.3 % (ref 12.0–46.0)
Lymphs Abs: 1.3 10*3/uL (ref 0.7–4.0)
MCHC: 31.4 g/dL (ref 30.0–36.0)
MCV: 77.9 fl — ABNORMAL LOW (ref 78.0–100.0)
Monocytes Absolute: 0.7 10*3/uL (ref 0.1–1.0)
Monocytes Relative: 14.3 % — ABNORMAL HIGH (ref 3.0–12.0)
NEUTROS PCT: 58.5 % (ref 43.0–77.0)
Neutro Abs: 3 10*3/uL (ref 1.4–7.7)
Platelets: 157 10*3/uL (ref 150.0–400.0)
RBC: 4.06 Mil/uL (ref 3.87–5.11)
RDW: 16.5 % — ABNORMAL HIGH (ref 11.5–15.5)
WBC: 5.1 10*3/uL (ref 4.0–10.5)

## 2014-06-05 LAB — COMPREHENSIVE METABOLIC PANEL
ALBUMIN: 2.8 g/dL — AB (ref 3.5–5.2)
ALT: 12 U/L (ref 0–35)
AST: 15 U/L (ref 0–37)
Alkaline Phosphatase: 83 U/L (ref 39–117)
BUN: 23 mg/dL (ref 6–23)
CALCIUM: 9.2 mg/dL (ref 8.4–10.5)
CHLORIDE: 102 meq/L (ref 96–112)
CO2: 25 meq/L (ref 19–32)
CREATININE: 0.8 mg/dL (ref 0.4–1.2)
GFR: 85.2 mL/min (ref >60.00)
GLUCOSE: 353 mg/dL — AB (ref 70–99)
POTASSIUM: 4.2 meq/L (ref 3.5–5.1)
Sodium: 139 mEq/L (ref 135–145)
Total Bilirubin: 0.2 mg/dL (ref 0.2–1.2)
Total Protein: 6.9 g/dL (ref 6.0–8.3)

## 2014-06-05 LAB — SEDIMENTATION RATE: Sed Rate: 45 mm/hr — ABNORMAL HIGH (ref 0–22)

## 2014-06-05 LAB — GLUCOSE, POCT (MANUAL RESULT ENTRY): POC GLUCOSE: 295 mg/dL — AB (ref 70–99)

## 2014-06-05 NOTE — Progress Notes (Signed)
 Subjective:    Patient ID: Christie [name], female    DOB: 01/15/1921, 80 y.o.   MRN: [id number]  HPI  Wt Readings from Last 3 Encounters:  06/05/14 128 lb (58.06 kg)  05/18/14 128 lb (58.06 kg)  03/13/14 140 lb (63.504 kg)    80-year-old patient who is seen today in follow-up.  She was noted recently to have moderate anemia as well as modest elevation in ESR.  She presented with a history of possible weight loss and anorexia.  Her weight has been stable, but her appetite remains poor.  She remains on supplements and very little in the way of solid food.  Blood sugars have been elevated and her daughter has held metformin therapy. Laboratory studies included a normal ferritin level and B12 level.  Attempts at checking stool for occult blood have not been successful at home.  There is some question of some bleeding involving the patient's undergarments.  It is unclear whether this is vaginal or rectal. An attempt was made to transfer the patient to the examining table for better examination, but this was not successful  Past Medical History  Diagnosis Date  . ANXIETY 01/31/2007  . DIABETES MELLITUS, TYPE II 01/31/2007  . Headache(784.0) 01/31/2007  . HYPERLIPIDEMIA 01/31/2007  . HYPERTENSION 01/31/2007  . HYPOKALEMIA, HX OF 02/09/2008  . OSTEOARTHRITIS 01/31/2007    History   Social History  . Marital Status: Widowed    Spouse Name: N/A    Number of Children: N/A  . Years of Education: N/A   Occupational History  . Not on file.   Social History Main Topics  . Smoking status: Former Smoker  . Smokeless tobacco: Never Used  . Alcohol Use: No  . Drug Use: No  . Sexual Activity: Not on file   Other Topics Concern  . Not on file   Social History Narrative    Past Surgical History  Procedure Laterality Date  . Abdominal hysterectomy    . Cataract extraction      Family History  Problem Relation Age of Onset  . Cancer Neg Hx     5 sisters - all pos for breast ca  . Heart  disease Neg Hx     family    Allergies  Allergen Reactions  . Codeine     Current Outpatient Prescriptions on File Prior to Visit  Medication Sig Dispense Refill  . Butalbital-APAP-Caffeine 50-300-40 MG CAPS Take 1 tablet by mouth daily. 30 capsule 5  . diazepam (VALIUM) 2 MG tablet TAKE 1 TABLET EVERY 6 HOURS AS NEEDED FOR ANXIETY AND 1 TABLET AT BEDIME AS DIRECTED. 30 tablet 5  . GLIPIZIDE XL 5 MG 24 hr tablet TAKE 1 TABLET ONCE DAILY. 90 tablet 1  . glucose blood test strip 1 each by Other route daily. 50 each 12  . LANCETS ULTRA FINE MISC 1 each by Does not apply route daily. 100 each 3  . metFORMIN (GLUCOPHAGE-XR) 500 MG 24 hr tablet TAKE 2 TABLETS DAILY AS DIRECTED. 180 tablet 0  . verapamil (CALAN-SR) 180 MG CR tablet TAKE ONE TABLET AT BEDTIME. 30 tablet 5   No current facility-administered medications on file prior to visit.    BP 100/62 mmHg  Pulse 98  Temp(Src) 99.2 F (37.3 C) (Oral)  Resp 18  Ht 5' (1.524 m)  Wt 128 lb (58.06 kg)  BMI 25.00 kg/m2     Review of Systems  Constitutional: Negative.   HENT: Negative for congestion, dental problem, hearing   loss, rhinorrhea, sinus pressure, sore throat and tinnitus.   Eyes: Negative for pain, discharge and visual disturbance.  Respiratory: Negative for cough and shortness of breath.   Cardiovascular: Negative for chest pain, palpitations and leg swelling.  Gastrointestinal: Negative for nausea, vomiting, abdominal pain, diarrhea, constipation, blood in stool and abdominal distention.  Genitourinary: Negative for dysuria, urgency, frequency, hematuria, flank pain, vaginal bleeding, vaginal discharge, difficulty urinating, vaginal pain and pelvic pain.  Musculoskeletal: Positive for back pain, arthralgias and gait problem. Negative for joint swelling.  Skin: Negative for rash.  Neurological: Negative for dizziness, syncope, speech difficulty, weakness, numbness and headaches.  Hematological: Negative for adenopathy.    Psychiatric/Behavioral: Negative for behavioral problems, dysphoric mood and agitation. The patient is not nervous/anxious.        Objective:   Physical Exam  Constitutional: She is oriented to person, place, and time. She appears well-developed and well-nourished.  Exam limited due to patient's poor mobility Patient examined in the sitting position in a wheelchair Blood pressure low normal  HENT:  Head: Normocephalic.  Right Ear: External ear normal.  Left Ear: External ear normal.  Mouth/Throat: Oropharynx is clear and moist.  Eyes: Conjunctivae and EOM are normal. Pupils are equal, round, and reactive to light.  Neck: Normal range of motion. Neck supple. No thyromegaly present.  Cardiovascular: Normal rate, regular rhythm, normal heart sounds and intact distal pulses.   Pulmonary/Chest: Effort normal and breath sounds normal.  Abdominal: Soft. Bowel sounds are normal. She exhibits no mass. There is no tenderness. There is no rebound and no guarding.  Musculoskeletal: Normal range of motion.  Lymphadenopathy:    She has no cervical adenopathy.  Neurological: She is alert and oriented to person, place, and time.  Skin: Skin is warm and dry. No rash noted.  Psychiatric: She has a normal mood and affect. Her behavior is normal.          Assessment & Plan:   Moderate anemia Hypertension.  Blood pressure and a low normal range.  Will hold verapamil at this time Diabetes  Recheck lab We'll schedule an abdominal and pelvic CT scan

## 2014-06-05 NOTE — Patient Instructions (Signed)
Abdominal/pelvic CT scan as discussed  Return in one month for follow-up

## 2014-06-05 NOTE — Progress Notes (Signed)
Pre visit review using our clinic review tool, if applicable. No additional management support is needed unless otherwise documented below in the visit note. 

## 2014-06-06 ENCOUNTER — Ambulatory Visit (INDEPENDENT_AMBULATORY_CARE_PROVIDER_SITE_OTHER)
Admission: RE | Admit: 2014-06-06 | Discharge: 2014-06-06 | Disposition: A | Discharge status: Home / Self Care | Payer: Medicare Other | Source: Ambulatory Visit | Attending: Internal Medicine | Admitting: Internal Medicine

## 2014-06-06 DIAGNOSIS — R630 Anorexia: Secondary | ICD-10-CM | POA: Diagnosis not present

## 2014-06-06 DIAGNOSIS — D649 Anemia, unspecified: Secondary | ICD-10-CM | POA: Diagnosis not present

## 2014-06-06 DIAGNOSIS — D500 Iron deficiency anemia secondary to blood loss (chronic): Secondary | ICD-10-CM | POA: Diagnosis not present

## 2014-06-06 DIAGNOSIS — K5730 Diverticulosis of large intestine without perforation or abscess without bleeding: Secondary | ICD-10-CM | POA: Diagnosis not present

## 2014-06-06 DIAGNOSIS — R634 Abnormal weight loss: Secondary | ICD-10-CM

## 2014-06-06 DIAGNOSIS — N281 Cyst of kidney, acquired: Secondary | ICD-10-CM | POA: Diagnosis not present

## 2014-06-06 MED ORDER — IOHEXOL 300 MG/ML  SOLN
100.0000 mL | Freq: Once | INTRAMUSCULAR | Status: AC | PRN
  Administered 2014-06-06: 100 mL via INTRAVENOUS

## 2014-06-08 ENCOUNTER — Other Ambulatory Visit: Payer: Self-pay | Admitting: Internal Medicine

## 2014-06-08 ENCOUNTER — Encounter: Payer: Self-pay | Admitting: Physician Assistant

## 2014-06-08 DIAGNOSIS — R634 Abnormal weight loss: Secondary | ICD-10-CM

## 2014-06-08 DIAGNOSIS — D500 Iron deficiency anemia secondary to blood loss (chronic): Secondary | ICD-10-CM

## 2014-06-12 ENCOUNTER — Other Ambulatory Visit: Payer: Self-pay | Admitting: Internal Medicine

## 2014-06-12 ENCOUNTER — Ambulatory Visit: Payer: Medicare Other

## 2014-06-19 ENCOUNTER — Encounter: Payer: Self-pay | Admitting: Physician Assistant

## 2014-06-19 ENCOUNTER — Ambulatory Visit (INDEPENDENT_AMBULATORY_CARE_PROVIDER_SITE_OTHER): Payer: Medicare Other | Admitting: Physician Assistant

## 2014-06-19 VITALS — BP 124/66 | HR 76 | Ht 60.0 in | Wt 128.0 lb

## 2014-06-19 DIAGNOSIS — R935 Abnormal findings on diagnostic imaging of other abdominal regions, including retroperitoneum: Secondary | ICD-10-CM

## 2014-06-19 DIAGNOSIS — R634 Abnormal weight loss: Secondary | ICD-10-CM | POA: Diagnosis not present

## 2014-06-19 MED ORDER — PANTOPRAZOLE SODIUM 40 MG PO TBEC: DELAYED_RELEASE_TABLET | ORAL | Status: DC

## 2014-06-19 NOTE — Progress Notes (Signed)
Case reviewed in detail with physician assistant. Agree with initial assessment and plans as outlined 

## 2014-06-19 NOTE — Patient Instructions (Addendum)
Stop BC'S. Use Tylenol. Take Protonix 40 mg by mouth every morning. You have been scheduled for an endoscopy. Please follow written instructions given to you at your visit today. If you use inhalers (even only as needed), please bring them with you on the day of your procedure.

## 2014-06-19 NOTE — Progress Notes (Signed)
Patient ID: Christie [name], female   DOB: 1921-03-20, 80 y.o.   MRN: [id number]   Subjective:    Patient ID: Christie [name], female    DOB: 1921-03-20, 80 y.o.   MRN: [id number]  HPI Christie [name] is a very nice 80 year old African-American female new to GI today referred by Dr. Amador CunasKwiatkowski for weight loss and decrease in appetite. She also had a recent CT scan of the abdomen and pelvis done with finding of an abnormal stomach. This showed an indistinct antral wall of the stomach along the lesser curve with surrounding haziness within the adjacent fat between the liver and the distal stomach rule out inflammatory versus neoplasm, also had sigmoid diverticulosis otherwise negative study. Recent labs done 06/05/2014 showed hemoglobin of 9.9 hematocrit of 31 MCV of 77 platelets 157 hemoglobin about 7 months ago was 12.7. Recent LFTs are normal albumin low at 2.6 Patient denies any problems with abdominal pain or nausea, no heartburn or indigestion. No changes in her bowel habits melena or hematochezia. Patient's daughter states that she has been eating much less than usual over the past 6 or 7 months but other that has had no complaints. Her weight is down 5 or 6  pounds during that time .She apparently does use BC powders on a daily basis at least one or 2. Has not had any prior GI issues.  Review of Systems Pertinent positive and negative review of systems were noted in the above HPI section.  All other review of systems was otherwise negative.  Outpatient Encounter Prescriptions as of 06/19/2014  Medication Sig  . Butalbital-APAP-Caffeine 50-300-40 MG CAPS Take 1 tablet by mouth daily.  . diazepam (VALIUM) 2 MG tablet TAKE 1 TABLET EVERY 6 HOURS AS NEEDED FOR ANXIETY AND 1 TABLET AT BEDIME AS DIRECTED.  Marland Kitchen. GLIPIZIDE XL 5 MG 24 hr tablet TAKE 1 TABLET ONCE DAILY.  Marland Kitchen. glucose blood test strip 1 each by Other route daily.  Marland Kitchen. LANCETS ULTRA FINE MISC 1 each by Does not apply route daily.  . metFORMIN  (GLUCOPHAGE-XR) 500 MG 24 hr tablet TAKE 2 TABLETS DAILY AS DIRECTED.  . [DISCONTINUED] verapamil (CALAN-SR) 180 MG CR tablet TAKE ONE TABLET AT BEDTIME.  . pantoprazole (PROTONIX) 40 MG tablet Take 1 capsule every morning.   Allergies  Allergen Reactions  . Codeine    Patient Active Problem List   Diagnosis Date Noted  . HYPOKALEMIA, HX OF 02/09/2008  . Type 2 diabetes mellitus without complication 01/31/2007  . HYPERLIPIDEMIA 01/31/2007  . ANXIETY 01/31/2007  . Essential hypertension 01/31/2007  . Osteoarthritis 01/31/2007  . HEADACHE 01/31/2007   History   Social History  . Marital Status: Widowed    Spouse Name: N/A    Number of Children: 4  . Years of Education: N/A   Occupational History  . Retired    Social History Main Topics  . Smoking status: Former Smoker    Types: Cigarettes  . Smokeless tobacco: Never Used  . Alcohol Use: No  . Drug Use: No  . Sexual Activity: Not on file   Other Topics Concern  . Not on file   Social History Narrative    Ms. Jarrells's family history includes Breast cancer in her sister; Lung cancer in her sister; Prostate cancer in her brother. There is no history of Heart disease, Esophageal cancer, Colon cancer, or Colon polyps.      Objective:    Filed Vitals:   06/19/14 0934  BP: 124/66  Pulse: 76  Physical Exam  well-developed elderly African-American female in no acute distress, she is in a wheelchair accompanied by her daughter height 5 foot weight 128. HEENT; nontraumatic normocephalic EOMI PERRLA sclera anicteric, Supple;no JVD, Cardiovascular; regular rate and rhythm with S1-S2 soft systolic murmur, Pulmonary; clear bilaterally, Abdomen ;soft nontender nondistended bowel sounds are active there is no palpable mass or hepatosplenomegaly, Rectal; exam not done patient unable to get on the exam table extremities no clubbing cyanosis or edema, Psych; mood and affect appropriate, patient very pleasant but offers little in  the way of actual history       Assessment & Plan:   #541 80 year old African-American female with anorexia over the past 6-7 months and associated 6 pound weight loss. Recent CT scan shows an abnormal stomach along the lesser curve with surrounding haziness within the adjacent fat between the liver and the distal stomach raising concern for underlying malignancy versus inflammatory change i.e. peptic ulcer disease. #2 osteoarthritis #3 adult-onset diabetes mellitus #4 hypertension #5 hyperlipidemia #6 debilitation patient minimally ambulatory with a walker usually in a wheelchair  Plan; Patient encouraged to stop BC powders and use Tylenol for arthritis symptoms Start Protonix 40 mg by mouth every morning Check iron studies [facility] due to nonambulatory status. Procedure discussed in detail with patient and her daughter and they are agreeable to proceed. Further plans pending findings at EGD  Sammuel Coopermy S Shawnya Mayor PA-C 06/19/2014

## 2014-06-25 ENCOUNTER — Telehealth: Payer: Self-pay | Admitting: Internal Medicine

## 2014-06-25 NOTE — Telephone Encounter (Signed)
Spoke to Christie [name], she said pt's blood sugar on Friday night was 50 and she gave her juice and food. Pt's sugars have been ranging 75-123 . Has not given pt Glipizide since Friday only taking Metformin. Told [name] will let Dr. Kirtland BouchardK know and get back to her tomorrow. [name] verbalized understanding.

## 2014-06-25 NOTE — Telephone Encounter (Signed)
Daughter asked if you would give her a cb concerning pt's Blood sugar and her meds.  Refused to elaborate

## 2014-06-26 NOTE — Telephone Encounter (Signed)
Dr. Kirtland BouchardK, please see message about pt sugars and advise.

## 2014-06-26 NOTE — Telephone Encounter (Signed)
Discontinue glipizide

## 2014-06-26 NOTE — Telephone Encounter (Signed)
Spoke to pt's daughter [name], told her Dr. Kirtland BouchardK said to stop Glipizide all together and monitor sugars. [name] verbalized understanding.

## 2014-07-02 ENCOUNTER — Encounter (HOSPITAL_COMMUNITY)
Admission: RE | Disposition: A | Discharge status: Home / Self Care | Payer: Self-pay | Source: Ambulatory Visit | Attending: Internal Medicine

## 2014-07-02 ENCOUNTER — Encounter (HOSPITAL_COMMUNITY): Payer: Self-pay | Admitting: *Deleted

## 2014-07-02 ENCOUNTER — Ambulatory Visit (HOSPITAL_COMMUNITY)
Admission: RE | Admit: 2014-07-02 | Discharge: 2014-07-02 | Disposition: A | Discharge status: Home / Self Care | Payer: Medicare Other | Source: Ambulatory Visit | Attending: Internal Medicine | Admitting: Internal Medicine

## 2014-07-02 DIAGNOSIS — R634 Abnormal weight loss: Secondary | ICD-10-CM | POA: Diagnosis not present

## 2014-07-02 DIAGNOSIS — R935 Abnormal findings on diagnostic imaging of other abdominal regions, including retroperitoneum: Secondary | ICD-10-CM

## 2014-07-02 DIAGNOSIS — K259 Gastric ulcer, unspecified as acute or chronic, without hemorrhage or perforation: Secondary | ICD-10-CM | POA: Diagnosis not present

## 2014-07-02 DIAGNOSIS — R1013 Epigastric pain: Secondary | ICD-10-CM | POA: Diagnosis not present

## 2014-07-02 DIAGNOSIS — K254 Chronic or unspecified gastric ulcer with hemorrhage: Secondary | ICD-10-CM | POA: Diagnosis not present

## 2014-07-02 DIAGNOSIS — R938 Abnormal findings on diagnostic imaging of other specified body structures: Secondary | ICD-10-CM | POA: Diagnosis present

## 2014-07-02 HISTORY — PX: ESOPHAGOGASTRODUODENOSCOPY: SHX5428

## 2014-07-02 LAB — GLUCOSE, CAPILLARY: Glucose-Capillary: 137 mg/dL — ABNORMAL HIGH (ref 70–99)

## 2014-07-02 SURGERY — EGD (ESOPHAGOGASTRODUODENOSCOPY)
Anesthesia: Moderate Sedation

## 2014-07-02 MED ORDER — MIDAZOLAM HCL 10 MG/2ML IJ SOLN
INTRAMUSCULAR | Status: DC | PRN
  Administered 2014-07-02: 2 mg via INTRAVENOUS
  Administered 2014-07-02 (×2): 1 mg via INTRAVENOUS

## 2014-07-02 MED ORDER — DIPHENHYDRAMINE HCL 50 MG/ML IJ SOLN
INTRAMUSCULAR | Status: AC
  Filled 2014-07-02: qty 1

## 2014-07-02 MED ORDER — FENTANYL CITRATE 0.05 MG/ML IJ SOLN
INTRAMUSCULAR | Status: DC | PRN
  Administered 2014-07-02 (×2): 25 ug via INTRAVENOUS

## 2014-07-02 MED ORDER — MIDAZOLAM HCL 10 MG/2ML IJ SOLN
INTRAMUSCULAR | Status: AC
  Filled 2014-07-02: qty 2

## 2014-07-02 MED ORDER — FENTANYL CITRATE 0.05 MG/ML IJ SOLN
INTRAMUSCULAR | Status: AC
  Filled 2014-07-02: qty 2

## 2014-07-02 MED ORDER — SODIUM CHLORIDE 0.9 % IV SOLN: INTRAVENOUS | Status: DC

## 2014-07-02 MED ORDER — BUTAMBEN-TETRACAINE-BENZOCAINE 2-2-14 % EX AERO
INHALATION_SPRAY | CUTANEOUS | Status: DC | PRN
  Administered 2014-07-02: 2 via TOPICAL

## 2014-07-02 NOTE — Interval H&P Note (Signed)
History and Physical Interval Note:  07/02/2014 8:39 AM  Christie [name]  has presented today for surgery, with the diagnosis of Abnormal CT Weight loss. No interval change since recent office evaluation. Diagnostic upper endoscopy planned for today. Anorexia  The various methods of treatment have been discussed with the patient and family. After consideration of risks, benefits and other options for treatment, the patient has consented to  Procedure(s): ESOPHAGOGASTRODUODENOSCOPY (EGD) (N/A) as a surgical intervention .  The patient's history has been reviewed, patient examined, no change in status, stable for surgery.  I have reviewed the patient's chart and labs.  Questions were answered to the patient's satisfaction.     Christie FlemingsJohn Kiaan [name]

## 2014-07-02 NOTE — Discharge Instructions (Addendum)
Gastrointestinal Endoscopy, Care After  Refer to this sheet in the next few weeks. These instructions provide you with information on caring for yourself after your procedure. Your caregiver may also give you more specific instructions. Your treatment has been planned according to current medical practices, but problems sometimes occur. Call your caregiver if you have any problems or questions after your procedure. HOME CARE INSTRUCTIONS  If you were given medicine to help you relax (sedative), do not drive, operate machinery, or sign important documents for 24 hours.  Avoid alcohol and hot or warm beverages for the first 24 hours after the procedure.  Only take over-the-counter or prescription medicines for pain, discomfort, or fever as directed by your caregiver. You may resume taking your normal medicines unless your caregiver tells you otherwise. Ask your caregiver when you may resume taking medicines that may cause bleeding, such as aspirin, clopidogrel, or warfarin.  You may return to your normal diet and activities on the day after your procedure, or as directed by your caregiver. Walking may help to reduce any bloated feeling in your abdomen.  Drink enough fluids to keep your urine clear or pale yellow.  You may gargle with salt water if you have a sore throat. SEEK IMMEDIATE MEDICAL CARE IF:  You have severe nausea or vomiting.  You have severe abdominal pain, abdominal cramps that last longer than 6 hours, or abdominal swelling (distention).  You have severe shoulder or back pain.  You have trouble swallowing.  You have shortness of breath, your breathing is shallow, or you are breathing faster than normal.  You have a fever or a rapid heartbeat.  You vomit blood or material that looks like coffee grounds.  You have bloody, black, or tarry stools. MAKE SURE YOU:  Understand these instructions.  Will watch your condition.  Will get help right away if you are not doing  well or get worse. Document Released: 02/25/2004 Document Revised: 11/27/2013 Document Reviewed: 10/13/2011 [facility] Patient Information 2015 [employer] [place]. This information is not intended to replace advice given to you by your health care provider. Make sure you discuss any questions you have with your health care provider.  Recommendations Continue Pantoprazole daily. Avoid NSAID'S- no headache powders, advil, motrin, ibuprofen type medications. Tylenol okay for pain. Call for office appointment in 4-6 weeks.

## 2014-07-02 NOTE — H&P (View-Only) (Signed)
Case reviewed in detail with physician assistant. Agree with initial assessment and plans as outlined

## 2014-07-03 ENCOUNTER — Ambulatory Visit: Payer: Medicare Other

## 2014-07-03 ENCOUNTER — Encounter (HOSPITAL_COMMUNITY): Payer: Self-pay | Admitting: Internal Medicine

## 2014-07-04 NOTE — Op Note (Signed)
[facility] [address] [place], [id number]   ENDOSCOPY PROCEDURE REPORT  PATIENT: Christie [name], Christie  MR#: [id number] BIRTHDATE: [birth year] , 89  yrs. old GENDER: female ENDOSCOPIST: Roxy CedarJohn N Perry Jr, MD REFERRED BY:  Eleonore ChiquitoPeter Kwiatkowski, M.D. PROCEDURE DATE:  07/02/2014 PROCEDURE:  EGD w/ biopsy ASA CLASS:     Class II INDICATIONS:  dyspepsia, weight loss, and abnormal CT of the GI tract.  Feeling better since D/C NSAIDS and starting PPI MEDICATIONS: Fentanyl 50 mcg IV and Versed 4 mg IV TOPICAL ANESTHETIC: Cetacaine Spray  DESCRIPTION OF PROCEDURE: After the risks benefits and alternatives of the procedure were thoroughly explained, informed consent was obtained.  The Pentax Gastroscope [id number] endoscope was introduced through the mouth and advanced to the second portion of the duodenum , Without limitations.  The instrument was slowly withdrawn as the mucosa was fully examined.    EXAM:  Normal esophagus.  12mm healing prepyloric ulcer with encroachment on pyloric opening.  Otherwise normal stomach.  Normal duodenum.  Ulcer biopsied.  Retroflexed views revealed no abnormalities.     The scope was then withdrawn from the patient and the procedure completed.  COMPLICATIONS: There were no immediate complications.  ENDOSCOPIC IMPRESSION: 1. Benign appearring gastric ulcer (likely secondary to NSAIDS) s/p biopsies 2. Otherwise normal EGD  RECOMMENDATIONS: 1.  Continue Pantoprazole DAILY 2.  Avoid NSAIDS - NO HEADACHE POWDERS, ADVIL, MOTRIN,IBUPROFEN TYPE MEDICATIONS 3. TYLENOL OK FOR PAIN 4.  Await biopsy results 5.  Call for office visit to be seen in 4-6 weeks by Dr. Marina GoodellPerry - [phone]  REPEAT EXAM:  eSigned:  Roxy CedarJohn N Perry Jr, MD 07/02/2014 9:26 AM    NF:AOZHYCC:Peter Lysle DingwallF Kwiatkowski, MD and The Patient

## 2014-07-23 ENCOUNTER — Encounter: Payer: Self-pay | Admitting: Internal Medicine

## 2014-07-23 ENCOUNTER — Ambulatory Visit (INDEPENDENT_AMBULATORY_CARE_PROVIDER_SITE_OTHER): Payer: Medicare Other | Admitting: Internal Medicine

## 2014-07-23 VITALS — BP 140/70 | HR 83 | Temp 97.9°F | Resp 18

## 2014-07-23 DIAGNOSIS — I10 Essential (primary) hypertension: Secondary | ICD-10-CM | POA: Diagnosis not present

## 2014-07-23 DIAGNOSIS — K257 Chronic gastric ulcer without hemorrhage or perforation: Secondary | ICD-10-CM | POA: Diagnosis not present

## 2014-07-23 DIAGNOSIS — E119 Type 2 diabetes mellitus without complications: Secondary | ICD-10-CM | POA: Diagnosis not present

## 2014-07-23 NOTE — Patient Instructions (Signed)
Take an iron tablet daily  Return in 2 months for follow-up  Avoid all BC powder aspirin, ibuprofen, and all anti-inflammatories

## 2014-07-23 NOTE — Progress Notes (Signed)
Pre visit review using our clinic review tool, if applicable. No additional management support is needed unless otherwise documented below in the visit note. 

## 2014-07-23 NOTE — Progress Notes (Signed)
Subjective:    Patient ID: Christie [name], female    DOB: 12/23/1920, 80 y.o.   MRN: [id number]  HPI  Wt Readings from Last 3 Encounters:  07/02/14 124 lb (56.246 kg)  06/19/14 128 lb (58.06 kg)  06/05/14 128 lb (58.1006 kg)   80 year old patient who is seen today for follow-up.  She presented recently with weight loss, anorexia and abnormal abdominal CT scan.  She is status post upper endoscopy that revealed a benign gastric ulcer.  This is felt secondary to anti-inflammatory medication.  She has done quite well and her hepatitis has normalized.  No further weight loss.  She generally feels well today.  Past Medical History  Diagnosis Date  . ANXIETY 01/31/2007  . DIABETES MELLITUS, TYPE II 01/31/2007  . Headache(784.0) 01/31/2007  . HYPERLIPIDEMIA 01/31/2007  . HYPERTENSION 01/31/2007  . HYPOKALEMIA, HX OF 02/09/2008  . OSTEOARTHRITIS 01/31/2007    History   Social History  . Marital Status: Widowed    Spouse Name: N/A    Number of Children: 4  . Years of Education: N/A   Occupational History  . Retired    Social History Main Topics  . Smoking status: Former Smoker    Types: Cigarettes  . Smokeless tobacco: Never Used  . Alcohol Use: No  . Drug Use: No  . Sexual Activity: Not on file   Other Topics Concern  . Not on file   Social History Narrative    Past Surgical History  Procedure Laterality Date  . Abdominal hysterectomy    . Cataract extraction    . Esophagogastroduodenoscopy N/A 07/02/2014    Procedure: ESOPHAGOGASTRODUODENOSCOPY (EGD);  Surgeon: Hilarie FredricksonJohn N Perry, MD;  [facility];  Service: Endoscopy;  Laterality: N/A;    Family History  Problem Relation Age of Onset  . Breast cancer Sister   . Heart disease Neg Hx   . Esophageal cancer Neg Hx   . Lung cancer Sister   . Prostate cancer Brother   . Colon cancer Neg Hx   . Colon polyps Neg Hx     Allergies  Allergen Reactions  . Codeine     Current Outpatient Prescriptions on File Prior to Visit   Medication Sig Dispense Refill  . Butalbital-APAP-Caffeine 50-300-40 MG CAPS Take 1 tablet by mouth daily. 30 capsule 5  . diazepam (VALIUM) 2 MG tablet TAKE 1 TABLET EVERY 6 HOURS AS NEEDED FOR ANXIETY AND 1 TABLET AT BEDIME AS DIRECTED. 30 tablet 5  . glucose blood test strip 1 each by Other route daily. 50 each 12  . LANCETS ULTRA FINE MISC 1 each by Does not apply route daily. 100 each 3  . metFORMIN (GLUCOPHAGE-XR) 500 MG 24 hr tablet TAKE 2 TABLETS DAILY AS DIRECTED. 180 tablet 1  . pantoprazole (PROTONIX) 40 MG tablet Take 1 capsule every morning. 30 tablet 4  . GLIPIZIDE XL 5 MG 24 hr tablet TAKE 1 TABLET ONCE DAILY. (Patient not taking: Reported on 07/23/2014) 90 tablet 1   No current facility-administered medications on file prior to visit.    BP 140/70 mmHg  Pulse 83  Temp(Src) 97.9 F (36.6 C) (Oral)  Resp 18  Wt   SpO2 98%      Review of Systems  Constitutional: Negative.   HENT: Negative for congestion, dental problem, hearing loss, rhinorrhea, sinus pressure, sore throat and tinnitus.   Eyes: Negative for pain, discharge and visual disturbance.  Respiratory: Negative for cough and shortness of breath.   Cardiovascular: Negative  for chest pain, palpitations and leg swelling.  Gastrointestinal: Negative for nausea, vomiting, abdominal pain, diarrhea, constipation, blood in stool and abdominal distention.  Genitourinary: Negative for dysuria, urgency, frequency, hematuria, flank pain, vaginal bleeding, vaginal discharge, difficulty urinating, vaginal pain and pelvic pain.  Musculoskeletal: Positive for back pain, arthralgias and gait problem. Negative for joint swelling.  Skin: Negative for rash.  Neurological: Negative for dizziness, syncope, speech difficulty, weakness, numbness and headaches.  Hematological: Negative for adenopathy.  Psychiatric/Behavioral: Negative for behavioral problems, dysphoric mood and agitation. The patient is not nervous/anxious.         Objective:   Physical Exam  Constitutional: She is oriented to person, place, and time. She appears well-developed and well-nourished.  HENT:  Head: Normocephalic.  Right Ear: External ear normal.  Left Ear: External ear normal.  Mouth/Throat: Oropharynx is clear and moist.  Eyes: Conjunctivae and EOM are normal. Pupils are equal, round, and reactive to light.  Neck: Normal range of motion. Neck supple. No thyromegaly present.  Cardiovascular: Normal rate, regular rhythm, normal heart sounds and intact distal pulses.   Pulmonary/Chest: Effort normal and breath sounds normal.  Abdominal: Soft. Bowel sounds are normal. She exhibits no distension and no mass. There is no tenderness.  Musculoskeletal: Normal range of motion.  Lymphadenopathy:    She has no cervical adenopathy.  Neurological: She is alert and oriented to person, place, and time.  Skin: Skin is warm and dry. No rash noted.  Psychiatric: She has a normal mood and affect. Her behavior is normal.          Assessment & Plan:   Benign gastric ulcer.  We'll continue to hold anti-inflammatory medications indefinitely.  Patient counseled Hypertension.  Blood pressure normal.  Off medication Diabetes mellitus.  Blood sugars remain well controlled off glipizide.  We'll continue metformin only  Patient has been asked to take an iron tablet daily Recheck 2 months with CBC and hemoglobin A1c

## 2014-08-01 ENCOUNTER — Ambulatory Visit: Payer: Medicare Other | Admitting: Internal Medicine

## 2014-08-14 ENCOUNTER — Ambulatory Visit: Payer: Medicare Other

## 2014-08-20 ENCOUNTER — Ambulatory Visit: Payer: Medicare Other | Admitting: Internal Medicine

## 2014-08-22 ENCOUNTER — Ambulatory Visit: Payer: Medicare Other | Admitting: Internal Medicine

## 2014-09-14 ENCOUNTER — Encounter: Payer: Self-pay | Admitting: Internal Medicine

## 2014-09-14 ENCOUNTER — Ambulatory Visit (INDEPENDENT_AMBULATORY_CARE_PROVIDER_SITE_OTHER): Payer: Medicare Other | Admitting: Internal Medicine

## 2014-09-14 VITALS — BP 132/74 | HR 72 | Wt 128.2 lb

## 2014-09-14 DIAGNOSIS — R935 Abnormal findings on diagnostic imaging of other abdominal regions, including retroperitoneum: Secondary | ICD-10-CM

## 2014-09-14 DIAGNOSIS — K257 Chronic gastric ulcer without hemorrhage or perforation: Secondary | ICD-10-CM

## 2014-09-14 DIAGNOSIS — R634 Abnormal weight loss: Secondary | ICD-10-CM

## 2014-09-14 MED ORDER — PANTOPRAZOLE SODIUM 40 MG PO TBEC: DELAYED_RELEASE_TABLET | ORAL | Status: DC

## 2014-09-14 NOTE — Patient Instructions (Signed)
We have sent the following medications to your pharmacy for you to pick up at your convenience:  Pantoprazole  

## 2014-09-14 NOTE — Progress Notes (Signed)
HISTORY OF PRESENT ILLNESS:  Christie [name] is a 81 y.o. female who was evaluated in the office 06/19/2014 for anorexia, weight loss, and abnormal CT scan. She subsequently underwent upper endoscopy and was found to have a benign-appearing 12 mm prepyloric ulcer with encroachment on the pyloric opening. Testing for Helicobacter pylori was negative. She was treated with pantoprazole 40 mg daily and told to avoid unnecessary NSAIDs. She presents today for follow-up. She is accompanied by a family member. The patient is delighted to report that she has had no abdominal pain. Her appetite is good. She has not lost any further weight.  REVIEW OF SYSTEMS:  All non-GI ROS negative except for arthritis  Past Medical History  Diagnosis Date  . ANXIETY 01/31/2007  . DIABETES MELLITUS, TYPE II 01/31/2007  . Headache(784.0) 01/31/2007  . HYPERLIPIDEMIA 01/31/2007  . HYPERTENSION 01/31/2007  . HYPOKALEMIA, HX OF 02/09/2008  . OSTEOARTHRITIS 01/31/2007  . Gastric ulcer     Past Surgical History  Procedure Laterality Date  . Abdominal hysterectomy    . Cataract extraction Left   . Esophagogastroduodenoscopy N/A 07/02/2014    Procedure: ESOPHAGOGASTRODUODENOSCOPY (EGD);  Surgeon: Hilarie FredricksonJohn N Elke Holtry, MD;  Location: [facility];  Service: Endoscopy;  Laterality: N/A;    Social History Christie [name]  reports that she has quit smoking. Her smoking use included Cigarettes. She has never used smokeless tobacco. She reports that she does not drink alcohol or use illicit drugs.  family history includes Breast cancer in her sister; Lung cancer in her sister; Prostate cancer in her brother. There is no history of Heart disease, Esophageal cancer, Colon cancer, Colon polyps, or Stomach cancer.  Allergies  Allergen Reactions  . Codeine        PHYSICAL EXAMINATION: Vital signs: BP 132/74 mmHg  Pulse 72  Wt 128 lb 3.2 oz (58.151 kg). No change in 3 months General: Well-developed, well-nourished, no acute  distress HEENT: Sclerae are anicteric, conjunctiva pink. Oral mucosa intact Lungs: Clear Heart: Regular Abdomen: soft, nontender, nondistended, no obvious ascites, no peritoneal signs, normal bowel sounds. No organomegaly. Extremities: No edema Psychiatric: alert and oriented x3. Cooperative     ASSESSMENT:  #1. Gastric ulcer. Clinically improved on medical therapy #2. Weight loss. Resolved   PLAN:  #1. Continue pantoprazole 40 mg daily indefinitely to reduce the risk of ulcer recurrence #2. Avoid unnecessary NSAIDs #3. Return to the care of Dr. Amador CunasKwiatkowski #4. GI follow-up as needed

## 2014-09-21 ENCOUNTER — Encounter: Payer: Self-pay | Admitting: Internal Medicine

## 2014-09-21 ENCOUNTER — Ambulatory Visit (INDEPENDENT_AMBULATORY_CARE_PROVIDER_SITE_OTHER): Payer: Medicare Other | Admitting: Internal Medicine

## 2014-09-21 VITALS — BP 150/80 | HR 72 | Temp 97.7°F | Resp 18 | Ht 60.0 in | Wt 131.0 lb

## 2014-09-21 DIAGNOSIS — M159 Polyosteoarthritis, unspecified: Secondary | ICD-10-CM

## 2014-09-21 DIAGNOSIS — E119 Type 2 diabetes mellitus without complications: Secondary | ICD-10-CM

## 2014-09-21 DIAGNOSIS — D500 Iron deficiency anemia secondary to blood loss (chronic): Secondary | ICD-10-CM | POA: Diagnosis not present

## 2014-09-21 DIAGNOSIS — I10 Essential (primary) hypertension: Secondary | ICD-10-CM | POA: Diagnosis not present

## 2014-09-21 DIAGNOSIS — K257 Chronic gastric ulcer without hemorrhage or perforation: Secondary | ICD-10-CM | POA: Diagnosis not present

## 2014-09-21 DIAGNOSIS — M150 Primary generalized (osteo)arthritis: Secondary | ICD-10-CM

## 2014-09-21 LAB — CBC WITH DIFFERENTIAL/PLATELET
BASOS ABS: 0 10*3/uL (ref 0.0–0.1)
Basophils Relative: 0.6 % (ref 0.0–3.0)
EOS PCT: 1.6 % (ref 0.0–5.0)
Eosinophils Absolute: 0.1 10*3/uL (ref 0.0–0.7)
HCT: 35.4 % — ABNORMAL LOW (ref 36.0–46.0)
Hemoglobin: 11.4 g/dL — ABNORMAL LOW (ref 12.0–15.0)
LYMPHS ABS: 0.9 10*3/uL (ref 0.7–4.0)
LYMPHS PCT: 23.2 % (ref 12.0–46.0)
MCHC: 32.3 g/dL (ref 30.0–36.0)
MCV: 73.3 fl — ABNORMAL LOW (ref 78.0–100.0)
Monocytes Absolute: 0.6 10*3/uL (ref 0.1–1.0)
Monocytes Relative: 15.4 % — ABNORMAL HIGH (ref 3.0–12.0)
NEUTROS PCT: 59.2 % (ref 43.0–77.0)
Neutro Abs: 2.2 10*3/uL (ref 1.4–7.7)
PLATELETS: 151 10*3/uL (ref 150.0–400.0)
RBC: 4.84 Mil/uL (ref 3.87–5.11)
RDW: 25.8 % — ABNORMAL HIGH (ref 11.5–15.5)
WBC: 3.8 10*3/uL — AB (ref 4.0–10.5)

## 2014-09-21 LAB — HEMOGLOBIN A1C: Hgb A1c MFr Bld: 6.4 % (ref 4.6–6.5)

## 2014-09-21 NOTE — Patient Instructions (Signed)
Please check your hemoglobin A1c every 3 months  Limit your sodium (Salt) intake   

## 2014-09-21 NOTE — Progress Notes (Signed)
Subjective:    Patient ID: Christie ArmsRoxie [name], female    DOB: Mar 04, 1921, 81 y.o.   MRN: [id number]  HPI 81 year old patient who is seen today for her quarterly follow-up.  She has type 2 diabetes that has been managed with metformin therapy.  Hemoglobin A1c's have been well controlled  Lab Results  Component Value Date   HGBA1C 6.6* 05/18/2014    She has essential hypertension She has significant osteoarthritis.  She was treated late last year for a gastric ulcer related to anti-inflammatory drug use.  She no longer takes [employer] powder In general doing quite well today.  Denies any abdominal pain.  Her appetite is good and there is been no weight loss  Wt Readings from Last 3 Encounters:  09/21/14 131 lb (59.421 kg)  09/14/14 128 lb 3.2 oz (58.151 kg)  07/02/14 124 lb (56.246 kg)    Past Medical History  Diagnosis Date  . ANXIETY 01/31/2007  . DIABETES MELLITUS, TYPE II 01/31/2007  . Headache(784.0) 01/31/2007  . HYPERLIPIDEMIA 01/31/2007  . HYPERTENSION 01/31/2007  . HYPOKALEMIA, HX OF 02/09/2008  . OSTEOARTHRITIS 01/31/2007  . Gastric ulcer     History   Social History  . Marital Status: Widowed    Spouse Name: N/A  . Number of Children: 4  . Years of Education: N/A   Occupational History  . Retired    Social History Main Topics  . Smoking status: Former Smoker    Types: Cigarettes  . Smokeless tobacco: Never Used  . Alcohol Use: No  . Drug Use: No  . Sexual Activity: Not on file   Other Topics Concern  . Not on file   Social History Narrative    Past Surgical History  Procedure Laterality Date  . Abdominal hysterectomy    . Cataract extraction Left   . Esophagogastroduodenoscopy N/A 07/02/2014    Procedure: ESOPHAGOGASTRODUODENOSCOPY (EGD);  Surgeon: Hilarie FredricksonJohn N Perry, MD;  Location: [facility];  Service: Endoscopy;  Laterality: N/A;    Family History  Problem Relation Age of Onset  . Breast cancer Sister   . Heart disease Neg Hx   . Esophageal cancer Neg Hx     . Lung cancer Sister   . Prostate cancer Brother   . Colon cancer Neg Hx   . Colon polyps Neg Hx   . Stomach cancer Neg Hx     Allergies  Allergen Reactions  . Codeine     Current Outpatient Prescriptions on File Prior to Visit  Medication Sig Dispense Refill  . Butalbital-APAP-Caffeine 50-300-40 MG CAPS Take 1 tablet by mouth daily. 30 capsule 5  . diazepam (VALIUM) 2 MG tablet TAKE 1 TABLET EVERY 6 HOURS AS NEEDED FOR ANXIETY AND 1 TABLET AT BEDIME AS DIRECTED. 30 tablet 5  . glucose blood test strip 1 each by Other route daily. 50 each 12  . LANCETS ULTRA FINE MISC 1 each by Does not apply route daily. 100 each 3  . metFORMIN (GLUCOPHAGE-XR) 500 MG 24 hr tablet TAKE 2 TABLETS DAILY AS DIRECTED. 180 tablet 1  . pantoprazole (PROTONIX) 40 MG tablet Take 1 capsule every morning. 30 tablet 6   No current facility-administered medications on file prior to visit.    BP 150/80 mmHg  Pulse 72  Temp(Src) 97.7 F (36.5 C) (Oral)  Resp 18  Ht 5' (1.524 m)  Wt 131 lb (59.421 kg)  BMI 25.58 kg/m2  SpO2 95%      Review of Systems  Constitutional: Negative.  HENT: Negative for congestion, dental problem, hearing loss, rhinorrhea, sinus pressure, sore throat and tinnitus.   Eyes: Negative for pain, discharge and visual disturbance.  Respiratory: Negative for cough and shortness of breath.   Cardiovascular: Negative for chest pain, palpitations and leg swelling.  Gastrointestinal: Negative for nausea, vomiting, abdominal pain, diarrhea, constipation, blood in stool and abdominal distention.  Genitourinary: Negative for dysuria, urgency, frequency, hematuria, flank pain, vaginal bleeding, vaginal discharge, difficulty urinating, vaginal pain and pelvic pain.  Musculoskeletal: Negative for joint swelling, arthralgias and gait problem.  Skin: Negative for rash.  Neurological: Negative for dizziness, syncope, speech difficulty, weakness, numbness and headaches.  Hematological:  Negative for adenopathy.  Psychiatric/Behavioral: Negative for behavioral problems, dysphoric mood and agitation. The patient is not nervous/anxious.        Objective:   Physical Exam  Constitutional: She is oriented to person, place, and time. She appears well-developed and well-nourished.  Wheelchair bound Repeat blood pressure 134/74  HENT:  Head: Normocephalic.  Right Ear: External ear normal.  Left Ear: External ear normal.  Mouth/Throat: Oropharynx is clear and moist.  Eyes: Conjunctivae and EOM are normal. Pupils are equal, round, and reactive to light.  Neck: Normal range of motion. Neck supple. No thyromegaly present.  Cardiovascular: Normal rate, regular rhythm, normal heart sounds and intact distal pulses.   Pulmonary/Chest: Effort normal and breath sounds normal.  Abdominal: Soft. Bowel sounds are normal. She exhibits no mass. There is no tenderness. There is no rebound and no guarding.  Musculoskeletal: Normal range of motion.  Lymphadenopathy:    She has no cervical adenopathy.  Neurological: She is alert and oriented to person, place, and time.  Skin: Skin is warm and dry. No rash noted.  Psychiatric: She has a normal mood and affect. Her behavior is normal.          Assessment & Plan:    Diabetes mellitus.  Will check a hemoglobin A1c Essential hypertension, stable Osteoarthritis Gastric ulcer.  Continue PPI therapy.  Patient counseled against anti-inflammatory drug use History of anemia.  Will check CBC

## 2014-09-21 NOTE — Progress Notes (Signed)
Pre visit review using our clinic review tool, if applicable. No additional management support is needed unless otherwise documented below in the visit note. 

## 2014-10-09 ENCOUNTER — Ambulatory Visit: Payer: Medicare Other

## 2014-10-23 ENCOUNTER — Ambulatory Visit: Payer: Medicare Other

## 2014-11-05 ENCOUNTER — Telehealth: Payer: Self-pay

## 2014-11-05 MED ORDER — BUTALBITAL-APAP-CAFFEINE 50-300-40 MG PO CAPS: 1.0000 | ORAL_CAPSULE | Freq: Every day | ORAL | Status: DC

## 2014-11-05 MED ORDER — DIAZEPAM 2 MG PO TABS: ORAL_TABLET | ORAL | Status: DC

## 2014-11-05 NOTE — Telephone Encounter (Signed)
Rx's called into pharmacy.

## 2014-11-05 NOTE — Telephone Encounter (Signed)
Also Butalbital-APAP-Caffeine 50-300-40 MG CAPS

## 2014-11-05 NOTE — Telephone Encounter (Signed)
[facility] refill request for diazepam (VALIUM) 2 MG tablet. Note says, "We just need a refill for file to fill on 4/21."

## 2014-11-06 ENCOUNTER — Ambulatory Visit: Payer: Medicare Other | Admitting: Podiatry

## 2014-11-06 NOTE — Progress Notes (Deleted)
Subjective:     Patient ID: Christie ArmsRoxie [name], female   DOB: [birth year], 81 y.o.   MRN: [id number]  HPI Presents today chief complaint of painful elongated toenails.  Objective: Pulses are palpable bilateral nails are thick, yellow dystrophic onychomycosis and painful palpation.   Assessment: Onychomycosis with pain in limb.  Plan: Treatment of nails in thickness and length as covered service secondary to pain.   Review of Systems     Objective:   Physical Exam     Assessment:     ***    Plan:     ***

## 2014-12-05 ENCOUNTER — Telehealth: Payer: Self-pay

## 2014-12-05 NOTE — Telephone Encounter (Signed)
Patient has a history of a large gastric ulcer related to anti-inflammatory medications.  She should not be taking any BC powder.  Meloxicam.  Also would be too risky

## 2014-12-05 NOTE — Telephone Encounter (Signed)
Janie with [facility] called with concerns for pt. 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 Wille CelesteJanie says that pt takes a "ton" of BC powders and they are concerned about that expecially considering the medication that she takes. Wille CelesteJanie says that she called pt and discussed this with her and the pt told her that Dr. Kirtland BouchardK doesn't even know [facility]. Wille CelesteJanie suggested to the pt that the pharmacy could ask the MD about maybe prescribing meloxicam so that pt isn't taking so much asa because they are concerned about her stomach. Wille CelesteJanie says the pt told her that her stomach doesn't hurt.    I advised Wille CelesteJanie that I would forward message to Lupita LeashDonna for Dr. Kirtland BouchardK to review and suggestions

## 2014-12-05 NOTE — Telephone Encounter (Signed)
Dr. K, please see message and advise. 

## 2014-12-06 NOTE — Telephone Encounter (Signed)
Spoke to pt's daughter [name], told her pharmacy called us about pt and her taking a lot of BC powder and Dr. Kirtland BouchardK said she should not be taking due to history of gastric ulcer related to anti-inflammatory medications. [name] said yes, and pt has been told numerous times but continues to take them, and has been told by her and other doctors too that she should not be taking. [name] said there is nothing she can do pt orders them on her own. Told her okay but need to reinforce to pt that she should not be taking. [name] verbalized understanding and will tell pt again.

## 2014-12-06 NOTE — Telephone Encounter (Signed)
Left message on voicemail to call office.  

## 2014-12-20 ENCOUNTER — Ambulatory Visit (INDEPENDENT_AMBULATORY_CARE_PROVIDER_SITE_OTHER): Payer: Medicare Other | Admitting: Internal Medicine

## 2014-12-20 ENCOUNTER — Encounter: Payer: Self-pay | Admitting: Internal Medicine

## 2014-12-20 VITALS — BP 140/82 | HR 73 | Temp 98.0°F | Resp 18

## 2014-12-20 DIAGNOSIS — E119 Type 2 diabetes mellitus without complications: Secondary | ICD-10-CM

## 2014-12-20 DIAGNOSIS — M150 Primary generalized (osteo)arthritis: Secondary | ICD-10-CM

## 2014-12-20 DIAGNOSIS — M159 Polyosteoarthritis, unspecified: Secondary | ICD-10-CM

## 2014-12-20 DIAGNOSIS — I10 Essential (primary) hypertension: Secondary | ICD-10-CM | POA: Diagnosis not present

## 2014-12-20 NOTE — Progress Notes (Signed)
Subjective:    Patient ID: Christie [name], female    DOB: 1921-05-22, 81 y.o.   MRN: [id number]  HPI  81 year old patient who is seen today for her quarterly follow-up.  She has type 2 diabetes which has been very well controlled on metformin therapy.  Last hemoglobin A1c 6.4.  She has a history of gastric ulcer disease and has remained off anti-inflammatory medications.  She has a history of osteoarthritis which has been fairly stable.  She has hypertension which has been controlled without medications.  More recently.  In general doing well.  Last CBC revealed resolution of anemia secondary to gastric ulcer  Past Medical History  Diagnosis Date  . ANXIETY 01/31/2007  . DIABETES MELLITUS, TYPE II 01/31/2007  . Headache(784.0) 01/31/2007  . HYPERLIPIDEMIA 01/31/2007  . HYPERTENSION 01/31/2007  . HYPOKALEMIA, HX OF 02/09/2008  . OSTEOARTHRITIS 01/31/2007  . Gastric ulcer     History   Social History  . Marital Status: Widowed    Spouse Name: N/A  . Number of Children: 4  . Years of Education: N/A   Occupational History  . Retired    Social History Main Topics  . Smoking status: Former Smoker    Types: Cigarettes  . Smokeless tobacco: Never Used  . Alcohol Use: No  . Drug Use: No  . Sexual Activity: Not on file   Other Topics Concern  . Not on file   Social History Narrative    Past Surgical History  Procedure Laterality Date  . Abdominal hysterectomy    . Cataract extraction Left   . Esophagogastroduodenoscopy N/A 07/02/2014    Procedure: ESOPHAGOGASTRODUODENOSCOPY (EGD);  Surgeon: Hilarie FredricksonJohn N Perry, MD;  Location: [facility];  Service: Endoscopy;  Laterality: N/A;    Family History  Problem Relation Age of Onset  . Breast cancer Sister   . Heart disease Neg Hx   . Esophageal cancer Neg Hx   . Lung cancer Sister   . Prostate cancer Brother   . Colon cancer Neg Hx   . Colon polyps Neg Hx   . Stomach cancer Neg Hx     Allergies  Allergen Reactions  . Codeine      Current Outpatient Prescriptions on File Prior to Visit  Medication Sig Dispense Refill  . Butalbital-APAP-Caffeine 50-300-40 MG CAPS Take 1 tablet by mouth daily. 30 capsule 5  . diazepam (VALIUM) 2 MG tablet TAKE 1 TABLET EVERY 6 HOURS AS NEEDED FOR ANXIETY AND 1 TABLET AT BEDIME AS DIRECTED. 30 tablet 5  . glucose blood test strip 1 each by Other route daily. 50 each 12  . LANCETS ULTRA FINE MISC 1 each by Does not apply route daily. 100 each 3  . metFORMIN (GLUCOPHAGE-XR) 500 MG 24 hr tablet TAKE 2 TABLETS DAILY AS DIRECTED. 180 tablet 1  . pantoprazole (PROTONIX) 40 MG tablet Take 1 capsule every morning. 30 tablet 6   No current facility-administered medications on file prior to visit.    BP 140/82 mmHg  Pulse 73  Temp(Src) 98 F (36.7 C) (Oral)  Resp 18  SpO2 98%    Review of Systems  Constitutional: Negative.   HENT: Negative for congestion, dental problem, hearing loss, rhinorrhea, sinus pressure, sore throat and tinnitus.   Eyes: Negative for pain, discharge and visual disturbance.  Respiratory: Negative for cough and shortness of breath.   Cardiovascular: Negative for chest pain, palpitations and leg swelling.  Gastrointestinal: Negative for nausea, vomiting, abdominal pain, diarrhea, constipation, blood in stool and  abdominal distention.  Genitourinary: Negative for dysuria, urgency, frequency, hematuria, flank pain, vaginal bleeding, vaginal discharge, difficulty urinating, vaginal pain and pelvic pain.  Musculoskeletal: Positive for back pain, arthralgias and gait problem. Negative for joint swelling.  Skin: Negative for rash.  Neurological: Negative for dizziness, syncope, speech difficulty, weakness, numbness and headaches.  Hematological: Negative for adenopathy.  Psychiatric/Behavioral: Negative for behavioral problems, dysphoric mood and agitation. The patient is not nervous/anxious.        Objective:   Physical Exam  Constitutional: She is oriented to  person, place, and time. She appears well-developed and well-nourished.  Wheelchair bound Blood pressure 130/70  HENT:  Head: Normocephalic.  Right Ear: External ear normal.  Left Ear: External ear normal.  Mouth/Throat: Oropharynx is clear and moist.  Eyes: Conjunctivae and EOM are normal. Pupils are equal, round, and reactive to light.  Neck: Normal range of motion. Neck supple. No thyromegaly present.  Cardiovascular: Normal rate, regular rhythm, normal heart sounds and intact distal pulses.   Pulmonary/Chest: Effort normal and breath sounds normal.  Abdominal: Soft. Bowel sounds are normal. She exhibits no mass. There is no tenderness.  Musculoskeletal: Normal range of motion.  Lymphadenopathy:    She has no cervical adenopathy.  Neurological: She is alert and oriented to person, place, and time.  Skin: Skin is warm and dry. No rash noted.  Psychiatric: She has a normal mood and affect. Her behavior is normal.          Assessment & Plan:   Diabetes mellitus.  Well-controlled.  We'll recheck hemoglobin A1c in 3 months Hypertension, controlled off medication Osteoarthritis.  Continue Tylenol and when necessary analgesics  Recheck 3 months

## 2014-12-20 NOTE — Patient Instructions (Signed)
Limit your sodium (Salt) intake   Please check your hemoglobin A1c every 3 months  Avoid aspirin, ibuprofen, Aleve, BCs and all anti-inflammatory medications

## 2014-12-20 NOTE — Progress Notes (Signed)
Pre visit review using our clinic review tool, if applicable. No additional management support is needed unless otherwise documented below in the visit note. 

## 2014-12-21 ENCOUNTER — Telehealth: Payer: Self-pay

## 2015-01-21 ENCOUNTER — Other Ambulatory Visit: Payer: Self-pay

## 2015-03-06 ENCOUNTER — Other Ambulatory Visit: Payer: Self-pay | Admitting: Internal Medicine

## 2015-03-21 ENCOUNTER — Ambulatory Visit: Payer: Medicare Other | Admitting: Internal Medicine

## 2015-03-25 ENCOUNTER — Encounter: Payer: Self-pay | Admitting: Internal Medicine

## 2015-03-25 ENCOUNTER — Ambulatory Visit (INDEPENDENT_AMBULATORY_CARE_PROVIDER_SITE_OTHER): Payer: Medicare Other | Admitting: Internal Medicine

## 2015-03-25 VITALS — BP 130/80 | HR 89 | Temp 98.6°F | Resp 18 | Ht 60.0 in | Wt 126.0 lb

## 2015-03-25 DIAGNOSIS — M159 Polyosteoarthritis, unspecified: Secondary | ICD-10-CM

## 2015-03-25 DIAGNOSIS — M150 Primary generalized (osteo)arthritis: Secondary | ICD-10-CM

## 2015-03-25 DIAGNOSIS — E119 Type 2 diabetes mellitus without complications: Secondary | ICD-10-CM

## 2015-03-25 DIAGNOSIS — I10 Essential (primary) hypertension: Secondary | ICD-10-CM

## 2015-03-25 LAB — CBC WITH DIFFERENTIAL/PLATELET
BASOS ABS: 0 10*3/uL (ref 0.0–0.1)
Basophils Relative: 0.5 % (ref 0.0–3.0)
Eosinophils Absolute: 0 10*3/uL (ref 0.0–0.7)
Eosinophils Relative: 1 % (ref 0.0–5.0)
HCT: 35.9 % — ABNORMAL LOW (ref 36.0–46.0)
Hemoglobin: 11.6 g/dL — ABNORMAL LOW (ref 12.0–15.0)
LYMPHS ABS: 1.1 10*3/uL (ref 0.7–4.0)
Lymphocytes Relative: 30.7 % (ref 12.0–46.0)
MCHC: 32.4 g/dL (ref 30.0–36.0)
MCV: 78.9 fl (ref 78.0–100.0)
Monocytes Absolute: 0.5 10*3/uL (ref 0.1–1.0)
Monocytes Relative: 14.4 % — ABNORMAL HIGH (ref 3.0–12.0)
NEUTROS ABS: 1.9 10*3/uL (ref 1.4–7.7)
NEUTROS PCT: 53.4 % (ref 43.0–77.0)
RBC: 4.55 Mil/uL (ref 3.87–5.11)
RDW: 17.6 % — ABNORMAL HIGH (ref 11.5–15.5)
WBC: 3.5 10*3/uL — ABNORMAL LOW (ref 4.0–10.5)

## 2015-03-25 LAB — TSH: TSH: 0.89 u[IU]/mL (ref 0.35–4.50)

## 2015-03-25 LAB — COMPREHENSIVE METABOLIC PANEL
ALBUMIN: 3.7 g/dL (ref 3.5–5.2)
ALK PHOS: 52 U/L (ref 39–117)
ALT: 7 U/L (ref 0–35)
AST: 12 U/L (ref 0–37)
BUN: 16 mg/dL (ref 6–23)
CO2: 28 mEq/L (ref 19–32)
Calcium: 9.5 mg/dL (ref 8.4–10.5)
Chloride: 109 mEq/L (ref 96–112)
Creatinine, Ser: 0.69 mg/dL (ref 0.40–1.20)
GFR: 102.34 mL/min (ref >60.00)
GLUCOSE: 106 mg/dL — AB (ref 70–99)
Potassium: 3.8 mEq/L (ref 3.5–5.1)
SODIUM: 143 meq/L (ref 135–145)
TOTAL PROTEIN: 6.9 g/dL (ref 6.0–8.3)
Total Bilirubin: 0.4 mg/dL (ref 0.2–1.2)

## 2015-03-25 LAB — LIPID PANEL
CHOLESTEROL: 197 mg/dL (ref 0–200)
HDL: 56.9 mg/dL (ref >39.00)
LDL Cholesterol: 117 mg/dL — ABNORMAL HIGH (ref 0–99)
NonHDL: 140.47
Total CHOL/HDL Ratio: 3
Triglycerides: 115 mg/dL (ref 0.0–149.0)
VLDL: 23 mg/dL (ref 0.0–40.0)

## 2015-03-25 LAB — HEMOGLOBIN A1C: Hgb A1c MFr Bld: 6.6 % — ABNORMAL HIGH (ref 4.6–6.5)

## 2015-03-25 NOTE — Patient Instructions (Signed)
Limit your sodium (Salt) intake   Please check your hemoglobin A1c every 3-6 months   

## 2015-03-25 NOTE — Progress Notes (Signed)
Subjective:    Patient ID: Christie [name], female    DOB: 1921/01/16, 81 y.o.   MRN: [id number]  HPI  81 year old patient who is seen today for follow-up.  She has a history of type 2 diabetes, control with metformin therapy only.  This has been quite stable  Lab Results  Component Value Date   HGBA1C 6.4 09/21/2014    She has essential hypertension.  She has significant osteoarthritis. No cardiopulmonary complaints. No recent eye examination.  She does have a remote history of gastric ulcer and anemia.  She has mild anxiety and does take diazepam periodically.  Her medications as dispensed by a daughter.  Past Medical History  Diagnosis Date  . ANXIETY 01/31/2007  . DIABETES MELLITUS, TYPE II 01/31/2007  . Headache(784.0) 01/31/2007  . HYPERLIPIDEMIA 01/31/2007  . HYPERTENSION 01/31/2007  . HYPOKALEMIA, HX OF 02/09/2008  . OSTEOARTHRITIS 01/31/2007  . Gastric ulcer     Social History   Social History  . Marital Status: Widowed    Spouse Name: N/A  . Number of Children: 4  . Years of Education: N/A   Occupational History  . Retired    Social History Main Topics  . Smoking status: Former Smoker    Types: Cigarettes  . Smokeless tobacco: Never Used  . Alcohol Use: No  . Drug Use: No  . Sexual Activity: Not on file   Other Topics Concern  . Not on file   Social History Narrative    Past Surgical History  Procedure Laterality Date  . Abdominal hysterectomy    . Cataract extraction Left   . Esophagogastroduodenoscopy N/A 07/02/2014    Procedure: ESOPHAGOGASTRODUODENOSCOPY (EGD);  Surgeon: Hilarie Fredrickson, MD;  Location: [facility];  Service: Endoscopy;  Laterality: N/A;    Family History  Problem Relation Age of Onset  . Breast cancer Sister   . Heart disease Neg Hx   . Esophageal cancer Neg Hx   . Lung cancer Sister   . Prostate cancer Brother   . Colon cancer Neg Hx   . Colon polyps Neg Hx   . Stomach cancer Neg Hx     Allergies  Allergen Reactions  .  Codeine     Current Outpatient Prescriptions on File Prior to Visit  Medication Sig Dispense Refill  . Butalbital-APAP-Caffeine 50-300-40 MG CAPS Take 1 tablet by mouth daily. 30 capsule 5  . diazepam (VALIUM) 2 MG tablet TAKE 1 TABLET EVERY 6 HOURS AS NEEDED FOR ANXIETY AND 1 TABLET AT BEDIME AS DIRECTED. 30 tablet 5  . glucose blood test strip 1 each by Other route daily. 50 each 12  . LANCETS ULTRA FINE MISC 1 each by Does not apply route daily. 100 each 3  . metFORMIN (GLUCOPHAGE-XR) 500 MG 24 hr tablet TAKE 2 TABLETS DAILY AS DIRECTED. 180 tablet 3  . pantoprazole (PROTONIX) 40 MG tablet Take 1 capsule every morning. 30 tablet 6   No current facility-administered medications on file prior to visit.    BP 130/80 mmHg  Pulse 89  Temp(Src) 98.6 F (37 C) (Oral)  Resp 18  Ht 5' (1.524 m)  Wt 126 lb (57.153 kg)  BMI 24.61 kg/m2  SpO2 98%     Review of Systems  Constitutional: Negative.   HENT: Negative for congestion, dental problem, hearing loss, rhinorrhea, sinus pressure, sore throat and tinnitus.   Eyes: Negative for pain, discharge and visual disturbance.  Respiratory: Negative for cough and shortness of breath.   Cardiovascular: Negative for  chest pain, palpitations and leg swelling.  Gastrointestinal: Negative for nausea, vomiting, abdominal pain, diarrhea, constipation, blood in stool and abdominal distention.  Genitourinary: Negative for dysuria, urgency, frequency, hematuria, flank pain, vaginal bleeding, vaginal discharge, difficulty urinating, vaginal pain and pelvic pain.  Musculoskeletal: Positive for back pain, arthralgias, gait problem, neck pain and neck stiffness. Negative for joint swelling.  Skin: Negative for rash.  Neurological: Negative for dizziness, syncope, speech difficulty, weakness, numbness and headaches.  Hematological: Negative for adenopathy.  Psychiatric/Behavioral: Negative for behavioral problems, dysphoric mood and agitation. The patient is  not nervous/anxious.        Objective:   Physical Exam  Constitutional: She is oriented to person, place, and time. She appears well-developed and well-nourished.  Elderly, alert, wheelchair bound, blood pressure 130/80   HENT:  Head: Normocephalic.  Right Ear: External ear normal.  Left Ear: External ear normal.  Mouth/Throat: Oropharynx is clear and moist.  Eyes: Conjunctivae and EOM are normal. Pupils are equal, round, and reactive to light.  Neck: Normal range of motion. Neck supple. No thyromegaly present.  Cardiovascular: Normal rate, regular rhythm, normal heart sounds and intact distal pulses.   Pulmonary/Chest: Effort normal and breath sounds normal.  Abdominal: Soft. Bowel sounds are normal. She exhibits no mass. There is no tenderness.  Musculoskeletal: Normal range of motion. She exhibits edema.  Plus 2 ankle and pedal edema  Lymphadenopathy:    She has no cervical adenopathy.  Neurological: She is alert and oriented to person, place, and time.  Skin: Skin is warm and dry. No rash noted.  Psychiatric: She has a normal mood and affect. Her behavior is normal.          Assessment & Plan:   Diabetes mellitus.  Will check a hemoglobin A1c, urine for microalbumin and lipid profile.  Annual eye examination recommended Essential hypertension, stable.  This has been stable off medication Anxiety disorder, osteoarthritis History of anemia.  Will check CBC  Recheck 4 months

## 2015-03-25 NOTE — Progress Notes (Signed)
Pre visit review using our clinic review tool, if applicable. No additional management support is needed unless otherwise documented below in the visit note. 

## 2015-04-30 ENCOUNTER — Other Ambulatory Visit: Payer: Self-pay | Admitting: Internal Medicine

## 2015-06-25 ENCOUNTER — Other Ambulatory Visit: Payer: Self-pay | Admitting: Internal Medicine

## 2015-07-17 ENCOUNTER — Encounter: Payer: Self-pay | Admitting: Internal Medicine

## 2015-07-17 ENCOUNTER — Ambulatory Visit (INDEPENDENT_AMBULATORY_CARE_PROVIDER_SITE_OTHER): Payer: Medicare Other | Admitting: Internal Medicine

## 2015-07-17 VITALS — BP 140/82 | HR 71 | Temp 97.4°F | Resp 18

## 2015-07-17 DIAGNOSIS — I10 Essential (primary) hypertension: Secondary | ICD-10-CM

## 2015-07-17 DIAGNOSIS — M150 Primary generalized (osteo)arthritis: Secondary | ICD-10-CM | POA: Diagnosis not present

## 2015-07-17 DIAGNOSIS — E119 Type 2 diabetes mellitus without complications: Secondary | ICD-10-CM | POA: Diagnosis not present

## 2015-07-17 DIAGNOSIS — M159 Polyosteoarthritis, unspecified: Secondary | ICD-10-CM

## 2015-07-17 MED ORDER — DIAZEPAM 2 MG PO TABS: ORAL_TABLET | ORAL | Status: DC

## 2015-07-17 MED ORDER — BUTALBITAL-APAP-CAFFEINE 50-325-40 MG PO TABS: 1.0000 | ORAL_TABLET | Freq: Every day | ORAL | Status: DC

## 2015-07-17 NOTE — Progress Notes (Signed)
Pre visit review using our clinic review tool, if applicable. No additional management support is needed unless otherwise documented below in the visit note. 

## 2015-07-17 NOTE — Progress Notes (Signed)
Subjective:    Patient ID: Christie [name], female    DOB: 1921-03-11, 81 y.o.   MRN: [id number]  HPI 81 year old patient who is seen today for follow-up.  She has a history of advanced osteoarthritis requires analgesics.  She is seen today for her quarterly evaluation.  She has type 2 diabetes which has been well controlled.  She has essential hypertension which has been stable.  No new concerns or complaints  Past Medical History  Diagnosis Date  . ANXIETY 01/31/2007  . DIABETES MELLITUS, TYPE II 01/31/2007  . Headache(784.0) 01/31/2007  . HYPERLIPIDEMIA 01/31/2007  . HYPERTENSION 01/31/2007  . HYPOKALEMIA, HX OF 02/09/2008  . OSTEOARTHRITIS 01/31/2007  . Gastric ulcer     Social History   Social History  . Marital Status: Widowed    Spouse Name: N/A  . Number of Children: 4  . Years of Education: N/A   Occupational History  . Retired    Social History Main Topics  . Smoking status: Former Smoker    Types: Cigarettes  . Smokeless tobacco: Never Used  . Alcohol Use: No  . Drug Use: No  . Sexual Activity: Not on file   Other Topics Concern  . Not on file   Social History Narrative    Past Surgical History  Procedure Laterality Date  . Abdominal hysterectomy    . Cataract extraction Left   . Esophagogastroduodenoscopy N/A 07/02/2014    Procedure: ESOPHAGOGASTRODUODENOSCOPY (EGD);  Surgeon: Hilarie Fredrickson, MD;  Location: [facility];  Service: Endoscopy;  Laterality: N/A;    Family History  Problem Relation Age of Onset  . Breast cancer Sister   . Heart disease Neg Hx   . Esophageal cancer Neg Hx   . Lung cancer Sister   . Prostate cancer Brother   . Colon cancer Neg Hx   . Colon polyps Neg Hx   . Stomach cancer Neg Hx     Allergies  Allergen Reactions  . Codeine     Current Outpatient Prescriptions on File Prior to Visit  Medication Sig Dispense Refill  . glucose blood test strip 1 each by Other route daily. 50 each 12  . LANCETS ULTRA FINE MISC 1 each by Does  not apply route daily. 100 each 3  . metFORMIN (GLUCOPHAGE-XR) 500 MG 24 hr tablet TAKE 2 TABLETS DAILY AS DIRECTED. 180 tablet 3  . pantoprazole (PROTONIX) 40 MG tablet TAKE 1 TABLET IN THE MORNING. 30 tablet 3   No current facility-administered medications on file prior to visit.    BP 140/82 mmHg  Pulse 71  Temp(Src) 97.4 F (36.3 C) (Oral)  Resp 18  SpO2 95%      Review of Systems  Constitutional: Negative.   HENT: Negative for congestion, dental problem, hearing loss, rhinorrhea, sinus pressure, sore throat and tinnitus.   Eyes: Negative for pain, discharge and visual disturbance.  Respiratory: Negative for cough and shortness of breath.   Cardiovascular: Negative for chest pain, palpitations and leg swelling.  Gastrointestinal: Negative for nausea, vomiting, abdominal pain, diarrhea, constipation, blood in stool and abdominal distention.  Genitourinary: Negative for dysuria, urgency, frequency, hematuria, flank pain, vaginal bleeding, vaginal discharge, difficulty urinating, vaginal pain and pelvic pain.  Musculoskeletal: Positive for back pain and arthralgias. Negative for joint swelling and gait problem.  Skin: Negative for rash.  Neurological: Negative for dizziness, syncope, speech difficulty, weakness, numbness and headaches.  Hematological: Negative for adenopathy.  Psychiatric/Behavioral: Negative for behavioral problems, dysphoric mood and agitation. The patient  is not nervous/anxious.        Objective:   Physical Exam  Constitutional: She is oriented to person, place, and time. She appears well-developed and well-nourished.  HENT:  Head: Normocephalic.  Right Ear: External ear normal.  Left Ear: External ear normal.  Mouth/Throat: Oropharynx is clear and moist.  Eyes: Conjunctivae and EOM are normal. Pupils are equal, round, and reactive to light.  Neck: Normal range of motion. Neck supple. No thyromegaly present.  Cardiovascular: Normal rate, regular  rhythm, normal heart sounds and intact distal pulses.   Pulmonary/Chest: Effort normal and breath sounds normal.  Abdominal: Soft. Bowel sounds are normal. She exhibits no mass. There is no tenderness.  Musculoskeletal: Normal range of motion.  Lymphadenopathy:    She has no cervical adenopathy.  Neurological: She is alert and oriented to person, place, and time.  Skin: Skin is warm and dry. No rash noted.  Psychiatric: She has a normal mood and affect. Her behavior is normal.          Assessment & Plan:   Essential hypertension, well-controlled Type 2 diabetes, stable  Medicines updated Recheck 3 months

## 2015-07-17 NOTE — Patient Instructions (Signed)
Limit your sodium (Salt) intake   Please check your hemoglobin A1c every 3-6  Months     

## 2015-07-25 ENCOUNTER — Ambulatory Visit: Payer: Medicare Other | Admitting: Internal Medicine

## 2015-07-26 ENCOUNTER — Ambulatory Visit: Payer: Medicare Other | Admitting: Internal Medicine

## 2015-10-15 ENCOUNTER — Encounter: Payer: Self-pay | Admitting: Internal Medicine

## 2015-10-15 ENCOUNTER — Ambulatory Visit (INDEPENDENT_AMBULATORY_CARE_PROVIDER_SITE_OTHER): Payer: Medicare Other | Admitting: Internal Medicine

## 2015-10-15 VITALS — BP 126/80 | HR 76 | Temp 98.5°F | Resp 18

## 2015-10-15 DIAGNOSIS — I10 Essential (primary) hypertension: Secondary | ICD-10-CM

## 2015-10-15 DIAGNOSIS — E119 Type 2 diabetes mellitus without complications: Secondary | ICD-10-CM | POA: Diagnosis not present

## 2015-10-15 LAB — HEMOGLOBIN A1C: HEMOGLOBIN A1C: 7 % — AB (ref 4.6–6.5)

## 2015-10-15 NOTE — Patient Instructions (Signed)
Limit your sodium (Salt) intake  Please check your blood pressure on a regular basis.  If it is consistently greater than 150/90, please make an office appointment.  Return in 3 months for follow-up  

## 2015-10-15 NOTE — Progress Notes (Signed)
Subjective:    Patient ID: Christie [name], female    DOB: 21-May-1921, 82 y.o.   MRN: [id number]  HPI  82 year old patient who is seen today for follow-up of essential hypertension and type 2 diabetes.  She has a history of chronic anxiety and is on the low-dose diazepam.  Her daughter monitors her medication use. Quite well.  She has advanced osteoarthritis and is barely ambulatory.  Uses a walker and wheelchair primarily  Past Medical History  Diagnosis Date  . ANXIETY 01/31/2007  . DIABETES MELLITUS, TYPE II 01/31/2007  . Headache(784.0) 01/31/2007  . HYPERLIPIDEMIA 01/31/2007  . HYPERTENSION 01/31/2007  . HYPOKALEMIA, HX OF 02/09/2008  . OSTEOARTHRITIS 01/31/2007  . Gastric ulcer     Social History   Social History  . Marital Status: Widowed    Spouse Name: N/A  . Number of Children: 4  . Years of Education: N/A   Occupational History  . Retired    Social History Main Topics  . Smoking status: Former Smoker    Types: Cigarettes  . Smokeless tobacco: Never Used  . Alcohol Use: No  . Drug Use: No  . Sexual Activity: Not on file   Other Topics Concern  . Not on file   Social History Narrative    Past Surgical History  Procedure Laterality Date  . Abdominal hysterectomy    . Cataract extraction Left   . Esophagogastroduodenoscopy N/A 07/02/2014    Procedure: ESOPHAGOGASTRODUODENOSCOPY (EGD);  Surgeon: Hilarie FredricksonJohn N Perry, MD;  Location: [facility];  Service: Endoscopy;  Laterality: N/A;    Family History  Problem Relation Age of Onset  . Breast cancer Sister   . Heart disease Neg Hx   . Esophageal cancer Neg Hx   . Lung cancer Sister   . Prostate cancer Brother   . Colon cancer Neg Hx   . Colon polyps Neg Hx   . Stomach cancer Neg Hx     Allergies  Allergen Reactions  . Codeine     Current Outpatient Prescriptions on File Prior to Visit  Medication Sig Dispense Refill  . butalbital-acetaminophen-caffeine (FIORICET, ESGIC) 50-325-40 MG tablet Take 1 tablet by  mouth daily. 30 tablet 5  . diazepam (VALIUM) 2 MG tablet TAKE ONE TABLET EVERY 6 HOURS AS NEEDED FOR ANXIETY AND 1 TABLET AT BEDTIME AS DIRECTED. 30 tablet 5  . glucose blood test strip 1 each by Other route daily. 50 each 12  . LANCETS ULTRA FINE MISC 1 each by Does not apply route daily. 100 each 3  . metFORMIN (GLUCOPHAGE-XR) 500 MG 24 hr tablet TAKE 2 TABLETS DAILY AS DIRECTED. 180 tablet 3  . pantoprazole (PROTONIX) 40 MG tablet TAKE 1 TABLET IN THE MORNING. 30 tablet 3   No current facility-administered medications on file prior to visit.    BP 126/80 mmHg  Pulse 76  Temp(Src) 98.5 F (36.9 C) (Oral)  Resp 18  SpO2 97%     Review of Systems  Constitutional: Negative.   HENT: Negative for congestion, dental problem, hearing loss, rhinorrhea, sinus pressure, sore throat and tinnitus.   Eyes: Negative for pain, discharge and visual disturbance.  Respiratory: Negative for cough and shortness of breath.   Cardiovascular: Negative for chest pain, palpitations and leg swelling.  Gastrointestinal: Negative for nausea, vomiting, abdominal pain, diarrhea, constipation, blood in stool and abdominal distention.  Genitourinary: Negative for dysuria, urgency, frequency, hematuria, flank pain, vaginal bleeding, vaginal discharge, difficulty urinating, vaginal pain and pelvic pain.  Musculoskeletal: Positive for  back pain, arthralgias and gait problem. Negative for joint swelling.  Skin: Negative for rash.  Neurological: Negative for dizziness, syncope, speech difficulty, weakness, numbness and headaches.  Hematological: Negative for adenopathy.  Psychiatric/Behavioral: Negative for behavioral problems, dysphoric mood and agitation. The patient is not nervous/anxious.        Objective:   Physical Exam  Constitutional: She is oriented to person, place, and time. She appears well-developed and well-nourished.  Sitting in wheelchair Blood pressure well controlled  HENT:  Head:  Normocephalic.  Right Ear: External ear normal.  Left Ear: External ear normal.  Mouth/Throat: Oropharynx is clear and moist.  Eyes: Conjunctivae and EOM are normal. Pupils are equal, round, and reactive to light.  Neck: Normal range of motion. Neck supple. No thyromegaly present.  Cardiovascular: Normal rate, regular rhythm, normal heart sounds and intact distal pulses.   Pulmonary/Chest: Effort normal and breath sounds normal.  Abdominal: Soft. Bowel sounds are normal. She exhibits no mass. There is no tenderness.  Musculoskeletal: Normal range of motion. She exhibits edema.  Plus 2 ankle and pedal edema  Lymphadenopathy:    She has no cervical adenopathy.  Neurological: She is alert and oriented to person, place, and time.  Skin: Skin is warm and dry. No rash noted.  Psychiatric: She has a normal mood and affect. Her behavior is normal.          Assessment & Plan:   Diabetes mellitus type 2.  Will check a hemoglobin A1c Essential hypertension, stable Anxiety disorder Osteoarthritis.  No change in regimen  Recheck 3-6 months

## 2015-10-15 NOTE — Progress Notes (Signed)
Pre visit review using our clinic review tool, if applicable. No additional management support is needed unless otherwise documented below in the visit note. 

## 2015-10-16 ENCOUNTER — Other Ambulatory Visit: Payer: Self-pay | Admitting: Internal Medicine

## 2015-10-31 ENCOUNTER — Other Ambulatory Visit: Payer: Self-pay | Admitting: Internal Medicine

## 2015-12-02 ENCOUNTER — Other Ambulatory Visit: Payer: Self-pay | Admitting: Internal Medicine

## 2015-12-02 NOTE — Telephone Encounter (Signed)
Last OV 10/15/15... Last filled 10/16/15, #30 with 2 refills...  Spoke with the pharmacy, Pt picked up Rx on 10/16/15, 10/31/15 and 11/14/15... Okay to refill?

## 2015-12-02 NOTE — Telephone Encounter (Signed)
Medications are refilled at time of office visit

## 2015-12-03 ENCOUNTER — Telehealth: Payer: Self-pay | Admitting: Internal Medicine

## 2015-12-03 MED ORDER — DIAZEPAM 2 MG PO TABS: ORAL_TABLET | ORAL | Status: DC

## 2015-12-03 NOTE — Telephone Encounter (Signed)
Pt daughter eloise  need donna to return concerning the denial of diazepam

## 2015-12-03 NOTE — Telephone Encounter (Signed)
Spoke to [place] told her Rx for Diazepam was called into pharmacy. I am not sure who denied it but we did not. [name] verbalized understanding. Rx called into pharmacy.

## 2015-12-09 ENCOUNTER — Ambulatory Visit (INDEPENDENT_AMBULATORY_CARE_PROVIDER_SITE_OTHER): Payer: Medicare Other | Admitting: Podiatry

## 2015-12-09 ENCOUNTER — Encounter: Payer: Self-pay | Admitting: Podiatry

## 2015-12-09 DIAGNOSIS — M79675 Pain in left toe(s): Secondary | ICD-10-CM | POA: Diagnosis not present

## 2015-12-09 DIAGNOSIS — E1140 Type 2 diabetes mellitus with diabetic neuropathy, unspecified: Secondary | ICD-10-CM

## 2015-12-09 DIAGNOSIS — M79674 Pain in right toe(s): Secondary | ICD-10-CM | POA: Diagnosis not present

## 2015-12-09 DIAGNOSIS — B351 Tinea unguium: Secondary | ICD-10-CM | POA: Diagnosis not present

## 2015-12-09 NOTE — Progress Notes (Signed)
Patient ID: Christie [name], female   DOB: March 30, 1921, 82 y.o.   MRN: [id number]  Subjective: 82 y.o. returns the office today for painful, elongated, thickened toenails which she cannot trim herself. Denies any redness or drainage around the nails. Denies any acute changes since last appointment and no new complaints today. Denies any systemic complaints such as fevers, chills, nausea, vomiting.   Objective: AAO 3, NAD DP/PT pulses palpable, CRT less than 3 seconds Nails hypertrophic, dystrophic, elongated, brittle, discolored 10. There is tenderness overlying the nails 1-5 bilaterally. There is no surrounding erythema or drainage along the nail sites. No open lesions or pre-ulcerative lesions are identified. No other areas of tenderness bilateral lower extremities. No overlying edema, erythema, increased warmth. No pain with calf compression, swelling, warmth, erythema.  Assessment: Patient presents with symptomatic onychomycosis  Plan: -Treatment options including alternatives, risks, complications were discussed -Nails sharply debrided 10 without complication/bleeding. -Discussed daily foot inspection. If there are any changes, to call the office immediately.  -Follow-up in 3 months or sooner if any problems are to arise. In the meantime, encouraged to call the office with any questions, concerns, changes symptoms.  Ovid CurdMatthew Shamarion Coots, DPM

## 2016-01-15 ENCOUNTER — Ambulatory Visit (INDEPENDENT_AMBULATORY_CARE_PROVIDER_SITE_OTHER): Payer: Medicare Other | Admitting: Internal Medicine

## 2016-01-15 ENCOUNTER — Encounter: Payer: Self-pay | Admitting: Internal Medicine

## 2016-01-15 VITALS — BP 122/70 | HR 76 | Temp 98.5°F | Resp 18

## 2016-01-15 DIAGNOSIS — M150 Primary generalized (osteo)arthritis: Secondary | ICD-10-CM | POA: Diagnosis not present

## 2016-01-15 DIAGNOSIS — E119 Type 2 diabetes mellitus without complications: Secondary | ICD-10-CM

## 2016-01-15 DIAGNOSIS — M159 Polyosteoarthritis, unspecified: Secondary | ICD-10-CM

## 2016-01-15 DIAGNOSIS — I10 Essential (primary) hypertension: Secondary | ICD-10-CM

## 2016-01-15 LAB — HEMOGLOBIN A1C: Hgb A1c MFr Bld: 7 % — ABNORMAL HIGH (ref 4.6–6.5)

## 2016-01-15 MED ORDER — DIAZEPAM 2 MG PO TABS: ORAL_TABLET | ORAL | Status: DC

## 2016-01-15 MED ORDER — BUTALBITAL-APAP-CAFFEINE 50-325-40 MG PO TABS: 1.0000 | ORAL_TABLET | Freq: Every day | ORAL | Status: DC

## 2016-01-15 NOTE — Patient Instructions (Signed)
Limit your sodium (Salt) intake   Please check your hemoglobin A1c every 3 months  Please check your blood pressure on a regular basis.  If it is consistently greater than 150/90, please make an office appointment.   

## 2016-01-15 NOTE — Progress Notes (Signed)
Pre visit review using our clinic review tool, if applicable. No additional management support is needed unless otherwise documented below in the visit note. 

## 2016-01-15 NOTE — Progress Notes (Signed)
Subjective:    Patient ID: Christie [name], female    DOB: 1920-10-01, 82 y.o.   MRN: [id number]  HPI  Lab Results  Component Value Date   HGBA1C 7.0* 10/15/2015   BP Readings from Last 3 Encounters:  01/15/16 122/70  10/15/15 126/80  07/17/15 13140/5282   82 year old patient who is seen today for her quarterly follow-up.  She has type 2 diabetes that has been managed with metformin therapy.  She has essential hypertension, dyslipidemia.  She has a history of significant arthritis. She has anxiety. In general doing about the same.  No new concerns or complaints Since her last visit here, she has had some worsening pedal edema  Wt Readings from Last 3 Encounters:  03/25/15 126 lb (57.153 kg)  09/21/14 131 lb (59.421 kg)  09/14/14 128 lb 3.2 oz (58.151 kg)    Past Medical History  Diagnosis Date  . ANXIETY 01/31/2007  . DIABETES MELLITUS, TYPE II 01/31/2007  . Headache(784.0) 01/31/2007  . HYPERLIPIDEMIA 01/31/2007  . HYPERTENSION 01/31/2007  . HYPOKALEMIA, HX OF 02/09/2008  . OSTEOARTHRITIS 01/31/2007  . Gastric ulcer      Social History   Social History  . Marital Status: Widowed    Spouse Name: N/A  . Number of Children: 4  . Years of Education: N/A   Occupational History  . Retired    Social History Main Topics  . Smoking status: Former Smoker    Types: Cigarettes  . Smokeless tobacco: Never Used  . Alcohol Use: No  . Drug Use: No  . Sexual Activity: Not on file   Other Topics Concern  . Not on file   Social History Narrative    Past Surgical History  Procedure Laterality Date  . Abdominal hysterectomy    . Cataract extraction Left   . Esophagogastroduodenoscopy N/A 07/02/2014    Procedure: ESOPHAGOGASTRODUODENOSCOPY (EGD);  Surgeon: Hilarie FredricksonJohn N Perry, MD;  Location: [facility];  Service: Endoscopy;  Laterality: N/A;    Family History  Problem Relation Age of Onset  . Breast cancer Sister   . Heart disease Neg Hx   . Esophageal cancer Neg Hx   . Lung cancer  Sister   . Prostate cancer Brother   . Colon cancer Neg Hx   . Colon polyps Neg Hx   . Stomach cancer Neg Hx     Allergies  Allergen Reactions  . Codeine     Current Outpatient Prescriptions on File Prior to Visit  Medication Sig Dispense Refill  . glucose blood test strip 1 each by Other route daily. 50 each 12  . LANCETS ULTRA FINE MISC 1 each by Does not apply route daily. 100 each 3  . metFORMIN (GLUCOPHAGE-XR) 500 MG 24 hr tablet TAKE 2 TABLETS DAILY AS DIRECTED. 180 tablet 3  . pantoprazole (PROTONIX) 40 MG tablet TAKE 1 TABLET IN THE MORNING. 30 tablet 5   No current facility-administered medications on file prior to visit.    BP 122/70 mmHg  Pulse 76  Temp(Src) 98.5 F (36.9 C) (Oral)  Resp 18  SpO2 96%     Review of Systems  Constitutional: Negative.   HENT: Negative for congestion, dental problem, hearing loss, rhinorrhea, sinus pressure, sore throat and tinnitus.   Eyes: Negative for pain, discharge and visual disturbance.  Respiratory: Negative for cough and shortness of breath.   Cardiovascular: Positive for leg swelling. Negative for chest pain and palpitations.  Gastrointestinal: Negative for nausea, vomiting, abdominal pain, diarrhea, constipation, blood in stool  and abdominal distention.  Genitourinary: Negative for dysuria, urgency, frequency, hematuria, flank pain, vaginal bleeding, vaginal discharge, difficulty urinating, vaginal pain and pelvic pain.  Musculoskeletal: Positive for back pain, arthralgias and gait problem. Negative for joint swelling.  Skin: Negative for rash.  Neurological: Negative for dizziness, syncope, speech difficulty, weakness, numbness and headaches.  Hematological: Negative for adenopathy.  Psychiatric/Behavioral: Negative for behavioral problems, dysphoric mood and agitation. The patient is not nervous/anxious.        Objective:   Physical Exam  Constitutional: She is oriented to person, place, and time. She appears  well-developed and well-nourished.  Repeat blood pressure 130/80  HENT:  Head: Normocephalic.  Right Ear: External ear normal.  Left Ear: External ear normal.  Mouth/Throat: Oropharynx is clear and moist.  Eyes: Conjunctivae and EOM are normal. Pupils are equal, round, and reactive to light.  Neck: Normal range of motion. Neck supple. No thyromegaly present.  Cardiovascular: Normal rate, regular rhythm and normal heart sounds.   Pulmonary/Chest: Effort normal and breath sounds normal. No respiratory distress. She has no wheezes. She has no rales.  Abdominal: Soft. Bowel sounds are normal. She exhibits no mass. There is no tenderness.  Musculoskeletal: Normal range of motion. She exhibits edema.  Lymphadenopathy:    She has no cervical adenopathy.  Neurological: She is alert and oriented to person, place, and time.  Skin: Skin is warm and dry. No rash noted.  Psychiatric: She has a normal mood and affect. Her behavior is normal.          Assessment & Plan:   Hypertension, well-controlled Type 2 diabetes.  Will check a hemoglobin A1c Osteoarthritis, stable.  Continue present regimen Anxiety disorder  Recheck 3 months.  Full laboratory screen at that time  [name], MD

## 2016-03-16 ENCOUNTER — Encounter: Payer: Self-pay | Admitting: Podiatry

## 2016-03-16 ENCOUNTER — Ambulatory Visit (INDEPENDENT_AMBULATORY_CARE_PROVIDER_SITE_OTHER): Payer: Medicare Other | Admitting: Podiatry

## 2016-03-16 DIAGNOSIS — E1140 Type 2 diabetes mellitus with diabetic neuropathy, unspecified: Secondary | ICD-10-CM | POA: Diagnosis not present

## 2016-03-16 DIAGNOSIS — B351 Tinea unguium: Secondary | ICD-10-CM | POA: Diagnosis not present

## 2016-03-16 DIAGNOSIS — M79674 Pain in right toe(s): Secondary | ICD-10-CM | POA: Diagnosis not present

## 2016-03-16 DIAGNOSIS — M79675 Pain in left toe(s): Secondary | ICD-10-CM

## 2016-03-16 NOTE — Progress Notes (Signed)
Subjective: 82 y.o. returns the office today for painful, elongated, thickened toenails which she cannot trim herself. Denies any redness or drainage around the nails. Denies any acute changes since last appointment and no new complaints today. Denies any systemic complaints such as fevers, chills, nausea, vomiting.   Objective: AAO 3, NAD DP/PT pulses palpable, CRT less than 3 seconds Nails hypertrophic, dystrophic, elongated, brittle, discolored 10. There is tenderness overlying the nails 1-5 bilaterally. There is no surrounding erythema or drainage along the nail sites. No open lesions or pre-ulcerative lesions are identified. No other areas of tenderness bilateral lower extremities. No overlying edema, erythema, increased warmth. No pain with calf compression, swelling, warmth, erythema.  Assessment: Patient presents with symptomatic onychomycosis  Plan: -Treatment options including alternatives, risks, complications were discussed -Nails sharply debrided 10 without complication/bleeding. -Discussed daily foot inspection. If there are any changes, to call the office immediately.  -Follow-up in 3 months or sooner if any problems are to arise. In the meantime, encouraged to call the office with any questions, concerns, changes symptoms.  Matthew Wagoner, DPM  

## 2016-04-08 ENCOUNTER — Other Ambulatory Visit: Payer: Self-pay | Admitting: Internal Medicine

## 2016-04-15 ENCOUNTER — Encounter: Payer: Self-pay | Admitting: Internal Medicine

## 2016-04-15 ENCOUNTER — Ambulatory Visit (INDEPENDENT_AMBULATORY_CARE_PROVIDER_SITE_OTHER): Payer: Medicare Other | Admitting: Internal Medicine

## 2016-04-15 VITALS — BP 130/80 | HR 76 | Temp 98.5°F | Resp 18

## 2016-04-15 DIAGNOSIS — E119 Type 2 diabetes mellitus without complications: Secondary | ICD-10-CM

## 2016-04-15 DIAGNOSIS — K257 Chronic gastric ulcer without hemorrhage or perforation: Secondary | ICD-10-CM

## 2016-04-15 DIAGNOSIS — E1140 Type 2 diabetes mellitus with diabetic neuropathy, unspecified: Secondary | ICD-10-CM

## 2016-04-15 DIAGNOSIS — I10 Essential (primary) hypertension: Secondary | ICD-10-CM | POA: Diagnosis not present

## 2016-04-15 DIAGNOSIS — E785 Hyperlipidemia, unspecified: Secondary | ICD-10-CM

## 2016-04-15 LAB — COMPREHENSIVE METABOLIC PANEL
ALK PHOS: 65 U/L (ref 39–117)
ALT: 8 U/L (ref 0–35)
AST: 11 U/L (ref 0–37)
Albumin: 3.7 g/dL (ref 3.5–5.2)
BILIRUBIN TOTAL: 0.4 mg/dL (ref 0.2–1.2)
BUN: 23 mg/dL (ref 6–23)
CALCIUM: 9.1 mg/dL (ref 8.4–10.5)
CO2: 25 mEq/L (ref 19–32)
Chloride: 108 mEq/L (ref 96–112)
Creatinine, Ser: 0.8 mg/dL (ref 0.40–1.20)
GFR: 86.08 mL/min (ref >60.00)
Glucose, Bld: 136 mg/dL — ABNORMAL HIGH (ref 70–99)
POTASSIUM: 3.8 meq/L (ref 3.5–5.1)
Sodium: 142 mEq/L (ref 135–145)
TOTAL PROTEIN: 7 g/dL (ref 6.0–8.3)

## 2016-04-15 LAB — LIPID PANEL
Cholesterol: 200 mg/dL (ref 0–200)
HDL: 67 mg/dL (ref >39.00)
LDL Cholesterol: 109 mg/dL — ABNORMAL HIGH (ref 0–99)
NonHDL: 133.01
TRIGLYCERIDES: 122 mg/dL (ref 0.0–149.0)
Total CHOL/HDL Ratio: 3
VLDL: 24.4 mg/dL (ref 0.0–40.0)

## 2016-04-15 LAB — TSH: TSH: 1.23 u[IU]/mL (ref 0.35–4.50)

## 2016-04-15 LAB — CBC WITH DIFFERENTIAL/PLATELET
BASOS ABS: 0 10*3/uL (ref 0.0–0.1)
Basophils Relative: 0.5 % (ref 0.0–3.0)
EOS ABS: 0.1 10*3/uL (ref 0.0–0.7)
Eosinophils Relative: 1.3 % (ref 0.0–5.0)
HEMATOCRIT: 36.1 % (ref 36.0–46.0)
Hemoglobin: 12 g/dL (ref 12.0–15.0)
LYMPHS PCT: 30.2 % (ref 12.0–46.0)
Lymphs Abs: 1.5 10*3/uL (ref 0.7–4.0)
MCHC: 33.4 g/dL (ref 30.0–36.0)
MCV: 81.6 fl (ref 78.0–100.0)
Monocytes Absolute: 0.6 10*3/uL (ref 0.1–1.0)
Monocytes Relative: 11.7 % (ref 3.0–12.0)
Neutro Abs: 2.7 10*3/uL (ref 1.4–7.7)
Neutrophils Relative %: 56.3 % (ref 43.0–77.0)
PLATELETS: 144 10*3/uL — AB (ref 150.0–400.0)
RBC: 4.42 Mil/uL (ref 3.87–5.11)
RDW: 16.5 % — AB (ref 11.5–15.5)
WBC: 4.9 10*3/uL (ref 4.0–10.5)

## 2016-04-15 LAB — HEMOGLOBIN A1C: HEMOGLOBIN A1C: 6.4 % (ref 4.6–6.5)

## 2016-04-15 MED ORDER — METFORMIN HCL ER 500 MG PO TB24: ORAL_TABLET | ORAL | 3 refills | Status: DC

## 2016-04-15 MED ORDER — PANTOPRAZOLE SODIUM 40 MG PO TBEC: 40.0000 mg | DELAYED_RELEASE_TABLET | Freq: Every morning | ORAL | 11 refills | Status: DC

## 2016-04-15 NOTE — Progress Notes (Signed)
Pre visit review using our clinic review tool, if applicable. No additional management support is needed unless otherwise documented below in the visit note. 

## 2016-04-15 NOTE — Patient Instructions (Signed)
Limit your sodium (Salt) intake   Please check your hemoglobin A1c every 3-6 months   

## 2016-04-15 NOTE — Progress Notes (Signed)
Subjective:    Patient ID: Christie [name], female    DOB: Aug 03, 1920, 82 y.o.   MRN: [id number]  HPI  82 year old patient who is seen today for follow-up.  She has a history of type 2 diabetes.  She has a remote history of gastric ulcer disease.  She has a history of hypertension, but presently controlled off medication. She has advanced osteoarthritis. No major concerns or complaints today. No recent eye examination in a number of years.  This was encouraged Declines flu vaccine today  Past Medical History:  Diagnosis Date  . ANXIETY 01/31/2007  . DIABETES MELLITUS, TYPE II 01/31/2007  . Gastric ulcer   . Headache(784.0) 01/31/2007  . HYPERLIPIDEMIA 01/31/2007  . HYPERTENSION 01/31/2007  . HYPOKALEMIA, HX OF 02/09/2008  . OSTEOARTHRITIS 01/31/2007     Social History   Social History  . Marital status: Widowed    Spouse name: N/A  . Number of children: 4  . Years of education: N/A   Occupational History  . Retired    Social History Main Topics  . Smoking status: Former Smoker    Types: Cigarettes  . Smokeless tobacco: Never Used  . Alcohol use No  . Drug use: No  . Sexual activity: Not on file   Other Topics Concern  . Not on file   Social History Narrative  . No narrative on file    Past Surgical History:  Procedure Laterality Date  . ABDOMINAL HYSTERECTOMY    . CATARACT EXTRACTION Left   . ESOPHAGOGASTRODUODENOSCOPY N/A 07/02/2014   Procedure: ESOPHAGOGASTRODUODENOSCOPY (EGD);  Surgeon: Hilarie FredricksonJohn N Perry, MD;  Location: [facility];  Service: Endoscopy;  Laterality: N/A;    Family History  Problem Relation Age of Onset  . Breast cancer Sister   . Heart disease Neg Hx   . Esophageal cancer Neg Hx   . Lung cancer Sister   . Prostate cancer Brother   . Colon cancer Neg Hx   . Colon polyps Neg Hx   . Stomach cancer Neg Hx     Allergies  Allergen Reactions  . Codeine     Current Outpatient Prescriptions on File Prior to Visit  Medication Sig Dispense Refill    . butalbital-acetaminophen-caffeine (FIORICET, ESGIC) 50-325-40 MG tablet Take 1 tablet by mouth daily. 30 tablet 5  . diazepam (VALIUM) 2 MG tablet TAKE ONE TABLET EVERY 6 HOURS AS NEEDED FOR ANXIETY AND 1 TABLET AT BEDTIME AS DIRECTED. 30 tablet 2  . glucose blood test strip 1 each by Other route daily. 50 each 12  . LANCETS ULTRA FINE MISC 1 each by Does not apply route daily. 100 each 3   No current facility-administered medications on file prior to visit.     BP 130/80 (BP Location: Left Arm, Patient Position: Sitting, Cuff Size: Normal)   Pulse 76   Temp 98.5 F (36.9 C) (Oral)   Resp 18   SpO2 95%     Review of Systems  Constitutional: Negative.   HENT: Negative for congestion, dental problem, hearing loss, rhinorrhea, sinus pressure, sore throat and tinnitus.   Eyes: Negative for pain, discharge and visual disturbance.  Respiratory: Negative for cough and shortness of breath.   Cardiovascular: Positive for leg swelling. Negative for chest pain and palpitations.  Gastrointestinal: Negative for abdominal distention, abdominal pain, blood in stool, constipation, diarrhea, nausea and vomiting.  Genitourinary: Negative for difficulty urinating, dysuria, flank pain, frequency, hematuria, pelvic pain, urgency, vaginal bleeding, vaginal discharge and vaginal pain.  Musculoskeletal: Positive  for arthralgias, back pain and gait problem. Negative for joint swelling.  Skin: Negative for rash.  Neurological: Negative for dizziness, syncope, speech difficulty, weakness, numbness and headaches.  Hematological: Negative for adenopathy.  Psychiatric/Behavioral: Negative for agitation, behavioral problems and dysphoric mood. The patient is not nervous/anxious.        Objective:   Physical Exam  Constitutional: She is oriented to person, place, and time. She appears well-developed and well-nourished.  Blood pressure 130/80 Wheelchair bound Alert and appropriate Accompanied by daughter   HENT:  Head: Normocephalic.  Right Ear: External ear normal.  Left Ear: External ear normal.  Mouth/Throat: Oropharynx is clear and moist.  Eyes: Conjunctivae and EOM are normal. Pupils are equal, round, and reactive to light.  Neck: Normal range of motion. Neck supple. No thyromegaly present.  Cardiovascular: Normal rate, regular rhythm, normal heart sounds and intact distal pulses.   Pulmonary/Chest: Effort normal and breath sounds normal.  Abdominal: Soft. Bowel sounds are normal. She exhibits no mass. There is no tenderness.  Musculoskeletal: Normal range of motion. She exhibits edema.  Lymphadenopathy:    She has no cervical adenopathy.  Neurological: She is alert and oriented to person, place, and time.  Skin: Skin is warm and dry. No rash noted.  Psychiatric: She has a normal mood and affect. Her behavior is normal.          Assessment & Plan:   Diabetes mellitus with peripheral neuropathy Pedal edema, stable Osteoarthritis History of gastric ulcer.  We'll check CBC Essential hypertension.  Remains controlled off medications Anxiety disorder, stable  Annual flu vaccine, declined Check updated lab  Recheck 3-6 months  Christie [name]

## 2016-04-16 ENCOUNTER — Ambulatory Visit: Payer: Medicare Other | Admitting: Internal Medicine

## 2016-04-17 ENCOUNTER — Ambulatory Visit: Payer: Medicare Other | Admitting: Internal Medicine

## 2016-05-20 ENCOUNTER — Other Ambulatory Visit: Payer: Self-pay | Admitting: Internal Medicine

## 2016-06-26 ENCOUNTER — Ambulatory Visit: Payer: Medicare Other | Admitting: Podiatry

## 2016-07-07 ENCOUNTER — Other Ambulatory Visit: Payer: Self-pay | Admitting: Internal Medicine

## 2016-07-08 ENCOUNTER — Other Ambulatory Visit: Payer: Self-pay | Admitting: Internal Medicine

## 2016-07-10 NOTE — Telephone Encounter (Signed)
Okay.  Needs follow-up in January

## 2016-07-15 ENCOUNTER — Encounter: Payer: Self-pay | Admitting: Internal Medicine

## 2016-07-15 ENCOUNTER — Ambulatory Visit (INDEPENDENT_AMBULATORY_CARE_PROVIDER_SITE_OTHER): Payer: Medicare Other | Admitting: Internal Medicine

## 2016-07-15 VITALS — BP 125/80 | HR 78 | Temp 98.2°F | Ht 60.0 in

## 2016-07-15 DIAGNOSIS — E1140 Type 2 diabetes mellitus with diabetic neuropathy, unspecified: Secondary | ICD-10-CM | POA: Diagnosis not present

## 2016-07-15 DIAGNOSIS — M150 Primary generalized (osteo)arthritis: Secondary | ICD-10-CM

## 2016-07-15 DIAGNOSIS — M159 Polyosteoarthritis, unspecified: Secondary | ICD-10-CM

## 2016-07-15 DIAGNOSIS — I10 Essential (primary) hypertension: Secondary | ICD-10-CM

## 2016-07-15 NOTE — Patient Instructions (Signed)
Limit your sodium (Salt) intake   Please check your hemoglobin A1c every 3-6  Months  Return in 3 months for follow-up

## 2016-07-15 NOTE — Progress Notes (Signed)
Pre visit review using our clinic review tool, if applicable. No additional management support is needed unless otherwise documented below in the visit note. 

## 2016-07-15 NOTE — Progress Notes (Signed)
Subjective:    Patient ID: Christie ArmsRoxie Kantor, female    DOB: July 20, 1921, 82 y.o.   MRN: [id number]  HPI   Lab Results  Component Value Date   HGBA1C 6.4 04/15/2016    BP Readings from Last 3 Encounters:  07/15/16 125/80  04/15/16 130/80  01/15/16 33122/6670   82 year old patient who is seen today for follow-up.  She has a history of type 2 diabetes which has been controlled on metformin therapy only.  She has a history of osteoarthritis.  She has a history of anxiety disorder and does take diazepam once daily as needed.  No new concerns or complaints.  She is accompanied by her daughter.  Laboratory studies were reviewed from 3 months ago.  Hemoglobin A1c was 6.4. Daughter does check occasional blood sugars with nice glycemic control  Past Medical History:  Diagnosis Date  . ANXIETY 01/31/2007  . DIABETES MELLITUS, TYPE II 01/31/2007  . Gastric ulcer   . Headache(784.0) 01/31/2007  . HYPERLIPIDEMIA 01/31/2007  . HYPERTENSION 01/31/2007  . HYPOKALEMIA, HX OF 02/09/2008  . OSTEOARTHRITIS 01/31/2007     Social History   Social History  . Marital status: Widowed    Spouse name: N/A  . Number of children: 4  . Years of education: N/A   Occupational History  . Retired    Social History Main Topics  . Smoking status: Former Smoker    Types: Cigarettes  . Smokeless tobacco: Never Used  . Alcohol use No  . Drug use: No  . Sexual activity: Not on file   Other Topics Concern  . Not on file   Social History Narrative  . No narrative on file    Past Surgical History:  Procedure Laterality Date  . ABDOMINAL HYSTERECTOMY    . CATARACT EXTRACTION Left   . ESOPHAGOGASTRODUODENOSCOPY N/A 07/02/2014   Procedure: ESOPHAGOGASTRODUODENOSCOPY (EGD);  Surgeon: Hilarie FredricksonJohn N Perry, MD;  Location: [facility];  Service: Endoscopy;  Laterality: N/A;    Family History  Problem Relation Age of Onset  . Breast cancer Sister   . Heart disease Neg Hx   . Esophageal cancer Neg Hx   . Lung cancer Sister    . Prostate cancer Brother   . Colon cancer Neg Hx   . Colon polyps Neg Hx   . Stomach cancer Neg Hx     Allergies  Allergen Reactions  . Codeine     Current Outpatient Prescriptions on File Prior to Visit  Medication Sig Dispense Refill  . butalbital-acetaminophen-caffeine (FIORICET, ESGIC) 50-325-40 MG tablet TAKE 1 TABLET ONCE DAILY. 30 tablet 2  . diazepam (VALIUM) 2 MG tablet TAKE ONE TABLET EVERY 6 HOURS AS NEEDED FOR ANXIETY AND 1 TABLET AT BEDTIME AS DIRECTED. 30 tablet 2  . glucose blood test strip 1 each by Other route daily. 50 each 12  . LANCETS ULTRA FINE MISC 1 each by Does not apply route daily. 100 each 3  . metFORMIN (GLUCOPHAGE-XR) 500 MG 24 hr tablet TAKE 2 TABLETS DAILY AS DIRECTED. 180 tablet 3  . pantoprazole (PROTONIX) 40 MG tablet Take 1 tablet (40 mg total) by mouth every morning. 30 tablet 11   No current facility-administered medications on file prior to visit.     BP 125/80 (BP Location: Left Arm, Patient Position: Sitting, Cuff Size: Normal)   Pulse 78   Temp 98.2 F (36.8 C) (Oral)   Ht 5' (1.524 m)   SpO2 96%    Review of Systems  Constitutional: Negative.  HENT: Negative for congestion, dental problem, hearing loss, rhinorrhea, sinus pressure, sore throat and tinnitus.   Eyes: Negative for pain, discharge and visual disturbance.  Respiratory: Negative for cough and shortness of breath.   Cardiovascular: Negative for chest pain, palpitations and leg swelling.  Gastrointestinal: Negative for abdominal distention, abdominal pain, blood in stool, constipation, diarrhea, nausea and vomiting.  Genitourinary: Negative for difficulty urinating, dysuria, flank pain, frequency, hematuria, pelvic pain, urgency, vaginal bleeding, vaginal discharge and vaginal pain.  Musculoskeletal: Positive for arthralgias, back pain and gait problem. Negative for joint swelling.  Skin: Negative for rash.  Neurological: Negative for dizziness, syncope, speech  difficulty, weakness, numbness and headaches.  Hematological: Negative for adenopathy.  Psychiatric/Behavioral: Negative for agitation, behavioral problems and dysphoric mood. The patient is nervous/anxious.        Objective:   Physical Exam  Constitutional: She is oriented to person, place, and time. She appears well-developed and well-nourished.  Blood pressure 140/80  HENT:  Head: Normocephalic.  Right Ear: External ear normal.  Left Ear: External ear normal.  Mouth/Throat: Oropharynx is clear and moist.  Eyes: Conjunctivae and EOM are normal. Pupils are equal, round, and reactive to light.  Neck: Normal range of motion. Neck supple. No thyromegaly present.  Cardiovascular: Normal rate, regular rhythm, normal heart sounds and intact distal pulses.   Pulmonary/Chest: Effort normal and breath sounds normal.  Abdominal: Soft. Bowel sounds are normal. She exhibits no mass. There is no tenderness.  Musculoskeletal: Normal range of motion. She exhibits edema.  Plus 2 ankle and peel edema unchanged  Lymphadenopathy:    She has no cervical adenopathy.  Neurological: She is alert and oriented to person, place, and time.  Skin: Skin is warm and dry. No rash noted.  Psychiatric: She has a normal mood and affect. Her behavior is normal.          Assessment & Plan:   Diabetes mellitus.  Well-controlled.  No change in therapy Osteoarthritis, stable Anxiety disorder.  Medications updated  Follow-up 3 months  KWIATKOWSKI,PETER Homero FellersFRANK

## 2016-08-19 ENCOUNTER — Telehealth: Payer: Self-pay | Admitting: Internal Medicine

## 2016-08-20 ENCOUNTER — Other Ambulatory Visit: Payer: Self-pay | Admitting: Internal Medicine

## 2016-08-20 MED ORDER — DIAZEPAM 2 MG PO TABS: ORAL_TABLET | ORAL | 2 refills | Status: DC

## 2016-08-20 NOTE — Telephone Encounter (Signed)
[facility] can only deliver to pt today because of her address.  Looks like this rx diazepam (VALIUM) 2 MG tablet  Was printed for today. Can you resend asap? Thank you!

## 2016-08-20 NOTE — Telephone Encounter (Signed)
Rx was printed for Dr. Kirtland BouchardK to sign tomorrow when he returns to the office.

## 2016-08-21 NOTE — Telephone Encounter (Signed)
Rx was faxed to pharmacy.  

## 2016-09-28 ENCOUNTER — Other Ambulatory Visit: Payer: Self-pay | Admitting: Internal Medicine

## 2016-09-29 ENCOUNTER — Other Ambulatory Visit: Payer: Self-pay | Admitting: Internal Medicine

## 2016-10-02 ENCOUNTER — Ambulatory Visit: Payer: Medicare Other | Admitting: Podiatry

## 2016-10-07 ENCOUNTER — Ambulatory Visit: Payer: Medicare Other | Admitting: Podiatry

## 2016-10-19 ENCOUNTER — Ambulatory Visit: Payer: Medicare Other | Admitting: Podiatry

## 2016-10-21 ENCOUNTER — Other Ambulatory Visit: Payer: Self-pay | Admitting: Internal Medicine

## 2016-10-22 ENCOUNTER — Other Ambulatory Visit: Payer: Self-pay | Admitting: Internal Medicine

## 2016-10-28 ENCOUNTER — Ambulatory Visit (INDEPENDENT_AMBULATORY_CARE_PROVIDER_SITE_OTHER): Payer: Medicare Other | Admitting: Internal Medicine

## 2016-10-28 ENCOUNTER — Encounter: Payer: Self-pay | Admitting: Internal Medicine

## 2016-10-28 VITALS — BP 166/88 | HR 76 | Temp 98.4°F | Ht 60.0 in

## 2016-10-28 DIAGNOSIS — M159 Polyosteoarthritis, unspecified: Secondary | ICD-10-CM

## 2016-10-28 DIAGNOSIS — M150 Primary generalized (osteo)arthritis: Secondary | ICD-10-CM | POA: Diagnosis not present

## 2016-10-28 DIAGNOSIS — I10 Essential (primary) hypertension: Secondary | ICD-10-CM | POA: Diagnosis not present

## 2016-10-28 DIAGNOSIS — E1140 Type 2 diabetes mellitus with diabetic neuropathy, unspecified: Secondary | ICD-10-CM | POA: Diagnosis not present

## 2016-10-28 LAB — HEMOGLOBIN A1C: Hgb A1c MFr Bld: 8.2 % — ABNORMAL HIGH (ref 4.6–6.5)

## 2016-10-28 MED ORDER — METFORMIN HCL ER 500 MG PO TB24: ORAL_TABLET | ORAL | 3 refills | Status: DC

## 2016-10-28 MED ORDER — DIAZEPAM 2 MG PO TABS: ORAL_TABLET | ORAL | 0 refills | Status: DC

## 2016-10-28 MED ORDER — BUTALBITAL-APAP-CAFFEINE 50-325-40 MG PO TABS: 1.0000 | ORAL_TABLET | Freq: Every day | ORAL | 0 refills | Status: DC

## 2016-10-28 MED ORDER — PANTOPRAZOLE SODIUM 40 MG PO TBEC: 40.0000 mg | DELAYED_RELEASE_TABLET | Freq: Every morning | ORAL | 11 refills | Status: DC

## 2016-10-28 NOTE — Progress Notes (Signed)
Subjective:    Patient ID: Christie [name], female    DOB: May 14, 1921, 83 y.o.   MRN: [id number]  HPI 83 year old patient who is seen today in follow-up.  She has type 2 diabetes as well as essential hypertension.  Blood pressure of presently is controlled off medication. Last hemoglobin A1c was 6.0. She has history of osteoarthritis and chronic pain. She has occasional headaches. She is seen on a quarterly basis due to controlled medications.  She is accompanied by her daughter who administers medications  Past Medical History:  Diagnosis Date  . ANXIETY 01/31/2007  . DIABETES MELLITUS, TYPE II 01/31/2007  . Gastric ulcer   . Headache(784.0) 01/31/2007  . HYPERLIPIDEMIA 01/31/2007  . HYPERTENSION 01/31/2007  . HYPOKALEMIA, HX OF 02/09/2008  . OSTEOARTHRITIS 01/31/2007     Social History   Social History  . Marital status: Widowed    Spouse name: N/A  . Number of children: 4  . Years of education: N/A   Occupational History  . Retired    Social History Main Topics  . Smoking status: Former Smoker    Types: Cigarettes  . Smokeless tobacco: Never Used  . Alcohol use No  . Drug use: No  . Sexual activity: Not on file   Other Topics Concern  . Not on file   Social History Narrative  . No narrative on file    Past Surgical History:  Procedure Laterality Date  . ABDOMINAL HYSTERECTOMY    . CATARACT EXTRACTION Left   . ESOPHAGOGASTRODUODENOSCOPY N/A 07/02/2014   Procedure: ESOPHAGOGASTRODUODENOSCOPY (EGD);  Surgeon: Hilarie Fredrickson, MD;  Location: [facility];  Service: Endoscopy;  Laterality: N/A;    Family History  Problem Relation Age of Onset  . Breast cancer Sister   . Heart disease Neg Hx   . Esophageal cancer Neg Hx   . Lung cancer Sister   . Prostate cancer Brother   . Colon cancer Neg Hx   . Colon polyps Neg Hx   . Stomach cancer Neg Hx     Allergies  Allergen Reactions  . Codeine     Current Outpatient Prescriptions on File Prior to Visit  Medication Sig  Dispense Refill  . butalbital-acetaminophen-caffeine (FIORICET, ESGIC) 50-325-40 MG tablet TAKE 1 TABLET ONCE DAILY. 30 tablet 0  . diazepam (VALIUM) 2 MG tablet TAKE ONE TABLET EVERY 6 HOURS AS NEEDED FOR ANXIETY AND 1 TABLET AT BEDTIME AS DIRECTED. 30 tablet 0  . glucose blood test strip 1 each by Other route daily. 50 each 12  . LANCETS ULTRA FINE MISC 1 each by Does not apply route daily. 100 each 3  . metFORMIN (GLUCOPHAGE-XR) 500 MG 24 hr tablet TAKE 2 TABLETS DAILY AS DIRECTED. 180 tablet 3  . pantoprazole (PROTONIX) 40 MG tablet Take 1 tablet (40 mg total) by mouth every morning. 30 tablet 11   No current facility-administered medications on file prior to visit.     BP (!) 166/88 (BP Location: Left Arm, Patient Position: Sitting, Cuff Size: Normal)   Pulse 76   Temp 98.4 F (36.9 C) (Oral)   Ht 5' (1.524 m)   SpO2 97%    Review of Systems  Constitutional: Negative.   HENT: Negative for congestion, dental problem, hearing loss, rhinorrhea, sinus pressure, sore throat and tinnitus.   Eyes: Negative for pain, discharge and visual disturbance.  Respiratory: Negative for cough and shortness of breath.   Cardiovascular: Positive for leg swelling. Negative for chest pain and palpitations.  Gastrointestinal: Negative  for abdominal distention, abdominal pain, blood in stool, constipation, diarrhea, nausea and vomiting.  Genitourinary: Negative for difficulty urinating, dysuria, flank pain, frequency, hematuria, pelvic pain, urgency, vaginal bleeding, vaginal discharge and vaginal pain.  Musculoskeletal: Positive for arthralgias, back pain and gait problem. Negative for joint swelling.  Skin: Negative for rash.  Neurological: Negative for dizziness, syncope, speech difficulty, weakness, numbness and headaches.  Hematological: Negative for adenopathy.  Psychiatric/Behavioral: Negative for agitation, behavioral problems and dysphoric mood. The patient is not nervous/anxious.          Objective:   Physical Exam  Constitutional: She is oriented to person, place, and time. She appears well-developed and well-nourished. No distress.  Repeat blood pressure 140/70 Wheelchair bound  HENT:  Head: Normocephalic.  Right Ear: External ear normal.  Left Ear: External ear normal.  Mouth/Throat: Oropharynx is clear and moist.  A few scattered dentition remain  Eyes: Conjunctivae and EOM are normal. Pupils are equal, round, and reactive to light.  Neck: Normal range of motion. Neck supple. No thyromegaly present.  Cardiovascular: Normal rate, regular rhythm, normal heart sounds and intact distal pulses.   Pulmonary/Chest: Effort normal and breath sounds normal. She has no rales.  Abdominal: Soft. Bowel sounds are normal. She exhibits no mass. There is no tenderness.  Musculoskeletal: Normal range of motion. She exhibits edema.  Lymphadenopathy:    She has no cervical adenopathy.  Neurological: She is alert and oriented to person, place, and time.  Skin: Skin is warm and dry. No rash noted.  Psychiatric: She has a normal mood and affect. Her behavior is normal.          Assessment & Plan:   Diabetes mellitus.  Will check hemoglobin A1c.  This was well controlled last visit History of essential hypertension.  Initial blood pressure bit elevated but down to 140 over 70.  Will continue to monitor without medication Osteoarthritis   History of headaches  Medications updated  Christie [name]   Recheck 3 months

## 2016-10-28 NOTE — Progress Notes (Signed)
Pre visit review using our clinic review tool, if applicable. No additional management support is needed unless otherwise documented below in the visit note. 

## 2016-10-28 NOTE — Patient Instructions (Signed)
Limit your sodium (Salt) intake   Please check your hemoglobin A1c every 3-6 months   

## 2016-11-11 ENCOUNTER — Other Ambulatory Visit: Payer: Self-pay | Admitting: Internal Medicine

## 2016-11-13 ENCOUNTER — Telehealth: Payer: Self-pay | Admitting: Internal Medicine

## 2016-11-13 MED ORDER — DIAZEPAM 2 MG PO TABS: ORAL_TABLET | ORAL | 0 refills | Status: DC

## 2016-11-13 NOTE — Telephone Encounter (Signed)
° ° ° ° °  [phone]  Would like a call back on the above number

## 2016-11-13 NOTE — Telephone Encounter (Signed)
Pts daughter would like to have a call on a personal matter.

## 2016-11-13 NOTE — Telephone Encounter (Signed)
Pt's daughter wanted valium refilled for pt.

## 2016-11-16 NOTE — Telephone Encounter (Signed)
Left a message for Christie [name] to return my call.  Rx is available for pick up at the front desk.  Needs to be notified.

## 2016-11-16 NOTE — Telephone Encounter (Signed)
[name] aware rx ready for pick up.

## 2016-12-02 ENCOUNTER — Other Ambulatory Visit: Payer: Self-pay | Admitting: Internal Medicine

## 2016-12-04 ENCOUNTER — Other Ambulatory Visit: Payer: Self-pay | Admitting: Internal Medicine

## 2016-12-07 NOTE — Telephone Encounter (Signed)
Per Dr Kirtland BouchardK the Rx will be filled on her office app which is 01/29/2017

## 2016-12-07 NOTE — Telephone Encounter (Signed)
All her medications are filled at the time of her follow-up office visits

## 2017-01-29 ENCOUNTER — Encounter: Payer: Self-pay | Admitting: Internal Medicine

## 2017-01-29 ENCOUNTER — Ambulatory Visit (INDEPENDENT_AMBULATORY_CARE_PROVIDER_SITE_OTHER): Payer: Medicare Other | Admitting: Internal Medicine

## 2017-01-29 VITALS — BP 146/78 | HR 80 | Temp 98.6°F

## 2017-01-29 DIAGNOSIS — E1140 Type 2 diabetes mellitus with diabetic neuropathy, unspecified: Secondary | ICD-10-CM | POA: Diagnosis not present

## 2017-01-29 DIAGNOSIS — M150 Primary generalized (osteo)arthritis: Secondary | ICD-10-CM | POA: Diagnosis not present

## 2017-01-29 DIAGNOSIS — I10 Essential (primary) hypertension: Secondary | ICD-10-CM | POA: Diagnosis not present

## 2017-01-29 DIAGNOSIS — M159 Polyosteoarthritis, unspecified: Secondary | ICD-10-CM

## 2017-01-29 LAB — POCT GLYCOSYLATED HEMOGLOBIN (HGB A1C): HEMOGLOBIN A1C: 6.7

## 2017-01-29 MED ORDER — BUTALBITAL-APAP-CAFFEINE 50-325-40 MG PO TABS: 1.0000 | ORAL_TABLET | Freq: Every day | ORAL | 0 refills | Status: DC

## 2017-01-29 MED ORDER — DIAZEPAM 2 MG PO TABS: ORAL_TABLET | ORAL | 0 refills | Status: DC

## 2017-01-29 MED ORDER — METFORMIN HCL ER 500 MG PO TB24: ORAL_TABLET | ORAL | 3 refills | Status: DC

## 2017-01-29 NOTE — Progress Notes (Signed)
Subjective:    Patient ID: Christie [name], female    DOB: October 16, 1920, 83 y.o.   MRN: [id number]  HPI Lab Results  Component Value Date   HGBA1C 8.2 (H) 10/28/2016   83 year old patient who is seen today for follow-up of diabetes.  9 months ago.  Her hemoglobin A1c was 6.4 but increased to 8.23 months ago.  She is back on metformin but apparently taking 500 mg every morning only She generally feels well She has history of anxiety disorder as well as osteoarthritis. She has essential hypertension.  Presently controlled off drugs No cardiopulmonary complaints.  She is requesting refill on her medications  Past Medical History:  Diagnosis Date  . ANXIETY 01/31/2007  . DIABETES MELLITUS, TYPE II 01/31/2007  . Gastric ulcer   . Headache(784.0) 01/31/2007  . HYPERLIPIDEMIA 01/31/2007  . HYPERTENSION 01/31/2007  . HYPOKALEMIA, HX OF 02/09/2008  . OSTEOARTHRITIS 01/31/2007     Social History   Social History  . Marital status: Widowed    Spouse name: N/A  . Number of children: 4  . Years of education: N/A   Occupational History  . Retired    Social History Main Topics  . Smoking status: Former Smoker    Types: Cigarettes  . Smokeless tobacco: Never Used  . Alcohol use No  . Drug use: No  . Sexual activity: Not on file   Other Topics Concern  . Not on file   Social History Narrative  . No narrative on file    Past Surgical History:  Procedure Laterality Date  . ABDOMINAL HYSTERECTOMY    . CATARACT EXTRACTION Left   . ESOPHAGOGASTRODUODENOSCOPY N/A 07/02/2014   Procedure: ESOPHAGOGASTRODUODENOSCOPY (EGD);  Surgeon: Hilarie Fredrickson, MD;  Location: [facility];  Service: Endoscopy;  Laterality: N/A;    Family History  Problem Relation Age of Onset  . Breast cancer Sister   . Heart disease Neg Hx   . Esophageal cancer Neg Hx   . Lung cancer Sister   . Prostate cancer Brother   . Colon cancer Neg Hx   . Colon polyps Neg Hx   . Stomach cancer Neg Hx     Allergies  Allergen  Reactions  . Codeine     Current Outpatient Prescriptions on File Prior to Visit  Medication Sig Dispense Refill  . butalbital-acetaminophen-caffeine (FIORICET, ESGIC) 50-325-40 MG tablet TAKE 1 TABLET ONCE DAILY. 30 tablet 0  . diazepam (VALIUM) 2 MG tablet TAKE ONE TABLET EVERY 6 HOURS AS NEEDED FOR ANXIETY AND 1 TABLET AT BEDTIME AS DIRECTED. 90 tablet 0  . glucose blood test strip 1 each by Other route daily. 50 each 12  . LANCETS ULTRA FINE MISC 1 each by Does not apply route daily. 100 each 3  . metFORMIN (GLUCOPHAGE-XR) 500 MG 24 hr tablet TAKE 2 TABLETS DAILY AS DIRECTED. 180 tablet 3  . pantoprazole (PROTONIX) 40 MG tablet Take 1 tablet (40 mg total) by mouth every morning. 30 tablet 11   No current facility-administered medications on file prior to visit.     BP (!) 146/78 (BP Location: Left Arm, Patient Position: Sitting, Cuff Size: Normal)   Pulse 80   Temp 98.6 F (37 C) (Oral)   SpO2 98%      Review of Systems  Constitutional: Negative.   HENT: Negative for congestion, dental problem, hearing loss, rhinorrhea, sinus pressure, sore throat and tinnitus.   Eyes: Negative for pain, discharge and visual disturbance.  Respiratory: Negative for cough and shortness  of breath.   Cardiovascular: Positive for leg swelling. Negative for chest pain and palpitations.       Chronic pedal edema  Gastrointestinal: Negative for abdominal distention, abdominal pain, blood in stool, constipation, diarrhea, nausea and vomiting.  Genitourinary: Negative for difficulty urinating, dysuria, flank pain, frequency, hematuria, pelvic pain, urgency, vaginal bleeding, vaginal discharge and vaginal pain.  Musculoskeletal: Positive for arthralgias, back pain and gait problem. Negative for joint swelling.       Knee pain.  Uses a wheelchair  Skin: Negative for rash.  Neurological: Negative for dizziness, syncope, speech difficulty, weakness, numbness and headaches.  Hematological: Negative for  adenopathy.  Psychiatric/Behavioral: Negative for agitation, behavioral problems and dysphoric mood. The patient is not nervous/anxious.        Objective:   Physical Exam  Constitutional: She is oriented to person, place, and time. She appears well-developed and well-nourished.  Blood pressure 140/70  HENT:  Head: Normocephalic.  Right Ear: External ear normal.  Left Ear: External ear normal.  Mouth/Throat: Oropharynx is clear and moist.  A few scattered dentition remain  Eyes: Conjunctivae and EOM are normal. Pupils are equal, round, and reactive to light.  Neck: Normal range of motion. Neck supple. No thyromegaly present.  Cardiovascular: Normal rate, regular rhythm, normal heart sounds and intact distal pulses.   Pulmonary/Chest: Effort normal and breath sounds normal.  Abdominal: Soft. Bowel sounds are normal. She exhibits no mass. There is no tenderness.  Musculoskeletal: Normal range of motion. She exhibits edema.  Plus 2 pedal edema  Lymphadenopathy:    She has no cervical adenopathy.  Neurological: She is alert and oriented to person, place, and time.  Skin: Skin is warm and dry. No rash noted.  Psychiatric: She has a normal mood and affect. Her behavior is normal.          Assessment & Plan:   Essential hypertension Osteoarthritis with chronic back and knee pain.  Fioricet refilled Anxiety disorder.  Fioricet, and diazepam refilled Diabetes mellitus.  Will review hemoglobin A1c  Follow-up 3 months  Rogelia BogaKWIATKOWSKI,Shalise Rosado FRANK

## 2017-01-29 NOTE — Patient Instructions (Addendum)
Limit your sodium (Salt) intake   Please check your hemoglobin A1c every 3 months  Increase metformin to 4 tablets every morning with breakfast  Return in 3 months for follow-up

## 2017-02-23 ENCOUNTER — Other Ambulatory Visit: Payer: Self-pay | Admitting: Internal Medicine

## 2017-02-25 ENCOUNTER — Other Ambulatory Visit: Payer: Self-pay | Admitting: Internal Medicine

## 2017-04-15 ENCOUNTER — Encounter: Payer: Self-pay | Admitting: Internal Medicine

## 2017-05-05 ENCOUNTER — Ambulatory Visit: Payer: Medicare Other | Admitting: Internal Medicine

## 2017-05-12 ENCOUNTER — Encounter: Payer: Self-pay | Admitting: Internal Medicine

## 2017-05-12 ENCOUNTER — Ambulatory Visit (INDEPENDENT_AMBULATORY_CARE_PROVIDER_SITE_OTHER): Payer: Medicare Other | Admitting: Internal Medicine

## 2017-05-12 VITALS — BP 148/70 | HR 78 | Temp 97.7°F

## 2017-05-12 DIAGNOSIS — E1140 Type 2 diabetes mellitus with diabetic neuropathy, unspecified: Secondary | ICD-10-CM

## 2017-05-12 DIAGNOSIS — M150 Primary generalized (osteo)arthritis: Secondary | ICD-10-CM | POA: Diagnosis not present

## 2017-05-12 DIAGNOSIS — I10 Essential (primary) hypertension: Secondary | ICD-10-CM | POA: Diagnosis not present

## 2017-05-12 DIAGNOSIS — M159 Polyosteoarthritis, unspecified: Secondary | ICD-10-CM

## 2017-05-12 LAB — HEMOGLOBIN A1C: HEMOGLOBIN A1C: 6.9 % — AB (ref 4.6–6.5)

## 2017-05-12 MED ORDER — DIAZEPAM 2 MG PO TABS: ORAL_TABLET | ORAL | 0 refills | Status: DC

## 2017-05-12 MED ORDER — BUTALBITAL-APAP-CAFFEINE 50-325-40 MG PO TABS: 1.0000 | ORAL_TABLET | Freq: Every day | ORAL | 0 refills | Status: DC

## 2017-05-12 MED ORDER — PANTOPRAZOLE SODIUM 40 MG PO TBEC: 40.0000 mg | DELAYED_RELEASE_TABLET | Freq: Every morning | ORAL | 11 refills | Status: DC

## 2017-05-12 MED ORDER — METFORMIN HCL ER 500 MG PO TB24: ORAL_TABLET | ORAL | 3 refills | Status: DC

## 2017-05-12 NOTE — Progress Notes (Signed)
Subjective:    Patient ID: Christie [name], female    DOB: 1921/04/04, 83 y.o.   MRN: [id number]  HPI  83 year old patient who is seen today accompanied by her daughter.  She has a history of type 2 diabetes now controlled on metformin therapy. 6 months ago.  Hemoglobin A1c had increased to 3 months ago was in a nice range.  She generally feels well She has a history of essential hypertension. She has anxiety disorder, as well as osteoarthritis.  Requires all medications refilled  Past Medical History:  Diagnosis Date  . ANXIETY 01/31/2007  . DIABETES MELLITUS, TYPE II 01/31/2007  . Gastric ulcer   . Headache(784.0) 01/31/2007  . HYPERLIPIDEMIA 01/31/2007  . HYPERTENSION 01/31/2007  . HYPOKALEMIA, HX OF 02/09/2008  . OSTEOARTHRITIS 01/31/2007     Social History   Social History  . Marital status: Widowed    Spouse name: N/A  . Number of children: 4  . Years of education: N/A   Occupational History  . Retired    Social History Main Topics  . Smoking status: Former Smoker    Types: Cigarettes  . Smokeless tobacco: Never Used  . Alcohol use No  . Drug use: No  . Sexual activity: Not on file   Other Topics Concern  . Not on file   Social History Narrative  . No narrative on file    Past Surgical History:  Procedure Laterality Date  . ABDOMINAL HYSTERECTOMY    . CATARACT EXTRACTION Left   . ESOPHAGOGASTRODUODENOSCOPY N/A 07/02/2014   Procedure: ESOPHAGOGASTRODUODENOSCOPY (EGD);  Surgeon: Hilarie FredricksonJohn N Perry, MD;  Location: [facility];  Service: Endoscopy;  Laterality: N/A;    Family History  Problem Relation Age of Onset  . Breast cancer Sister   . Heart disease Neg Hx   . Esophageal cancer Neg Hx   . Lung cancer Sister   . Prostate cancer Brother   . Colon cancer Neg Hx   . Colon polyps Neg Hx   . Stomach cancer Neg Hx     Allergies  Allergen Reactions  . Codeine     Current Outpatient Prescriptions on File Prior to Visit  Medication Sig Dispense Refill  .  glucose blood test strip 1 each by Other route daily. 50 each 12  . LANCETS ULTRA FINE MISC 1 each by Does not apply route daily. 100 each 3   No current facility-administered medications on file prior to visit.     BP (!) 148/70 (BP Location: Left Arm, Patient Position: Sitting, Cuff Size: Normal)   Pulse 78   Temp 97.7 F (36.5 C) (Oral)   SpO2 98%     Review of Systems  Constitutional: Negative.   HENT: Negative for congestion, dental problem, hearing loss, rhinorrhea, sinus pressure, sore throat and tinnitus.   Eyes: Negative for pain, discharge and visual disturbance.  Respiratory: Negative for cough and shortness of breath.   Cardiovascular: Negative for chest pain, palpitations and leg swelling.  Gastrointestinal: Negative for abdominal distention, abdominal pain, blood in stool, constipation, diarrhea, nausea and vomiting.  Genitourinary: Negative for difficulty urinating, dysuria, flank pain, frequency, hematuria, pelvic pain, urgency, vaginal bleeding, vaginal discharge and vaginal pain.  Musculoskeletal: Positive for arthralgias, back pain and gait problem. Negative for joint swelling.  Skin: Negative for rash.  Neurological: Negative for dizziness, syncope, speech difficulty, weakness, numbness and headaches.  Hematological: Negative for adenopathy.  Psychiatric/Behavioral: Positive for sleep disturbance. Negative for agitation, behavioral problems and dysphoric mood. The patient is  nervous/anxious.        Objective:   Physical Exam  Constitutional: She is oriented to person, place, and time. She appears well-developed and well-nourished.  Alert No distress  Blood pressure well controlled.  Wheelchair bound  HENT:  Head: Normocephalic.  Right Ear: External ear normal.  Left Ear: External ear normal.  Mouth/Throat: Oropharynx is clear and moist.  Eyes: Pupils are equal, round, and reactive to light. Conjunctivae and EOM are normal.  Neck: Normal range of motion.  Neck supple. No thyromegaly present.  Cardiovascular: Normal rate, regular rhythm and normal heart sounds.   Pulmonary/Chest: Effort normal and breath sounds normal.  Abdominal: Soft. Bowel sounds are normal. She exhibits no mass. There is no tenderness.  Musculoskeletal: Normal range of motion. She exhibits edema.  Lymphadenopathy:    She has no cervical adenopathy.  Neurological: She is alert and oriented to person, place, and time.  Skin: Skin is warm and dry. No rash noted.  Psychiatric: She has a normal mood and affect. Her behavior is normal.          Assessment & Plan:   Diabetes mellitus.  Will review a hemoglobin A1c Essential hypertension.  Stable.  We'll continue to observe off medications Anxiety disorder.  Medications updated History of gastric ulcer.  Protonix refilled  Flu vaccine, declined Follow-up 6 months  KWIATKOWSKI,PETER Homero Fellers

## 2017-05-12 NOTE — Patient Instructions (Signed)
Limit your sodium (Salt) intake  Avoids foods high in acid such as tomatoes citrus juices, and spicy foods.  Avoid eating within two hours of lying down or before exercising.  Do not overheat.  Try smaller more frequent meals.  I   Please check your hemoglobin A1c every 3-6  months

## 2017-11-11 ENCOUNTER — Encounter: Payer: Self-pay | Admitting: Internal Medicine

## 2017-11-11 ENCOUNTER — Ambulatory Visit (INDEPENDENT_AMBULATORY_CARE_PROVIDER_SITE_OTHER): Payer: Medicare Other | Admitting: Internal Medicine

## 2017-11-11 VITALS — BP 188/86 | HR 80 | Temp 98.4°F

## 2017-11-11 DIAGNOSIS — M150 Primary generalized (osteo)arthritis: Secondary | ICD-10-CM

## 2017-11-11 DIAGNOSIS — E1140 Type 2 diabetes mellitus with diabetic neuropathy, unspecified: Secondary | ICD-10-CM | POA: Diagnosis not present

## 2017-11-11 DIAGNOSIS — E785 Hyperlipidemia, unspecified: Secondary | ICD-10-CM

## 2017-11-11 DIAGNOSIS — I10 Essential (primary) hypertension: Secondary | ICD-10-CM | POA: Diagnosis not present

## 2017-11-11 DIAGNOSIS — M159 Polyosteoarthritis, unspecified: Secondary | ICD-10-CM

## 2017-11-11 LAB — CBC WITH DIFFERENTIAL/PLATELET
BASOS PCT: 0.5 % (ref 0.0–3.0)
Basophils Absolute: 0 10*3/uL (ref 0.0–0.1)
EOS PCT: 0.4 % (ref 0.0–5.0)
Eosinophils Absolute: 0 10*3/uL (ref 0.0–0.7)
HEMATOCRIT: 35.5 % — AB (ref 36.0–46.0)
HEMOGLOBIN: 11.8 g/dL — AB (ref 12.0–15.0)
LYMPHS PCT: 28.9 % (ref 12.0–46.0)
Lymphs Abs: 1.5 10*3/uL (ref 0.7–4.0)
MCHC: 33.2 g/dL (ref 30.0–36.0)
MCV: 85.8 fl (ref 78.0–100.0)
Monocytes Absolute: 0.6 10*3/uL (ref 0.1–1.0)
Monocytes Relative: 11.1 % (ref 3.0–12.0)
Neutro Abs: 3 10*3/uL (ref 1.4–7.7)
Neutrophils Relative %: 59.1 % (ref 43.0–77.0)
Platelets: 192 10*3/uL (ref 150.0–400.0)
RBC: 4.14 Mil/uL (ref 3.87–5.11)
RDW: 14.8 % (ref 11.5–15.5)
WBC: 5.1 10*3/uL (ref 4.0–10.5)

## 2017-11-11 LAB — COMPREHENSIVE METABOLIC PANEL
ALK PHOS: 54 U/L (ref 39–117)
ALT: 7 U/L (ref 0–35)
AST: 11 U/L (ref 0–37)
Albumin: 3.9 g/dL (ref 3.5–5.2)
BUN: 15 mg/dL (ref 6–23)
CALCIUM: 9 mg/dL (ref 8.4–10.5)
CHLORIDE: 105 meq/L (ref 96–112)
CO2: 25 mEq/L (ref 19–32)
Creatinine, Ser: 0.81 mg/dL (ref 0.40–1.20)
GFR: 84.57 mL/min (ref >60.00)
Glucose, Bld: 184 mg/dL — ABNORMAL HIGH (ref 70–99)
POTASSIUM: 3.9 meq/L (ref 3.5–5.1)
Sodium: 140 mEq/L (ref 135–145)
TOTAL PROTEIN: 6.6 g/dL (ref 6.0–8.3)
Total Bilirubin: 0.4 mg/dL (ref 0.2–1.2)

## 2017-11-11 LAB — LIPID PANEL
CHOLESTEROL: 201 mg/dL — AB (ref 0–200)
HDL: 47.7 mg/dL (ref >39.00)
LDL CALC: 124 mg/dL — AB (ref 0–99)
NonHDL: 153.76
TRIGLYCERIDES: 151 mg/dL — AB (ref 0.0–149.0)
Total CHOL/HDL Ratio: 4
VLDL: 30.2 mg/dL (ref 0.0–40.0)

## 2017-11-11 LAB — POCT GLYCOSYLATED HEMOGLOBIN (HGB A1C): HEMOGLOBIN A1C: 6.7

## 2017-11-11 LAB — TSH: TSH: 1.81 u[IU]/mL (ref 0.35–4.50)

## 2017-11-11 MED ORDER — BUTALBITAL-APAP-CAFFEINE 50-325-40 MG PO TABS: 1.0000 | ORAL_TABLET | Freq: Every day | ORAL | 0 refills | Status: DC

## 2017-11-11 MED ORDER — DIAZEPAM 2 MG PO TABS: ORAL_TABLET | ORAL | 0 refills | Status: DC

## 2017-11-11 NOTE — Progress Notes (Signed)
Subjective:    Patient ID: Christie [name], female    DOB: 1921/05/15, 84 y.o.   MRN: [id number]  HPI  84 year old patient who is seen today for her 5524-month follow-up.  She has type 2 diabetes which has been controlled with metformin therapy. Hemoglobin A1c today remains under acceptable control She has essential hypertension.  Blood pressure on arrival elevated but repeat blood pressure 130/78 She has significant osteoarthritis.  Accompanied by her daughter today  Past Medical History:  Diagnosis Date  . ANXIETY 01/31/2007  . DIABETES MELLITUS, TYPE II 01/31/2007  . Gastric ulcer   . Headache(784.0) 01/31/2007  . HYPERLIPIDEMIA 01/31/2007  . HYPERTENSION 01/31/2007  . HYPOKALEMIA, HX OF 02/09/2008  . OSTEOARTHRITIS 01/31/2007     Social History   Socioeconomic History  . Marital status: Widowed    Spouse name: Not on file  . Number of children: 4  . Years of education: Not on file  . Highest education level: Not on file  Occupational History  . Occupation: Retired  Engineer, productionocial Needs  . Financial resource strain: Not on file  . Food insecurity:    Worry: Not on file    Inability: Not on file  . Transportation needs:    Medical: Not on file    Non-medical: Not on file  Tobacco Use  . Smoking status: Former Smoker    Types: Cigarettes  . Smokeless tobacco: Never Used  Substance and Sexual Activity  . Alcohol use: No    Alcohol/week: 0.0 oz  . Drug use: No  . Sexual activity: Not on file  Lifestyle  . Physical activity:    Days per week: Not on file    Minutes per session: Not on file  . Stress: Not on file  Relationships  . Social connections:    Talks on phone: Not on file    Gets together: Not on file    Attends religious service: Not on file    Active member of club or organization: Not on file    Attends meetings of clubs or organizations: Not on file    Relationship status: Not on file  . Intimate partner violence:    Fear of current or ex partner: Not on file   Emotionally abused: Not on file    Physically abused: Not on file    Forced sexual activity: Not on file  Other Topics Concern  . Not on file  Social History Narrative  . Not on file    Past Surgical History:  Procedure Laterality Date  . ABDOMINAL HYSTERECTOMY    . CATARACT EXTRACTION Left   . ESOPHAGOGASTRODUODENOSCOPY N/A 07/02/2014   Procedure: ESOPHAGOGASTRODUODENOSCOPY (EGD);  Surgeon: Hilarie FredricksonJohn N Perry, MD;  Location: [facility];  Service: Endoscopy;  Laterality: N/A;    Family History  Problem Relation Age of Onset  . Breast cancer Sister   . Heart disease Neg Hx   . Esophageal cancer Neg Hx   . Lung cancer Sister   . Prostate cancer Brother   . Colon cancer Neg Hx   . Colon polyps Neg Hx   . Stomach cancer Neg Hx     Allergies  Allergen Reactions  . Codeine     Current Outpatient Medications on File Prior to Visit  Medication Sig Dispense Refill  . glucose blood test strip 1 each by Other route daily. 50 each 12  . LANCETS ULTRA FINE MISC 1 each by Does not apply route daily. 100 each 3  . metFORMIN (GLUCOPHAGE-XR) 500  MG 24 hr tablet TAKE 4  TABLETS DAILY AS DIRECTED. 360 tablet 3  . pantoprazole (PROTONIX) 40 MG tablet Take 1 tablet (40 mg total) by mouth every morning. 30 tablet 11   No current facility-administered medications on file prior to visit.     BP (!) 188/86 (BP Location: Left Arm, Patient Position: Sitting)   Pulse 80   Temp 98.4 F (36.9 C) (Oral)   SpO2 99%     Review of Systems  Constitutional: Negative.   HENT: Negative for congestion, dental problem, hearing loss, rhinorrhea, sinus pressure, sore throat and tinnitus.   Eyes: Negative for pain, discharge and visual disturbance.  Respiratory: Negative for cough and shortness of breath.   Cardiovascular: Positive for leg swelling. Negative for chest pain and palpitations.  Gastrointestinal: Negative for abdominal distention, abdominal pain, blood in stool, constipation, diarrhea, nausea  and vomiting.  Genitourinary: Negative for difficulty urinating, dysuria, flank pain, frequency, hematuria, pelvic pain, urgency, vaginal bleeding, vaginal discharge and vaginal pain.  Musculoskeletal: Positive for arthralgias, back pain and gait problem. Negative for joint swelling.  Skin: Negative for rash.  Neurological: Negative for dizziness, syncope, speech difficulty, weakness, numbness and headaches.  Hematological: Negative for adenopathy.  Psychiatric/Behavioral: Negative for agitation, behavioral problems and dysphoric mood. The patient is not nervous/anxious.        Objective:   Physical Exam  Constitutional: She is oriented to person, place, and time. She appears well-developed and well-nourished.  Repeat blood pressure 130/78  HENT:  Head: Normocephalic.  Right Ear: External ear normal.  Left Ear: External ear normal.  Mouth/Throat: Oropharynx is clear and moist.  Eyes: Pupils are equal, round, and reactive to light. Conjunctivae and EOM are normal.  Neck: Normal range of motion. Neck supple. No thyromegaly present.  Cardiovascular: Normal rate, regular rhythm, normal heart sounds and intact distal pulses.  Pulmonary/Chest: Effort normal and breath sounds normal.  Abdominal: Soft. Bowel sounds are normal. She exhibits no mass. There is no tenderness.  Musculoskeletal: Normal range of motion. She exhibits edema.  Lower extremity edema right greater than left.  Stable and unchanged  Lymphadenopathy:    She has no cervical adenopathy.  Neurological: She is alert and oriented to person, place, and time.  Skin: Skin is warm and dry. No rash noted.  Psychiatric: She has a normal mood and affect. Her behavior is normal.          Assessment & Plan:   Diabetes mellitus.  Hemoglobin A1c acceptable.  No change in medical regimen Essential hypertension Osteoarthritis  Medications updated Will check updated lab including lipid profile hemoglobin A1c renal indices and  urine for microalbumin  Follow-up 3 to 6 months  Christie [name]

## 2017-11-11 NOTE — Patient Instructions (Signed)
Limit your sodium (Salt) intake   Please check your hemoglobin A1c every 3-6  months  Please check your blood pressure on a regular basis.  If it is consistently greater than 150/90, please make an office appointment.    

## 2018-02-09 ENCOUNTER — Ambulatory Visit (INDEPENDENT_AMBULATORY_CARE_PROVIDER_SITE_OTHER): Payer: Medicare Other | Admitting: Internal Medicine

## 2018-02-09 ENCOUNTER — Encounter: Payer: Self-pay | Admitting: Internal Medicine

## 2018-02-09 VITALS — BP 140/58 | HR 84 | Temp 99.1°F

## 2018-02-09 DIAGNOSIS — M159 Polyosteoarthritis, unspecified: Secondary | ICD-10-CM

## 2018-02-09 DIAGNOSIS — E1140 Type 2 diabetes mellitus with diabetic neuropathy, unspecified: Secondary | ICD-10-CM

## 2018-02-09 DIAGNOSIS — I10 Essential (primary) hypertension: Secondary | ICD-10-CM

## 2018-02-09 DIAGNOSIS — M150 Primary generalized (osteo)arthritis: Secondary | ICD-10-CM | POA: Diagnosis not present

## 2018-02-09 LAB — POCT GLYCOSYLATED HEMOGLOBIN (HGB A1C): HEMOGLOBIN A1C: 6.4 % — AB (ref 4.0–5.6)

## 2018-02-09 MED ORDER — DIAZEPAM 2 MG PO TABS: ORAL_TABLET | ORAL | 0 refills | Status: DC

## 2018-02-09 MED ORDER — BUTALBITAL-APAP-CAFFEINE 50-325-40 MG PO TABS: 1.0000 | ORAL_TABLET | Freq: Every day | ORAL | 0 refills | Status: DC

## 2018-02-09 NOTE — Progress Notes (Signed)
Subjective:    Patient ID: Christie [name], female    DOB: [birth year], 84 y.o.   MRN: [id number]  HPI  84 year old patient who is seen today for follow-up. She is a coming by her daughter.  She does have history of hypertension type 2 diabetes as well as generalized osteoarthritis. She basically is wheelchair bound except for short distances  Lab Results  Component Value Date   HGBA1C 6.4 (A) 02/09/2018    Past Medical History:  Diagnosis Date  . ANXIETY 01/31/2007  . DIABETES MELLITUS, TYPE II 01/31/2007  . Gastric ulcer   . Headache(784.0) 01/31/2007  . HYPERLIPIDEMIA 01/31/2007  . HYPERTENSION 01/31/2007  . HYPOKALEMIA, HX OF 02/09/2008  . OSTEOARTHRITIS 01/31/2007     Social History   Socioeconomic History  . Marital status: Widowed    Spouse name: Not on file  . Number of children: 4  . Years of education: Not on file  . Highest education level: Not on file  Occupational History  . Occupation: Retired  Engineer, production  . Financial resource strain: Not on file  . Food insecurity:    Worry: Not on file    Inability: Not on file  . Transportation needs:    Medical: Not on file    Non-medical: Not on file  Tobacco Use  . Smoking status: Former Smoker    Types: Cigarettes  . Smokeless tobacco: Never Used  Substance and Sexual Activity  . Alcohol use: No    Alcohol/week: 0.0 oz  . Drug use: No  . Sexual activity: Not on file  Lifestyle  . Physical activity:    Days per week: Not on file    Minutes per session: Not on file  . Stress: Not on file  Relationships  . Social connections:    Talks on phone: Not on file    Gets together: Not on file    Attends religious service: Not on file    Active member of club or organization: Not on file    Attends meetings of clubs or organizations: Not on file    Relationship status: Not on file  . Intimate partner violence:    Fear of current or ex partner: Not on file    Emotionally abused: Not on file    Physically abused: Not  on file    Forced sexual activity: Not on file  Other Topics Concern  . Not on file  Social History Narrative  . Not on file    Past Surgical History:  Procedure Laterality Date  . ABDOMINAL HYSTERECTOMY    . CATARACT EXTRACTION Left   . ESOPHAGOGASTRODUODENOSCOPY N/A 07/02/2014   Procedure: ESOPHAGOGASTRODUODENOSCOPY (EGD);  Surgeon: Hilarie Fredrickson, MD;  Location: [facility];  Service: Endoscopy;  Laterality: N/A;    Family History  Problem Relation Age of Onset  . Breast cancer Sister   . Heart disease Neg Hx   . Esophageal cancer Neg Hx   . Lung cancer Sister   . Prostate cancer Brother   . Colon cancer Neg Hx   . Colon polyps Neg Hx   . Stomach cancer Neg Hx     Allergies  Allergen Reactions  . Codeine     Current Outpatient Medications on File Prior to Visit  Medication Sig Dispense Refill  . glucose blood test strip 1 each by Other route daily. 50 each 12  . LANCETS ULTRA FINE MISC 1 each by Does not apply route daily. 100 each 3  . metFORMIN (GLUCOPHAGE-XR)  500 MG 24 hr tablet TAKE 4  TABLETS DAILY AS DIRECTED. 360 tablet 3  . pantoprazole (PROTONIX) 40 MG tablet Take 1 tablet (40 mg total) by mouth every morning. 30 tablet 11   No current facility-administered medications on file prior to visit.     BP (!) 140/58 (BP Location: Right Arm, Patient Position: Sitting, Cuff Size: Large)   Pulse 84   Temp 99.1 F (37.3 C) (Oral)   SpO2 99%     Review of Systems  Constitutional: Negative.   HENT: Negative for congestion, dental problem, hearing loss, rhinorrhea, sinus pressure, sore throat and tinnitus.   Eyes: Negative for pain, discharge and visual disturbance.  Respiratory: Negative for cough and shortness of breath.   Cardiovascular: Negative for chest pain, palpitations and leg swelling.  Gastrointestinal: Negative for abdominal distention, abdominal pain, blood in stool, constipation, diarrhea, nausea and vomiting.  Genitourinary: Negative for  difficulty urinating, dysuria, flank pain, frequency, hematuria, pelvic pain, urgency, vaginal bleeding, vaginal discharge and vaginal pain.  Musculoskeletal: Positive for arthralgias, back pain and gait problem. Negative for joint swelling.  Skin: Negative for rash.  Neurological: Negative for dizziness, syncope, speech difficulty, weakness, numbness and headaches.  Hematological: Negative for adenopathy.  Psychiatric/Behavioral: Negative for agitation, behavioral problems and dysphoric mood. The patient is not nervous/anxious.        Objective:   Physical Exam  Constitutional: She is oriented to person, place, and time. She appears well-developed and well-nourished.  Wheelchair-bound Alert Blood pressure 130/60  HENT:  Head: Normocephalic.  Right Ear: External ear normal.  Left Ear: External ear normal.  Mouth/Throat: Oropharynx is clear and moist.  Eyes: Pupils are equal, round, and reactive to light. Conjunctivae and EOM are normal.  Neck: Normal range of motion. Neck supple. No thyromegaly present.  Cardiovascular: Normal rate, regular rhythm, normal heart sounds and intact distal pulses.  Pulmonary/Chest: Effort normal and breath sounds normal.  Abdominal: Soft. Bowel sounds are normal. She exhibits no mass. There is no tenderness.  Musculoskeletal: Normal range of motion. She exhibits edema.  +2 pedal edema  Lymphadenopathy:    She has no cervical adenopathy.  Neurological: She is alert and oriented to person, place, and time.  Skin: Skin is warm and dry. No rash noted.  Psychiatric: She has a normal mood and affect. Her behavior is normal.          Assessment & Plan:   Essential hypertension.  Well-controlled Type 2 diabetes.  Continue metformin therapy.  Hemoglobin A1c well controlled Osteoarthritis.  Patient has history of gastric ulcer.  Continue Tylenol Anxiety disorder.  Diazepam refilled.  Her daughter dispenses medication and is limited to 1  daily  Follow-up 4 to 6 months with new provider  Gordy SaversPeter F Valkyrie Guardiola

## 2018-02-09 NOTE — Patient Instructions (Signed)
Limit your sodium (Salt) intake   Please check your hemoglobin A1c every 3 months   

## 2018-02-10 ENCOUNTER — Ambulatory Visit: Payer: Medicare Other | Admitting: Internal Medicine

## 2018-06-03 ENCOUNTER — Other Ambulatory Visit: Payer: Self-pay | Admitting: Internal Medicine

## 2018-06-09 ENCOUNTER — Other Ambulatory Visit: Payer: Self-pay

## 2018-06-13 NOTE — Progress Notes (Signed)
Established Patient Office Visit     CC/Reason for Visit: To establish care with me in for follow-up on chronic medical conditions  HPI: Christie [name] is a 84 y.o. female who is coming in today for above mentioned reasons.  She is here with her daughter Christie [name] today.  Past Medical History is significant for: Type 2 diabetes, osteoarthritis, gastric ulcer, essential hypertension, hyperlipidemia as well as mild anxiety.  She has no acute complaints today and is here essentially to meet me as her new provider and for medication refills.  Her blood pressure remains well controlled, she takes all medications as prescribed.  I have inquired about her headaches, she tells me she never gets any headaches, when asked why she takes Fioricet on a daily basis she tells me she does it for her arthritis.  Regards to her diabetes she does not take her blood sugars at home, her daughter is very strict with meal preparation.  We have discussed her diet log and she seems to be eating appropriately.  She is due for an A1c today.  She is due for her annual physical in April 2020.   Past Medical/Surgical History: Past Medical History:  Diagnosis Date  . ANXIETY 01/31/2007  . DIABETES MELLITUS, TYPE II 01/31/2007  . Gastric ulcer   . Headache(784.0) 01/31/2007  . HYPERLIPIDEMIA 01/31/2007  . HYPERTENSION 01/31/2007  . HYPOKALEMIA, HX OF 02/09/2008  . OSTEOARTHRITIS 01/31/2007    Past Surgical History:  Procedure Laterality Date  . ABDOMINAL HYSTERECTOMY    . CATARACT EXTRACTION Left   . ESOPHAGOGASTRODUODENOSCOPY N/A 07/02/2014   Procedure: ESOPHAGOGASTRODUODENOSCOPY (EGD);  Surgeon: Hilarie FredricksonJohn N Perry, MD;  Location: [facility];  Service: Endoscopy;  Laterality: N/A;    Social History:  reports that she has quit smoking. Her smoking use included cigarettes. She has never used smokeless tobacco. She reports that she does not drink alcohol or use drugs.  Allergies: Allergies  Allergen Reactions  . Codeine      Family History:  Family History  Problem Relation Age of Onset  . Breast cancer Sister   . Heart disease Neg Hx   . Esophageal cancer Neg Hx   . Lung cancer Sister   . Prostate cancer Brother   . Colon cancer Neg Hx   . Colon polyps Neg Hx   . Stomach cancer Neg Hx      Current Outpatient Medications:  .  butalbital-acetaminophen-caffeine (FIORICET, ESGIC) 50-325-40 MG tablet, Take 1 tablet by mouth every other day., Disp: 45 tablet, Rfl: 0 .  diazepam (VALIUM) 2 MG tablet, 1 tablet daily as needed for anxiety or sleep, Disp: 90 tablet, Rfl: 0 .  glucose blood test strip, 1 each by Other route daily., Disp: 50 each, Rfl: 12 .  LANCETS ULTRA FINE MISC, 1 each by Does not apply route daily., Disp: 100 each, Rfl: 3 .  metFORMIN (GLUCOPHAGE-XR) 500 MG 24 hr tablet, TAKE 4  TABLETS DAILY AS DIRECTED., Disp: 360 tablet, Rfl: 1 .  pantoprazole (PROTONIX) 40 MG tablet, Take 1 tablet (40 mg total) by mouth every morning., Disp: 90 tablet, Rfl: 1  Review of Systems:  Constitutional: Denies fever, chills, diaphoresis, appetite change and fatigue.  HEENT: Denies photophobia, eye pain, redness, hearing loss, ear pain, congestion, sore throat, rhinorrhea, sneezing, mouth sores, trouble swallowing, neck pain, neck stiffness and tinnitus.   Respiratory: Denies SOB, DOE, cough, chest tightness,  and wheezing.   Cardiovascular: Denies chest pain, palpitations and leg swelling.  Gastrointestinal:  Denies nausea, vomiting, abdominal pain, diarrhea, constipation, blood in stool and abdominal distention.  Genitourinary: Denies dysuria, urgency, frequency, hematuria, flank pain and difficulty urinating.  Endocrine: Denies: hot or cold intolerance, sweats, changes in hair or nails, polyuria, polydipsia. Musculoskeletal: Denies myalgias, back pain, joint swelling, arthralgias and gait problem.  Skin: Denies pallor, rash and wound.  Neurological: Denies dizziness, seizures, syncope, weakness,  light-headedness, numbness and headaches.  Hematological: Denies adenopathy. Easy bruising, personal or family bleeding history  Psychiatric/Behavioral: Denies suicidal ideation, mood changes, confusion, nervousness, sleep disturbance and agitation    Physical Exam: Vitals:   06/14/18 0837  BP: (!) 130/50  Pulse: 75  Temp: 97.7 F (36.5 C)  TempSrc: Oral  SpO2: 97%    There is no height or weight on file to calculate BMI.   Constitutional: NAD, calm, comfortable Eyes: PERRL, lids and conjunctivae normal ENMT: Mucous membranes are moist. Posterior pharynx clear of any exudate or lesions. Poor dentition.  Neck: normal, supple, no masses, no thyromegaly Respiratory: clear to auscultation bilaterally, no wheezing, no crackles. Normal respiratory effort. No accessory muscle use.  Cardiovascular: Regular rate and rhythm, no murmurs / rubs / gallops. No extremity edema. 2+ pedal pulses. No carotid bruits.  Abdomen: no tenderness, no masses palpated. No hepatosplenomegaly. Bowel sounds positive.  Musculoskeletal: no clubbing / cyanosis. No joint deformity upper and lower extremities. Good ROM, no contractures. Normal muscle tone.  Skin: no rashes, lesions, ulcers. No induration Neurologic: CN 2-12 grossly intact. Sensation intact, DTR normal. Strength 5/5 in all 4.  Psychiatric: Normal judgment and insight. Alert and oriented x 3. Normal mood.    Impression and Plan:  Dyslipidemia -LDL 124 in April 2019. -Not on medications. -Role of statin at her advanced age is not entirely clear.  Believe it is okay to not prescribe at this time.  Essential hypertension -Blood pressure is well controlled today. -This is controlled with lifestyle modifications only, she does not take any medications.  Primary osteoarthritis involving multiple joints -Remains at baseline. -Discussed Fioricet use with her.  I have explained to her and her daughter that this is primarily medication for headaches.   Patient has no history of headaches but takes Fioricet every day.  Have decided to start weaning her off of this, for now we will do every other day until next appointment.  She has been advised to call us if she has any issues.  Anxiety state -Have refilled her nighttime Valium that she has been taking for many years.  Chronic gastric ulcer without hemorrhage and without perforation -Protonix refilled today.  Controlled type 2 diabetes with neuropathy (HCC) -She does not routinely check CBGs at home. -Is on metformin. -Due for A1c today.    Patient Instructions  BEFORE YOU LEAVE: -labs  -Follow up: In April for your annual physical.  Please come fasting to this visit.  Please call and if you have any issues before that.  Please take the Fioricet every other day instead of daily.  This medication should be used for headaches and not for generalized pain.  We have ordered labs or studies at this visit. It can take up to 1-2 weeks for results and processing. IF results require follow up or explanation, we will call you with instructions. Clinically stable results will be released to your [facility]. If you have not heard from [place] or cannot find your results in [facility] in 2 weeks please contact our office at [phone].  If you are not yet signed up for  MYCHART, please consider signing up.        Chaya Jan, MD [place]

## 2018-06-14 ENCOUNTER — Ambulatory Visit (INDEPENDENT_AMBULATORY_CARE_PROVIDER_SITE_OTHER): Payer: Medicare Other | Admitting: Internal Medicine

## 2018-06-14 ENCOUNTER — Other Ambulatory Visit: Payer: Medicare Other

## 2018-06-14 ENCOUNTER — Encounter: Payer: Self-pay | Admitting: Internal Medicine

## 2018-06-14 VITALS — BP 130/50 | HR 75 | Temp 97.7°F

## 2018-06-14 DIAGNOSIS — E1140 Type 2 diabetes mellitus with diabetic neuropathy, unspecified: Secondary | ICD-10-CM | POA: Diagnosis not present

## 2018-06-14 DIAGNOSIS — F411 Generalized anxiety disorder: Secondary | ICD-10-CM

## 2018-06-14 DIAGNOSIS — E785 Hyperlipidemia, unspecified: Secondary | ICD-10-CM | POA: Diagnosis not present

## 2018-06-14 DIAGNOSIS — K257 Chronic gastric ulcer without hemorrhage or perforation: Secondary | ICD-10-CM

## 2018-06-14 DIAGNOSIS — M159 Polyosteoarthritis, unspecified: Secondary | ICD-10-CM

## 2018-06-14 DIAGNOSIS — I10 Essential (primary) hypertension: Secondary | ICD-10-CM

## 2018-06-14 DIAGNOSIS — M150 Primary generalized (osteo)arthritis: Secondary | ICD-10-CM

## 2018-06-14 LAB — HEMOGLOBIN A1C: HEMOGLOBIN A1C: 6.9 % — AB (ref 4.6–6.5)

## 2018-06-14 MED ORDER — PANTOPRAZOLE SODIUM 40 MG PO TBEC: 40.0000 mg | DELAYED_RELEASE_TABLET | Freq: Every morning | ORAL | 1 refills | Status: DC

## 2018-06-14 MED ORDER — METFORMIN HCL ER 500 MG PO TB24: ORAL_TABLET | ORAL | 1 refills | Status: DC

## 2018-06-14 MED ORDER — BUTALBITAL-APAP-CAFFEINE 50-325-40 MG PO TABS: 1.0000 | ORAL_TABLET | ORAL | 0 refills | Status: DC

## 2018-06-14 MED ORDER — DIAZEPAM 2 MG PO TABS: ORAL_TABLET | ORAL | 0 refills | Status: DC

## 2018-06-14 NOTE — Patient Instructions (Addendum)
BEFORE YOU LEAVE: -labs  -Follow up: In April for your annual physical.  Please come fasting to this visit.  Please call and if you have any issues before that.  Please take the Fioricet every other day instead of daily.  This medication should be used for headaches and not for generalized pain.  We have ordered labs or studies at this visit. It can take up to 1-2 weeks for results and processing. IF results require follow up or explanation, we will call you with instructions. Clinically stable results will be released to your [facility]. If you have not heard from us or cannot find your results in [facility] in 2 weeks please contact our office at [phone].  If you are not yet signed up for [facility], please consider signing up.

## 2018-08-18 ENCOUNTER — Other Ambulatory Visit: Payer: Self-pay | Admitting: *Deleted

## 2018-08-18 DIAGNOSIS — E1140 Type 2 diabetes mellitus with diabetic neuropathy, unspecified: Secondary | ICD-10-CM

## 2018-08-18 MED ORDER — METFORMIN HCL ER 500 MG PO TB24: ORAL_TABLET | ORAL | 1 refills | Status: DC

## 2018-08-24 ENCOUNTER — Telehealth: Payer: Self-pay | Admitting: Internal Medicine

## 2018-08-24 DIAGNOSIS — E1140 Type 2 diabetes mellitus with diabetic neuropathy, unspecified: Secondary | ICD-10-CM

## 2018-08-24 NOTE — Telephone Encounter (Signed)
Copied from CRM [id number]. Topic: Quick Communication - Rx Refill/Question >> Aug 24, 2018  1:02 PM Wyonia Hough E wrote: Medication: ***metFORMIN (GLUCOPHAGE-XR) 500 MG 24 hr tablet   This has a direction to take 4 daily but Pt advised pharmacy that she only takes 2 a day/ pharmacy called to clarify the amount/ please advise    Preferred Pharmacy (with phone number or street name):[facility] - [place], [place] - [address] [phone] (Phone) [phone] (Fax)

## 2018-08-24 NOTE — Telephone Encounter (Signed)
Copied from CRM #[id number]. Topic: Quick Communication - Rx Refill/Question °>> Aug 24, 2018  1:02 PM Johnson, Chaz E wrote: °Medication: ***metFORMIN (GLUCOPHAGE-XR) 500 MG 24 hr tablet  ° °This has a direction to take 4 daily but Pt advised pharmacy that she only takes 2 a day/ pharmacy called to clarify the amount/ please advise  ° ° °Preferred Pharmacy (with phone number or street name):[facility] - [place], [place] - [address] [phone] (Phone) °[phone] (Fax) °

## 2018-08-25 MED ORDER — METFORMIN HCL ER 500 MG PO TB24: ORAL_TABLET | ORAL | 1 refills | Status: DC

## 2018-08-25 NOTE — Telephone Encounter (Signed)
Refills sent

## 2018-11-07 ENCOUNTER — Telehealth: Payer: Self-pay | Admitting: *Deleted

## 2018-11-07 NOTE — Telephone Encounter (Signed)
Daughter [name] called to reschedule physical.  Patient requests refills of :   butalbital-acetaminophen-caffeine (FIORICET, ESGIC) 50-325-40 MG tablet   And Diazepam.  Okay to refill?

## 2018-11-08 ENCOUNTER — Encounter: Payer: Medicare Other | Admitting: Internal Medicine

## 2018-11-08 NOTE — Telephone Encounter (Signed)
Left message on machine for patient/daughter to call back.   

## 2018-11-08 NOTE — Telephone Encounter (Signed)
Let's schedule visit for tomorrow to discuss. I haven't seen her since November.

## 2018-11-08 NOTE — Telephone Encounter (Signed)
Patient's daughter, [name], returning call to [place]. Please advise.

## 2018-11-08 NOTE — Telephone Encounter (Signed)
Doxy.me appointment made. 

## 2018-11-09 ENCOUNTER — Ambulatory Visit (INDEPENDENT_AMBULATORY_CARE_PROVIDER_SITE_OTHER): Payer: Medicare Other | Admitting: Internal Medicine

## 2018-11-09 ENCOUNTER — Other Ambulatory Visit: Payer: Self-pay

## 2018-11-09 ENCOUNTER — Encounter: Payer: Medicare Other | Admitting: Internal Medicine

## 2018-11-09 DIAGNOSIS — E1140 Type 2 diabetes mellitus with diabetic neuropathy, unspecified: Secondary | ICD-10-CM | POA: Diagnosis not present

## 2018-11-09 DIAGNOSIS — E785 Hyperlipidemia, unspecified: Secondary | ICD-10-CM

## 2018-11-09 DIAGNOSIS — I10 Essential (primary) hypertension: Secondary | ICD-10-CM | POA: Diagnosis not present

## 2018-11-09 DIAGNOSIS — K257 Chronic gastric ulcer without hemorrhage or perforation: Secondary | ICD-10-CM

## 2018-11-09 DIAGNOSIS — F411 Generalized anxiety disorder: Secondary | ICD-10-CM

## 2018-11-09 MED ORDER — DIAZEPAM 2 MG PO TABS: ORAL_TABLET | ORAL | 0 refills | Status: DC

## 2018-11-09 NOTE — Progress Notes (Signed)
Virtual Visit via Telephone Note  I connected with Christie [name] on 11/09/18 at  2:30 PM EDT by telephone and verified that I am speaking with the correct person using two identifiers.   I discussed the limitations, risks, security and privacy concerns of performing an evaluation and management service by telephone and the availability of in person appointments. I also discussed with the patient that there may be a patient responsible charge related to this service. The patient expressed understanding and agreed to proceed.  We initially attempted to connect via video chat but were unable to due to technical difficulties on the patient's end, so we converted this visit to a phone visit.   Location patient: home Location provider: work office Participants present for the call: patient, provider, Christie [name] who assists with technology and history as patient is hard of hearing Patient did not have a visit in the prior 7 days to address this/these issue(s).   History of Present Illness:  This is a scheduled visit to follow up on chronic medical issues and for medication refills.  She has been doing well since I last saw her. She has no new complaints. As we discussed at last visit, she cut down on the fioricet from daily to every day. She "thinks" she has been using this for arthritis pain, she does not have headaches. She is due for valium refill that she takes one 2 mg tablet at bedtime for sleep and anxiety. She has been taking this for years.  She has a h/o well controlled HTN and DM. [name] does not have any BP or CBG information to share with me today. She also has a h/o PUD and takes daily PPI.   Observations/Objective: Patient sounds cheerful and well on the phone. I do not appreciate any increased work of breathing. Speech and thought processing are grossly intact. Patient reported vitals: none reported   Current Outpatient Medications:  .  diazepam (VALIUM) 2 MG  tablet, 1 tablet daily as needed for anxiety or sleep, Disp: 90 tablet, Rfl: 0 .  glucose blood test strip, 1 each by Other route daily., Disp: 50 each, Rfl: 12 .  LANCETS ULTRA FINE MISC, 1 each by Does not apply route daily., Disp: 100 each, Rfl: 3 .  metFORMIN (GLUCOPHAGE-XR) 500 MG 24 hr tablet, TAKE 2 TABLETS DAILY AS DIRECTED., Disp: 180 tablet, Rfl: 1 .  pantoprazole (PROTONIX) 40 MG tablet, Take 1 tablet (40 mg total) by mouth every morning., Disp: 90 tablet, Rfl: 1  Review of Systems:  Constitutional: Denies fever, chills, diaphoresis, appetite change and fatigue.  HEENT: Denies photophobia, eye pain, redness, hearing loss, ear pain, congestion, sore throat, rhinorrhea, sneezing, mouth sores, trouble swallowing, neck pain, neck stiffness and tinnitus.   Respiratory: Denies SOB, DOE, cough, chest tightness,  and wheezing.   Cardiovascular: Denies chest pain, palpitations and leg swelling.  Gastrointestinal: Denies nausea, vomiting, abdominal pain, diarrhea, constipation, blood in stool and abdominal distention.  Genitourinary: Denies dysuria, urgency, frequency, hematuria, flank pain and difficulty urinating.  Endocrine: Denies: hot or cold intolerance, sweats, changes in hair or nails, polyuria, polydipsia. Musculoskeletal: Denies myalgias, back pain, joint swelling, arthralgias and gait problem.  Skin: Denies pallor, rash and wound.  Neurological: Denies dizziness, seizures, syncope, weakness, light-headedness, numbness and headaches.  Hematological: Denies adenopathy. Easy bruising, personal or family bleeding history  Psychiatric/Behavioral: Denies suicidal ideation, mood changes, confusion, nervousness, sleep disturbance and agitation   Assessment and Plan:  Essential hypertension -Has been well  controlled in the past. -Advised Christie to do ambulatory BP monitoring and report back to us with data.  Anxiety state/Insomnia -Will refill valium #90 tabs today. She takes 1 tab  at bedtime to help with sleep and has done so for years.  Dyslipidemia -Last LDL 124 in 4/19. -Unclear statin advantage with advanced age; ok not to start.  Chronic gastric ulcer without hemorrhage and without perforation -Continue daily PPI. -Cut down on fioricet use, especially as she is not clear why she takes it (no HA).  Controlled type 2 diabetes with neuropathy (HCC) -Last A1c 6.9 in 11/19. -Continue metformin and diet. -Check A1c at next live visit.    I discussed the assessment and treatment plan with the patient. The patient was provided an opportunity to ask questions and all were answered. The patient agreed with the plan and demonstrated an understanding of the instructions.   The patient was advised to call back or seek an in-person evaluation if the symptoms worsen or if the condition fails to improve as anticipated.  I provided 23 minutes of non-face-to-face time during this encounter.   Chaya JanEstela Hernandez Acosta, MD [facility]

## 2018-11-10 ENCOUNTER — Encounter: Payer: Medicare Other | Admitting: Internal Medicine

## 2019-01-10 ENCOUNTER — Other Ambulatory Visit: Payer: Self-pay | Admitting: Internal Medicine

## 2019-01-10 DIAGNOSIS — K257 Chronic gastric ulcer without hemorrhage or perforation: Secondary | ICD-10-CM

## 2019-02-06 ENCOUNTER — Other Ambulatory Visit: Payer: Self-pay

## 2019-02-07 ENCOUNTER — Encounter: Payer: Self-pay | Admitting: Internal Medicine

## 2019-02-21 ENCOUNTER — Encounter: Payer: Self-pay | Admitting: Internal Medicine

## 2019-02-21 ENCOUNTER — Ambulatory Visit (INDEPENDENT_AMBULATORY_CARE_PROVIDER_SITE_OTHER): Payer: Medicare Other | Admitting: Internal Medicine

## 2019-02-21 ENCOUNTER — Other Ambulatory Visit: Payer: Self-pay

## 2019-02-21 VITALS — BP 130/80 | HR 82 | Temp 99.0°F | Ht 60.0 in

## 2019-02-21 DIAGNOSIS — I10 Essential (primary) hypertension: Secondary | ICD-10-CM

## 2019-02-21 DIAGNOSIS — E1140 Type 2 diabetes mellitus with diabetic neuropathy, unspecified: Secondary | ICD-10-CM

## 2019-02-21 DIAGNOSIS — E785 Hyperlipidemia, unspecified: Secondary | ICD-10-CM

## 2019-02-21 DIAGNOSIS — F411 Generalized anxiety disorder: Secondary | ICD-10-CM

## 2019-02-21 DIAGNOSIS — K257 Chronic gastric ulcer without hemorrhage or perforation: Secondary | ICD-10-CM | POA: Diagnosis not present

## 2019-02-21 DIAGNOSIS — H6123 Impacted cerumen, bilateral: Secondary | ICD-10-CM | POA: Diagnosis not present

## 2019-02-21 DIAGNOSIS — Z0000 Encounter for general adult medical examination without abnormal findings: Secondary | ICD-10-CM | POA: Diagnosis not present

## 2019-02-21 LAB — POCT GLYCOSYLATED HEMOGLOBIN (HGB A1C): Hemoglobin A1C: 7.7 % — AB (ref 4.0–5.6)

## 2019-02-21 MED ORDER — PANTOPRAZOLE SODIUM 40 MG PO TBEC: DELAYED_RELEASE_TABLET | ORAL | 1 refills | Status: DC

## 2019-02-21 MED ORDER — DIAZEPAM 2 MG PO TABS: ORAL_TABLET | ORAL | 1 refills | Status: DC

## 2019-02-21 MED ORDER — METFORMIN HCL ER 500 MG PO TB24: ORAL_TABLET | ORAL | 1 refills | Status: DC

## 2019-02-21 NOTE — Patient Instructions (Signed)
-Nice seeing you today!!  -Please schedule follow up in 4 months.   Preventive Care 83 Years and Older, Female Preventive care refers to lifestyle choices and visits with your health care provider that can promote health and wellness. This includes:  A yearly physical exam. This is also called an annual well check.  Regular dental and eye exams.  Immunizations.  Screening for certain conditions.  Healthy lifestyle choices, such as diet and exercise. What can I expect for my preventive care visit? Physical exam Your health care provider will check:  Height and weight. These may be used to calculate body mass index (BMI), which is a measurement that tells if you are at a healthy weight.  Heart rate and blood pressure.  Your skin for abnormal spots. Counseling Your health care provider may ask you questions about:  Alcohol, tobacco, and drug use.  Emotional well-being.  Home and relationship well-being.  Sexual activity.  Eating habits.  History of falls.  Memory and ability to understand (cognition).  Work and work Statistician.  Pregnancy and menstrual history. What immunizations do I need?  Influenza (flu) vaccine  This is recommended every year. Tetanus, diphtheria, and pertussis (Tdap) vaccine  You may need a Td booster every 10 years. Varicella (chickenpox) vaccine  You may need this vaccine if you have not already been vaccinated. Zoster (shingles) vaccine  You may need this after age 78. Pneumococcal conjugate (PCV13) vaccine  One dose is recommended after age 8. Pneumococcal polysaccharide (PPSV23) vaccine  One dose is recommended after age 67. Measles, mumps, and rubella (MMR) vaccine  You may need at least one dose of MMR if you were born in 1957 or later. You may also need a second dose. Meningococcal conjugate (MenACWY) vaccine  You may need this if you have certain conditions. Hepatitis A vaccine  You may need this if you have  certain conditions or if you travel or work in places where you may be exposed to hepatitis A. Hepatitis B vaccine  You may need this if you have certain conditions or if you travel or work in places where you may be exposed to hepatitis B. Haemophilus influenzae type b (Hib) vaccine  You may need this if you have certain conditions. You may receive vaccines as individual doses or as more than one vaccine together in one shot (combination vaccines). Talk with your health care provider about the risks and benefits of combination vaccines. What tests do I need? Blood tests  Lipid and cholesterol levels. These may be checked every 5 years, or more frequently depending on your overall health.  Hepatitis C test.  Hepatitis B test. Screening  Lung cancer screening. You may have this screening every year starting at age 47 if you have a 30-pack-year history of smoking and currently smoke or have quit within the past 15 years.  Colorectal cancer screening. All adults should have this screening starting at age 20 and continuing until age 50. Your health care provider may recommend screening at age 42 if you are at increased risk. You will have tests every 1-10 years, depending on your results and the type of screening test.  Diabetes screening. This is done by checking your blood sugar (glucose) after you have not eaten for a while (fasting). You may have this done every 1-3 years.  Mammogram. This may be done every 1-2 years. Talk with your health care provider about how often you should have regular mammograms.  BRCA-related cancer screening. This may be  done if you have a family history of breast, ovarian, tubal, or peritoneal cancers. Other tests  Sexually transmitted disease (STD) testing.  Bone density scan. This is done to screen for osteoporosis. You may have this done starting at age 66. Follow these instructions at home: Eating and drinking  Eat a diet that includes fresh fruits  and vegetables, whole grains, lean protein, and low-fat dairy products. Limit your intake of foods with high amounts of sugar, saturated fats, and salt.  Take vitamin and mineral supplements as recommended by your health care provider.  Do not drink alcohol if your health care provider tells you not to drink.  If you drink alcohol: ? Limit how much you have to 0-1 drink a day. ? Be aware of how much alcohol is in your drink. In the [place] one drink equals one 12 oz bottle of beer (355 mL), one 5 oz glass of wine (148 mL), or one 1 oz glass of hard liquor (44 mL). Lifestyle  Take daily care of your teeth and gums.  Stay active. Exercise for at least 30 minutes on 5 or more days each week.  Do not use any products that contain nicotine or tobacco, such as cigarettes, e-cigarettes, and chewing tobacco. If you need help quitting, ask your health care provider.  If you are sexually active, practice safe sex. Use a condom or other form of protection in order to prevent STIs (sexually transmitted infections).  Talk with your health care provider about taking a low-dose aspirin or statin. What's next?  Go to your health care provider once a year for a well check visit.  Ask your health care provider how often you should have your eyes and teeth checked.  Stay up to date on all vaccines. This information is not intended to replace advice given to you by your health care provider. Make sure you discuss any questions you have with your health care provider. Document Released: 08/09/2015 Document Revised: 07/07/2018 Document Reviewed: 07/07/2018 Elsevier Patient Education  2020 [employer].

## 2019-02-21 NOTE — Progress Notes (Signed)
Established Patient Office Visit     CC/Reason for Visit: Annual preventive exam and subsequent Medicare wellness visit  HPI: Christie [name] is a 85 y.o. female who is coming in today for the above mentioned reasons. Past Medical History is significant for: Peptic ulcer disease for which she takes daily PPI therapy.  She also has a history of well-controlled diabetes and hypertension.  She comes in today with 2 of her daughters.  She is at baseline, has significant difficulty hearing, is unable to perform ADLs independently.  She is pretty much wheelchair-bound although she can assist with transfers.  She has very little physical activity.  She has no acute complaints today.  They are requesting no further vaccinations or cancer screenings which I believe is appropriate given her advanced age.   Past Medical/Surgical History: Past Medical History:  Diagnosis Date  . ANXIETY 01/31/2007  . DIABETES MELLITUS, TYPE II 01/31/2007  . Gastric ulcer   . Headache(784.0) 01/31/2007  . HYPERLIPIDEMIA 01/31/2007  . HYPERTENSION 01/31/2007  . HYPOKALEMIA, HX OF 02/09/2008  . OSTEOARTHRITIS 01/31/2007    Past Surgical History:  Procedure Laterality Date  . ABDOMINAL HYSTERECTOMY    . CATARACT EXTRACTION Left   . ESOPHAGOGASTRODUODENOSCOPY N/A 07/02/2014   Procedure: ESOPHAGOGASTRODUODENOSCOPY (EGD);  Surgeon: Irene Shipper, MD;  Location: [facility];  Service: Endoscopy;  Laterality: N/A;    Social History:  reports that she has quit smoking. Her smoking use included cigarettes. She has never used smokeless tobacco. She reports that she does not drink alcohol or use drugs.  Allergies: Allergies  Allergen Reactions  . Codeine     Family History:  Family History  Problem Relation Age of Onset  . Breast cancer Sister   . Heart disease Neg Hx   . Esophageal cancer Neg Hx   . Lung cancer Sister   . Prostate cancer Brother   . Colon cancer Neg Hx   . Colon polyps Neg Hx   . Stomach cancer  Neg Hx      Current Outpatient Medications:  .  diazepam (VALIUM) 2 MG tablet, 1 tablet daily as needed for anxiety or sleep, Disp: 90 tablet, Rfl: 1 .  glucose blood test strip, 1 each by Other route daily., Disp: 50 each, Rfl: 12 .  LANCETS ULTRA FINE MISC, 1 each by Does not apply route daily., Disp: 100 each, Rfl: 3 .  metFORMIN (GLUCOPHAGE-XR) 500 MG 24 hr tablet, TAKE 2 TABLETS DAILY AS DIRECTED., Disp: 180 tablet, Rfl: 1 .  pantoprazole (PROTONIX) 40 MG tablet, TAKE (1) TABLET DAILY IN THE MORNING., Disp: 90 tablet, Rfl: 1  Review of Systems:  Constitutional: Denies fever, chills, diaphoresis, appetite change and fatigue.  HEENT: Denies photophobia, eye pain, redness, hearing loss, ear pain, congestion, sore throat, rhinorrhea, sneezing, mouth sores, trouble swallowing, neck pain, neck stiffness and tinnitus.   Respiratory: Denies SOB, DOE, cough, chest tightness,  and wheezing.   Cardiovascular: Denies chest pain, palpitations and leg swelling.  Gastrointestinal: Denies nausea, vomiting, abdominal pain, diarrhea, constipation, blood in stool and abdominal distention.  Genitourinary: Denies dysuria, urgency, frequency, hematuria, flank pain and difficulty urinating.  Endocrine: Denies: hot or cold intolerance, sweats, changes in hair or nails, polyuria, polydipsia. Musculoskeletal: Denies myalgias, back pain, joint swelling, arthralgias and gait problem.  Skin: Denies pallor, rash and wound.  Neurological: Denies dizziness, seizures, syncope, weakness, light-headedness, numbness and headaches.  Hematological: Denies adenopathy. Easy bruising, personal or family bleeding history  Psychiatric/Behavioral: Denies suicidal ideation,  mood changes, confusion, nervousness, sleep disturbance and agitation    Physical Exam: Vitals:   02/21/19 1259  BP: 130/80  Pulse: 82  Temp: 99 F (37.2 C)  TempSrc: Oral  SpO2: 96%  Height: 5' (1.524 m)    Body mass index is 24.61 kg/m.    Constitutional: NAD, calm, comfortable, obese, wheelchair-bound Eyes: PERRL, lids and conjunctivae normal ENMT: Mucous membranes are moist. Posterior pharynx clear of any exudate or lesions. Normal dentition. Tympanic membrane is obstructed by cerumen bilaterally.   Neck: normal, supple, no masses, no thyromegaly Respiratory: clear to auscultation bilaterally, no wheezing, no crackles. Normal respiratory effort. No accessory muscle use.  Cardiovascular: Regular rate and rhythm, no murmurs / rubs / gallops.  2+ nonpitting edema bilaterally. 2+ pedal pulses. No carotid bruits.  Abdomen: no tenderness, no masses palpated. No hepatosplenomegaly. Bowel sounds positive.  Musculoskeletal: no clubbing / cyanosis. No joint deformity upper and lower extremities. Good ROM, no contractures. Normal muscle tone.  Skin: no rashes, lesions, ulcers. No induration Neurologic: CN 2-12 grossly intact. Sensation intact, DTR normal. Strength 5/5 in all 4.  Psychiatric: Normal judgment and insight. Alert and oriented x 3. Normal mood.   Subsequent Medicare wellness visit   1. Risk factors, based on past  M,S,F -cardiovascular disease risk factors include advanced age, history of diabetes and hyperlipidemia.   2.  Physical activities: At her advanced age she is very sedentary and does not do much in the way of activity   3.  Depression/mood:  Mood is stable, not depressed   4.  Hearing:  Significant difficulty hearing, likely cerumen impactions playing a role.   5.  ADL's: She needs help with all of her ADLs   6.  Fall risk:  High fall risk   7.  Home safety: No problems identified   8.  Height weight, and visual acuity: We have not obtained height and weight as she is wheelchair-bound, family has declined visual acuity examination   9.  Counseling:  Discussed with family being conservative with lab draws and further testing given her advanced age.   10. Lab orders based on risk factors: Laboratory update  will be reviewed   11. Referral :  None today   12. Care plan:  Follow-up 4 months with diabetes   13. Cognitive assessment:  Appears cognitively intact   14. Screening: Patient provided with a written and personalized 5-10 year screening schedule in the AVS.   yes   15. Provider List Update:   PCP  16. Advance Directives: Currently she remains a full code, however I have started the goals of care discussion with family, they will get together with other family members and will get back to me at her next visit.     Office Visit from 02/21/2019 in [facility] [place]  PHQ-9 Total Score  0      Fall Risk  02/21/2019 02/21/2019 11/11/2017 10/28/2016 10/15/2015  Falls in the past year? 0 0 No No Yes  Number falls in past yr: 0 0 - - 1  Injury with Fall? 0 0 - - No  Risk for fall due to : - - - - Impaired balance/gait     Impression and Plan:  Encounter for preventive health examination  -They have elected to forego routine cancer screening, immunizations and lab draws given her advanced age, I agree with this. -Have discussed with 2 daughters present today that hearing aids and vision exams might still be beneficial for her,  they will consider and notify us if they need a referral. -I have discussed healthy lifestyle with them in detail.  Chronic gastric ulcer without hemorrhage and without perforation  -Continue Protonix.  Controlled type 2 diabetes with neuropathy (HCC)  -A1c today is 7.0 which is slightly above her previous value of 6.9. -Have discussed diet modification, will continue metformin, no plan to escalate therapy at present.  Anxiety state/insomnia -Refill diazepam which she uses as needed for sleep.  Essential hypertension  -Well-controlled. -Not currently on medications  Dyslipidemia  -Last LDL was 124 in April 2019. -Not on statin, no plan to recheck given advanced age.  Bilateral impacted cerumen  -Cerumen Desimpaction  Warm water was  applied and gentle ear lavage performed on bilateral ears. There were no complications and following the desimpaction the tympanic membranes were visible. Tympanic membranes are intact following the procedure. Auditory canals are normal. The patient reported relief of symptoms after removal of cerumen.     Patient Instructions  -Nice seeing you today!!  -Please schedule follow up in 4 months.   Preventive Care 55 Years and Older, Female Preventive care refers to lifestyle choices and visits with your health care provider that can promote health and wellness. This includes:  A yearly physical exam. This is also called an annual well check.  Regular dental and eye exams.  Immunizations.  Screening for certain conditions.  Healthy lifestyle choices, such as diet and exercise. What can I expect for my preventive care visit? Physical exam Your health care provider will check:  Height and weight. These may be used to calculate body mass index (BMI), which is a measurement that tells if you are at a healthy weight.  Heart rate and blood pressure.  Your skin for abnormal spots. Counseling Your health care provider may ask you questions about:  Alcohol, tobacco, and drug use.  Emotional well-being.  Home and relationship well-being.  Sexual activity.  Eating habits.  History of falls.  Memory and ability to understand (cognition).  Work and work Statistician.  Pregnancy and menstrual history. What immunizations do I need?  Influenza (flu) vaccine  This is recommended every year. Tetanus, diphtheria, and pertussis (Tdap) vaccine  You may need a Td booster every 10 years. Varicella (chickenpox) vaccine  You may need this vaccine if you have not already been vaccinated. Zoster (shingles) vaccine  You may need this after age 62. Pneumococcal conjugate (PCV13) vaccine  One dose is recommended after age 43. Pneumococcal polysaccharide (PPSV23) vaccine  One dose  is recommended after age 4. Measles, mumps, and rubella (MMR) vaccine  You may need at least one dose of MMR if you were born in 1957 or later. You may also need a second dose. Meningococcal conjugate (MenACWY) vaccine  You may need this if you have certain conditions. Hepatitis A vaccine  You may need this if you have certain conditions or if you travel or work in places where you may be exposed to hepatitis A. Hepatitis B vaccine  You may need this if you have certain conditions or if you travel or work in places where you may be exposed to hepatitis B. Haemophilus influenzae type b (Hib) vaccine  You may need this if you have certain conditions. You may receive vaccines as individual doses or as more than one vaccine together in one shot (combination vaccines). Talk with your health care provider about the risks and benefits of combination vaccines. What tests do I need? Blood tests  Lipid and  cholesterol levels. These may be checked every 5 years, or more frequently depending on your overall health.  Hepatitis C test.  Hepatitis B test. Screening  Lung cancer screening. You may have this screening every year starting at age 46 if you have a 30-pack-year history of smoking and currently smoke or have quit within the past 15 years.  Colorectal cancer screening. All adults should have this screening starting at age 21 and continuing until age 52. Your health care provider may recommend screening at age 3 if you are at increased risk. You will have tests every 1-10 years, depending on your results and the type of screening test.  Diabetes screening. This is done by checking your blood sugar (glucose) after you have not eaten for a while (fasting). You may have this done every 1-3 years.  Mammogram. This may be done every 1-2 years. Talk with your health care provider about how often you should have regular mammograms.  BRCA-related cancer screening. This may be done if you have a  family history of breast, ovarian, tubal, or peritoneal cancers. Other tests  Sexually transmitted disease (STD) testing.  Bone density scan. This is done to screen for osteoporosis. You may have this done starting at age 41. Follow these instructions at home: Eating and drinking  Eat a diet that includes fresh fruits and vegetables, whole grains, lean protein, and low-fat dairy products. Limit your intake of foods with high amounts of sugar, saturated fats, and salt.  Take vitamin and mineral supplements as recommended by your health care provider.  Do not drink alcohol if your health care provider tells you not to drink.  If you drink alcohol: ? Limit how much you have to 0-1 drink a day. ? Be aware of how much alcohol is in your drink. In the [place] one drink equals one 12 oz bottle of beer (355 mL), one 5 oz glass of wine (148 mL), or one 1 oz glass of hard liquor (44 mL). Lifestyle  Take daily care of your teeth and gums.  Stay active. Exercise for at least 30 minutes on 5 or more days each week.  Do not use any products that contain nicotine or tobacco, such as cigarettes, e-cigarettes, and chewing tobacco. If you need help quitting, ask your health care provider.  If you are sexually active, practice safe sex. Use a condom or other form of protection in order to prevent STIs (sexually transmitted infections).  Talk with your health care provider about taking a low-dose aspirin or statin. What's next?  Go to your health care provider once a year for a well check visit.  Ask your health care provider how often you should have your eyes and teeth checked.  Stay up to date on all vaccines. This information is not intended to replace advice given to you by your health care provider. Make sure you discuss any questions you have with your health care provider. Document Released: 08/09/2015 Document Revised: 07/07/2018 Document Reviewed: 07/07/2018 Elsevier Patient Education   2020 [place], MD [facility]

## 2019-05-01 ENCOUNTER — Telehealth: Payer: Self-pay | Admitting: Internal Medicine

## 2019-05-01 NOTE — Telephone Encounter (Signed)
Independent Assessment Form to be filled out.  Placed in [place] folder.  Please call [phone] once completed.

## 2019-05-02 NOTE — Telephone Encounter (Signed)
Form placed in [place] folder. Maybe a delay in time d/t PCP being out.

## 2019-05-16 NOTE — Telephone Encounter (Signed)
[name] called on behalf of patient asking if the forms were completed. Cb# [phone]

## 2019-05-29 NOTE — Telephone Encounter (Signed)
Pt came in to office to check status of her forms as she has not heard from anyone. She states she dropped them off here in the office almost a month ago and has not heard anything since. Forms located by Roselyn Reef in red folder in Dr. Ledell Noss office.   Pt advised by Tammy these will be completed Tuesday, when Dr. Jerilee Hoh returns to the office. Tammy apologized for the delay and inconvenience.   Please call pt when forms are complete.

## 2019-05-30 NOTE — Telephone Encounter (Signed)
Christie [name] is aware that the form is ready for pick up.

## 2019-05-30 NOTE — Telephone Encounter (Signed)
The form was picked up for the patient

## 2019-05-30 NOTE — Telephone Encounter (Signed)
Forms completed

## 2019-06-06 ENCOUNTER — Telehealth: Payer: Self-pay | Admitting: Internal Medicine

## 2019-06-06 NOTE — Telephone Encounter (Signed)
Faxed [id number] Independent Assessment form to [employer].  Mailed a copy of completed form and fax confirmation to patient.

## 2019-06-16 ENCOUNTER — Other Ambulatory Visit: Payer: Self-pay

## 2019-06-21 ENCOUNTER — Other Ambulatory Visit: Payer: Self-pay

## 2019-06-21 ENCOUNTER — Ambulatory Visit (INDEPENDENT_AMBULATORY_CARE_PROVIDER_SITE_OTHER): Payer: Medicare Other | Admitting: Internal Medicine

## 2019-06-21 ENCOUNTER — Encounter: Payer: Self-pay | Admitting: Internal Medicine

## 2019-06-21 VITALS — BP 132/80 | HR 87 | Temp 97.8°F

## 2019-06-21 DIAGNOSIS — K257 Chronic gastric ulcer without hemorrhage or perforation: Secondary | ICD-10-CM | POA: Diagnosis not present

## 2019-06-21 DIAGNOSIS — M8949 Other hypertrophic osteoarthropathy, multiple sites: Secondary | ICD-10-CM

## 2019-06-21 DIAGNOSIS — E1140 Type 2 diabetes mellitus with diabetic neuropathy, unspecified: Secondary | ICD-10-CM | POA: Diagnosis not present

## 2019-06-21 DIAGNOSIS — M159 Polyosteoarthritis, unspecified: Secondary | ICD-10-CM

## 2019-06-21 DIAGNOSIS — E785 Hyperlipidemia, unspecified: Secondary | ICD-10-CM | POA: Diagnosis not present

## 2019-06-21 DIAGNOSIS — F411 Generalized anxiety disorder: Secondary | ICD-10-CM | POA: Diagnosis not present

## 2019-06-21 DIAGNOSIS — I10 Essential (primary) hypertension: Secondary | ICD-10-CM

## 2019-06-21 LAB — POCT GLYCOSYLATED HEMOGLOBIN (HGB A1C): Hemoglobin A1C: 7 % — AB (ref 4.0–5.6)

## 2019-06-21 MED ORDER — DIAZEPAM 2 MG PO TABS: ORAL_TABLET | ORAL | 1 refills | Status: AC

## 2019-06-21 NOTE — Progress Notes (Signed)
Established Patient Office Visit     This visit occurred during the SARS-CoV-2 public health emergency.  Safety protocols were in place, including screening questions prior to the visit, additional usage of staff PPE, and extensive cleaning of exam room while observing appropriate contact time as indicated for disinfecting solutions.    CC/Reason for Visit: 30-month follow-up  HPI: Christie [name] is a 85 y.o. female who is coming in today for the above mentioned reasons. Past Medical History is significant for:  Peptic ulcer disease for which she takes daily PPI therapy.  She also has a history of well-controlled diabetes and hypertension.  She has done well since I last saw her in July, no complaints today, she is accompanied by her daughter.  They are declining flu vaccination today.  Past Medical/Surgical History: Past Medical History:  Diagnosis Date  . ANXIETY 01/31/2007  . DIABETES MELLITUS, TYPE II 01/31/2007  . Gastric ulcer   . Headache(784.0) 01/31/2007  . HYPERLIPIDEMIA 01/31/2007  . HYPERTENSION 01/31/2007  . HYPOKALEMIA, HX OF 02/09/2008  . OSTEOARTHRITIS 01/31/2007    Past Surgical History:  Procedure Laterality Date  . ABDOMINAL HYSTERECTOMY    . CATARACT EXTRACTION Left   . ESOPHAGOGASTRODUODENOSCOPY N/A 07/02/2014   Procedure: ESOPHAGOGASTRODUODENOSCOPY (EGD);  Surgeon: Hilarie Fredrickson, MD;  Location: [facility];  Service: Endoscopy;  Laterality: N/A;    Social History:  reports that she has quit smoking. Her smoking use included cigarettes. She has never used smokeless tobacco. She reports that she does not drink alcohol or use drugs.  Allergies: Allergies  Allergen Reactions  . Codeine     Family History:  Family History  Problem Relation Age of Onset  . Breast cancer Sister   . Heart disease Neg Hx   . Esophageal cancer Neg Hx   . Lung cancer Sister   . Prostate cancer Brother   . Colon cancer Neg Hx   . Colon polyps Neg Hx   . Stomach cancer Neg Hx       Current Outpatient Medications:  .  diazepam (VALIUM) 2 MG tablet, 1 tablet daily as needed for anxiety or sleep, Disp: 90 tablet, Rfl: 1 .  glucose blood test strip, 1 each by Other route daily., Disp: 50 each, Rfl: 12 .  LANCETS ULTRA FINE MISC, 1 each by Does not apply route daily., Disp: 100 each, Rfl: 3 .  metFORMIN (GLUCOPHAGE-XR) 500 MG 24 hr tablet, TAKE 2 TABLETS DAILY AS DIRECTED., Disp: 180 tablet, Rfl: 1 .  pantoprazole (PROTONIX) 40 MG tablet, TAKE (1) TABLET DAILY IN THE MORNING., Disp: 90 tablet, Rfl: 1  Review of Systems:  Constitutional: Denies fever, chills, diaphoresis, appetite change and fatigue.  HEENT: Denies photophobia, eye pain, redness, hearing loss, ear pain, congestion, sore throat, rhinorrhea, sneezing, mouth sores, trouble swallowing, neck pain, neck stiffness and tinnitus.   Respiratory: Denies SOB, DOE, cough, chest tightness,  and wheezing.   Cardiovascular: Denies chest pain, palpitations and leg swelling.  Gastrointestinal: Denies nausea, vomiting, abdominal pain, diarrhea, constipation, blood in stool and abdominal distention.  Genitourinary: Denies dysuria, urgency, frequency, hematuria, flank pain and difficulty urinating.  Endocrine: Denies: hot or cold intolerance, sweats, changes in hair or nails, polyuria, polydipsia. Musculoskeletal: Denies myalgias, back pain, joint swelling, arthralgias and gait problem.  Skin: Denies pallor, rash and wound.  Neurological: Denies dizziness, seizures, syncope, weakness, light-headedness, numbness and headaches.  Hematological: Denies adenopathy. Easy bruising, personal or family bleeding history  Psychiatric/Behavioral: Denies suicidal ideation, mood changes,  confusion, nervousness, sleep disturbance and agitation    Physical Exam: Vitals:   06/21/19 1001  BP: 132/80  Pulse: 87  Temp: 97.8 F (36.6 C)  TempSrc: Oral  SpO2: 100%    There is no height or weight on file to calculate BMI.    Constitutional: NAD, calm, comfortable, wheelchair-bound Eyes: PERRL, lids and conjunctivae normal ENMT: Mucous membranes are moist.  Neck: normal, supple, no masses, no thyromegaly Respiratory: clear to auscultation bilaterally, no wheezing, no crackles. Normal respiratory effort. No accessory muscle use.  Cardiovascular: Regular rate and rhythm, no murmurs / rubs / gallops.  2-3+ nonpitting edema bilaterally. 2+ pedal pulses. No carotid bruits.  Abdomen: no tenderness, no masses palpated. No hepatosplenomegaly. Bowel sounds positive.  Musculoskeletal: no clubbing / cyanosis. No joint deformity upper and lower extremities. Good ROM, no contractures. Normal muscle tone.  Skin: no rashes, lesions, ulcers. No induration Neurologic: Grossly intact and nonfocal Psychiatric: Normal judgment and insight. Alert and oriented x 3. Normal mood.    Impression and Plan:  Controlled type 2 diabetes with neuropathy (HCC)  -A1c today is 7.0, no change in management.  Chronic gastric ulcer without hemorrhage and without perforation -Reflux symptoms are well controlled on PPI therapy.  Essential hypertension -Well-controlled.  Anxiety state -She is requesting Valium refills today.  Dyslipidemia -Last LDL was 124, given her age we have decided not to start statin therapy.  Primary osteoarthritis involving multiple joints -She is wheelchair-bound.     Patient Instructions  -Nice seeing you today!!  -See you back in 4 months.     Lelon Frohlich, MD [facility]

## 2019-06-21 NOTE — Patient Instructions (Signed)
-  Nice seeing you today!!  -See you back in 4 months. 

## 2019-07-05 ENCOUNTER — Other Ambulatory Visit: Payer: Self-pay | Admitting: Internal Medicine

## 2019-07-05 DIAGNOSIS — K257 Chronic gastric ulcer without hemorrhage or perforation: Secondary | ICD-10-CM

## 2019-08-15 ENCOUNTER — Ambulatory Visit: Payer: Self-pay | Admitting: *Deleted

## 2019-08-15 NOTE — Telephone Encounter (Signed)
Prefer in-person as she is due for A1c. We can try virtual if they insist however.

## 2019-08-15 NOTE — Telephone Encounter (Signed)
1/13- 425- 2:20pm 1/14- 293- 5:30 am 1/15- 314 - 5:40 am 1/18 -530- 12:15 pm Daughter reports no symptoms. Patient does have place on foot- raw area on foot.Patient is having loose stool. 1/19- presently- ( patient just ate lunch) 451. Patient normal takes metformin in morning  Call to office- they want to check with PCP to see if patient needs in person visit- or if virtual will do.Daughter notified to expect call back.  Reason for Disposition . [1] Blood glucose > 300 mg/dL (99.2 mmol/L) AND [4] two or more times in a row  Answer Assessment - Initial Assessment Questions 1. BLOOD GLUCOSE: "What is your blood glucose level?"      530- fasting am- not checked today 2. ONSET: "When did you check the blood glucose?"     12:15- yesterday 3. USUAL RANGE: "What is your glucose level usually?" (e.g., usual fasting morning value, usual evening value)     130- daughter started checking when EMS came after a fall- 1/13- started glucose since 4. KETONES: "Do you check for ketones (urine or blood test strips)?" If yes, ask: "What does the test show now?"      n/a 5. TYPE 1 or 2:  "Do you know what type of diabetes you have?"  (e.g., Type 1, Type 2, Gestational; doesn't know)      Type 2 6. INSULIN: "Do you take insulin?" "What type of insulin(s) do you use? What is the mode of delivery? (syringe, pen; injection or pump)?"      no 7. DIABETES PILLS: "Do you take any pills for your diabetes?" If yes, ask: "Have you missed taking any pills recently?"     Metformin- no missed doses 8. OTHER SYMPTOMS: "Do you have any symptoms?" (e.g., fever, frequent urination, difficulty breathing, dizziness, weakness, vomiting)     no 9. PREGNANCY: "Is there any chance you are pregnant?" "When was your last menstrual period?"     n/a  Protocols used: DIABETES - HIGH BLOOD SUGAR-A-AH

## 2019-08-15 NOTE — Telephone Encounter (Signed)
Spoke with the pts caregiver and scheduled an appt for 1/22 to arrive at 9:15am.

## 2019-08-16 ENCOUNTER — Other Ambulatory Visit: Payer: Self-pay

## 2019-08-16 ENCOUNTER — Ambulatory Visit (INDEPENDENT_AMBULATORY_CARE_PROVIDER_SITE_OTHER): Payer: Medicare Other | Admitting: Podiatry

## 2019-08-16 VITALS — Temp 98.1°F

## 2019-08-16 DIAGNOSIS — M79676 Pain in unspecified toe(s): Secondary | ICD-10-CM

## 2019-08-16 DIAGNOSIS — B351 Tinea unguium: Secondary | ICD-10-CM | POA: Diagnosis not present

## 2019-08-16 DIAGNOSIS — L97322 Non-pressure chronic ulcer of left ankle with fat layer exposed: Secondary | ICD-10-CM

## 2019-08-16 DIAGNOSIS — E0843 Diabetes mellitus due to underlying condition with diabetic autonomic (poly)neuropathy: Secondary | ICD-10-CM | POA: Diagnosis not present

## 2019-08-16 MED ORDER — SANTYL 250 UNIT/GM EX OINT: 1.0000 "application " | TOPICAL_OINTMENT | Freq: Every day | CUTANEOUS | 2 refills | Status: AC

## 2019-08-16 MED ORDER — DOXYCYCLINE HYCLATE 100 MG PO TABS: 100.0000 mg | ORAL_TABLET | Freq: Two times a day (BID) | ORAL | 0 refills | Status: DC

## 2019-08-18 ENCOUNTER — Ambulatory Visit (INDEPENDENT_AMBULATORY_CARE_PROVIDER_SITE_OTHER): Payer: Medicare Other | Admitting: Internal Medicine

## 2019-08-18 ENCOUNTER — Other Ambulatory Visit: Payer: Self-pay

## 2019-08-18 ENCOUNTER — Encounter: Payer: Self-pay | Admitting: Internal Medicine

## 2019-08-18 VITALS — BP 130/70 | HR 86 | Temp 97.1°F

## 2019-08-18 DIAGNOSIS — E1165 Type 2 diabetes mellitus with hyperglycemia: Secondary | ICD-10-CM | POA: Diagnosis not present

## 2019-08-18 DIAGNOSIS — I10 Essential (primary) hypertension: Secondary | ICD-10-CM | POA: Diagnosis not present

## 2019-08-18 DIAGNOSIS — E1140 Type 2 diabetes mellitus with diabetic neuropathy, unspecified: Secondary | ICD-10-CM | POA: Diagnosis not present

## 2019-08-18 MED ORDER — METFORMIN HCL 1000 MG PO TABS: 1000.0000 mg | ORAL_TABLET | Freq: Two times a day (BID) | ORAL | 1 refills | Status: DC

## 2019-08-18 NOTE — Progress Notes (Signed)
   SUBJECTIVE 86 year old female with a history of diabetes mellitus presents to office today as a new patient with a chief complaint of an ulceration of the left medial ankle that appeared about 5 days ago. She denies any known trauma or injury. She has not had any treatment for the wound. She denies modifying factors.  She is also complaining of elongated, thickened nails that cause pain while ambulating in shoes. She is unable to trim her own nails. Patient is here for further evaluation and treatment.   Past Medical History:  Diagnosis Date  . ANXIETY 01/31/2007  . DIABETES MELLITUS, TYPE II 01/31/2007  . Gastric ulcer   . Headache(784.0) 01/31/2007  . HYPERLIPIDEMIA 01/31/2007  . HYPERTENSION 01/31/2007  . HYPOKALEMIA, HX OF 02/09/2008  . OSTEOARTHRITIS 01/31/2007    OBJECTIVE General Patient is awake, alert, and oriented x 3 and in no acute distress.  Derm Wound #1 noted to the left ankle measuring approximately 2.5 x 2.5 x 0.2 cm.   To the above-noted ulceration, there is no eschar. There is a moderate amount of slough, fibrin and necrotic tissue. Granulation tissue and wound base is red. There is no malodor. There is a minimal amount of serosanginous drainage noted. Periwound integrity is intact.  Skin is dry and supple bilateral. Negative open lesions or macerations. Nails are tender, long, thickened and dystrophic with subungual debris, consistent with onychomycosis, 1-5 bilateral. No signs of infection noted.  Vasc  DP and PT pedal pulses palpable bilaterally. Temperature gradient within normal limits.   Neuro Epicritic and protective threshold sensation diminished bilaterally.   Musculoskeletal Exam No symptomatic pedal deformities noted bilateral. Muscular strength within normal limits.  ASSESSMENT 1. Diabetes Mellitus w/ peripheral neuropathy 2. Onychomycosis of nail due to dermatophyte bilateral 3. Ulceration of the left ankle secondary to diabetes mellitus   PLAN OF CARE 1.  Patient evaluated today. 2. Instructed to maintain good pedal hygiene and foot care. Stressed importance of controlling blood sugar.  3. Mechanical debridement of nails 1-5 bilaterally performed using a nail nipper. Filed with dremel without incident.  4. Medically necessary excisional debridement including subcutaneous tissue was performed using a tissue nipper and a chisel blade. Excisional debridement of all the necrotic nonviable tissue down to healthy bleeding viable tissue was performed with post-debridement measurements same as pre-. 5. The wound was cleansed and dry sterile dressing applied. 6. Culture taken from wound.  7. Prescription for Doxycycline 100 mg #20 provided to patient.  8. Santyl ointment provided to patient.  9. Return to clinic in 3 weeks.     Felecia Shelling, DPM [facility]  Dr. Felecia Shelling, DPM    [address]                                        [place], [place] [address]                Office [phone]  Fax [phone]

## 2019-08-18 NOTE — Progress Notes (Signed)
Established Patient Office Visit     This visit occurred during the SARS-CoV-2 public health emergency.  Safety protocols were in place, including screening questions prior to the visit, additional usage of staff PPE, and extensive cleaning of exam room while observing appropriate contact time as indicated for disinfecting solutions.    CC/Reason for Visit: Discuss elevated blood sugars  HPI: Christie [name] is a 86 y.o. female who is coming in today for the above mentioned reasons.  She has a history of type 2 diabetes has been previously well controlled on Metformin 500 mg twice daily.  Daughter called because since 13 January she has noticed a trend with CBGs being in the upper 200s to 500s with an average in the upper 300s.  According to her medications have not changed, she is taking medications as prescribed, diet has not changed.   Past Medical/Surgical History: Past Medical History:  Diagnosis Date  . ANXIETY 01/31/2007  . DIABETES MELLITUS, TYPE II 01/31/2007  . Gastric ulcer   . Headache(784.0) 01/31/2007  . HYPERLIPIDEMIA 01/31/2007  . HYPERTENSION 01/31/2007  . HYPOKALEMIA, HX OF 02/09/2008  . OSTEOARTHRITIS 01/31/2007    Past Surgical History:  Procedure Laterality Date  . ABDOMINAL HYSTERECTOMY    . CATARACT EXTRACTION Left   . ESOPHAGOGASTRODUODENOSCOPY N/A 07/02/2014   Procedure: ESOPHAGOGASTRODUODENOSCOPY (EGD);  Surgeon: Irene Shipper, MD;  Location: [facility];  Service: Endoscopy;  Laterality: N/A;    Social History:  reports that she has quit smoking. Her smoking use included cigarettes. She has never used smokeless tobacco. She reports that she does not drink alcohol or use drugs.  Allergies: Allergies  Allergen Reactions  . Codeine     Family History:  Family History  Problem Relation Age of Onset  . Breast cancer Sister   . Heart disease Neg Hx   . Esophageal cancer Neg Hx   . Lung cancer Sister   . Prostate cancer Brother   . Colon cancer Neg Hx     . Colon polyps Neg Hx   . Stomach cancer Neg Hx      Current Outpatient Medications:  .  collagenase (SANTYL) ointment, Apply 1 application topically daily., Disp: 30 g, Rfl: 2 .  diazepam (VALIUM) 2 MG tablet, 1 tablet daily as needed for anxiety or sleep, Disp: 90 tablet, Rfl: 1 .  doxycycline (VIBRA-TABS) 100 MG tablet, Take 1 tablet (100 mg total) by mouth 2 (two) times daily., Disp: 20 tablet, Rfl: 0 .  glucose blood test strip, 1 each by Other route daily., Disp: 50 each, Rfl: 12 .  LANCETS ULTRA FINE MISC, 1 each by Does not apply route daily., Disp: 100 each, Rfl: 3 .  pantoprazole (PROTONIX) 40 MG tablet, TAKE (1) TABLET DAILY IN THE MORNING., Disp: 90 tablet, Rfl: 0 .  metFORMIN (GLUCOPHAGE) 1000 MG tablet, Take 1 tablet (1,000 mg total) by mouth 2 (two) times daily with a meal., Disp: 180 tablet, Rfl: 1  Review of Systems:  Constitutional: Denies fever, chills, diaphoresis, appetite change and fatigue.  HEENT: Denies photophobia, eye pain, redness, hearing loss, ear pain, congestion, sore throat, rhinorrhea, sneezing, mouth sores, trouble swallowing, neck pain, neck stiffness and tinnitus.   Respiratory: Denies SOB, DOE, cough, chest tightness,  and wheezing.   Cardiovascular: Denies chest pain, palpitations and leg swelling.  Gastrointestinal: Denies nausea, vomiting, abdominal pain, diarrhea, constipation, blood in stool and abdominal distention.  Genitourinary: Denies dysuria, urgency, frequency, hematuria, flank pain and difficulty urinating.  Endocrine:  Denies: hot or cold intolerance, sweats, changes in hair or nails, polyuria, polydipsia. Musculoskeletal: Denies myalgias, back pain, joint swelling, arthralgias and gait problem.  Skin: Denies pallor, rash and wound.  Neurological: Denies dizziness, seizures, syncope, weakness, light-headedness, numbness and headaches.  Hematological: Denies adenopathy. Easy bruising, personal or family bleeding history   Psychiatric/Behavioral: Denies suicidal ideation, mood changes, confusion, nervousness, sleep disturbance and agitation    Physical Exam: Vitals:   08/18/19 0916  BP: 130/70  Pulse: 86  Temp: (!) 97.1 F (36.2 C)  TempSrc: Temporal  SpO2: 95%     Constitutional: NAD, calm, comfortable Eyes: PERRL, lids and conjunctivae normal ENMT: Mucous membranes are moist.  Hard of hearing Respiratory: clear to auscultation bilaterally, no wheezing, no crackles. Normal respiratory effort. No accessory muscle use.  Cardiovascular: Regular rate and rhythm, no murmurs / rubs / gallops. No extremity edema.  Psychiatric: Normal judgment and insight. Alert and oriented x 3. Normal mood.    Impression and Plan:  Uncontrolled type 2 diabetes mellitus with hyperglycemia (HCC)  -Increase Metformin from 500 twice daily to thousand twice daily. -Return for follow-up in 3 months.  Essential hypertension -Well-controlled on current regimen.    Patient Instructions  -Nice seeing you today!!  -Increase metformin to 1000 mg 1 tablet twice daily.  -Schedule follow up in 3 months (cancel appointment in March).   We are committed to keeping you informed about the COVID-19 vaccine.  As the vaccine continues to become available for each phase, we will ensure that patients who meet the criteria receive the information they need to access vaccination opportunities. Continue to check your MyChart account and [email or web address] for updates. Please review the Phase 1b information below.  Following [place]'s guidelines for the distribution of COVID-19 vaccines we are pleased to share our plans to begin offering vaccines to those 65 and older (Phase 1b). Here are details of those plans:  [facility] COVID-19 Vaccination Clinic - [place] On Tuesday, Jan. 19, the [facility]) and [place] begin large-scale COVID-19 vaccinations at the [facility]. The vaccinations are appointment only and for those 65 and older.  Walk-ins will not be accepted.  All appointments are currently filled. Please join our waiting list for the next available appointments. We will contact you when appointments become available. Please do not sign up more than once.  Join Our Waiting List   Other Vaccination Opportunities in Our Area We are also working in partnership with county health agencies in our service counties to ensure continuing vaccination availability in the weeks and months ahead. Learn more about each county's vaccination efforts in the website links below:   [place]  [place]  [place]  [facility] [place]'s phase 1b vaccination guidelines, prioritizing those 65 and over as the next eligible group to receive the COVID-19 vaccine, are detailed at [email or web address]   Vaccine Safety and Effectiveness Clinical trials for the Pfizer COVID-19 vaccine involved 42,000 people and showed that the vaccine is more than 95% effective in preventing COVID-19 with no serious safety concerns. Similar results have been reported for the Moderna COVID-19 vaccine. Side effects reported in the Pfizer clinical trials include a sore arm at the injection site, fatigue, headache, chills and fever. While side effects from the Pfizer COVID-19 vaccine are higher than for a typical flu vaccine, they are lower in many ways than side effects from the leading vaccine to prevent shingles. Side effects are  signs that a vaccine is working and are related to your immune system being stimulated to produce antibodies against infection. Side effects from vaccination are far less significant than health impacts from COVID-19.  Staying Informed Pharmacists, infectious disease doctors, critical care nurses and other experts at [facility] continue to speak publicly through media interviews and direct communication with our  patients and communities about the safety, effectiveness and importance of vaccines to eliminate COVID-19. In addition, reliable information on vaccine safety, effectiveness, side effects and more is available on the following websites:  [place] Department of Health and Human Services COVID-19 Vaccine Information Website.  U.S. Centers for Disease Control and Prevention COVID-19 Librarian, academic.  Staying Safe We agree with the CDC on what we can do to help our communities get back to normal: Getting "back to normal" is going to take all of our tools. If we use all the tools we have, we stand the best chance of getting our families, communities, schools and workplaces "back to normal" sooner:  Get vaccinated as soon as vaccines become available within the phase of the state's vaccination rollout plan for which you meet the eligibility criteria.  Wear a mask.  Stay 6 feet from others and avoid crowds.  Wash hands often.  For our most current information, please visit [email or web address]      Chaya Jan, MD [facility] at [facility]

## 2019-08-18 NOTE — Patient Instructions (Signed)
-Nice seeing you today!!  -Increase metformin to 1000 mg 1 tablet twice daily.  -Schedule follow up in 3 months (cancel appointment in March).   We are committed to keeping you informed about the COVID-19 vaccine.  As the vaccine continues to become available for each phase, we will ensure that patients who meet the criteria receive the information they need to access vaccination opportunities. Continue to check your MyChart account and [email or web address] for updates. Please review the Phase 1b information below.  Following [facility]'s guidelines for the distribution of COVID-19 vaccines we are pleased to share our plans to begin offering vaccines to those 65 and older (Phase 1b). Here are details of those plans:  [place] On Tuesday, Jan. 19, the [facility]) and [place] begin large-scale COVID-19 vaccinations at the [facility]. The vaccinations are appointment only and for those 46 and older.  Walk-ins will not be accepted.  All appointments are currently filled. Please join our waiting list for the next available appointments. We will contact you when appointments become available. Please do not sign up more than once.  Join Our Waiting List   Other Vaccination Opportunities in [place] We are also working in partnership with county health agencies in our service counties to ensure continuing vaccination availability in the weeks and months ahead. Learn more about each county's vaccination efforts in the website links below:   [place]'s phase 1b vaccination guidelines, prioritizing those 65 and over as the next eligible group to receive the COVID-19 vaccine, are detailed at [email or web address].   Vaccine Safety and Effectiveness Clinical trials for the Pfizer COVID-19 vaccine  involved 42,000 people and showed that the vaccine is more than 95% effective in preventing COVID-19 with no serious safety concerns. Similar results have been reported for the Moderna COVID-19 vaccine. Side effects reported in the [place] clinical trials include a sore arm at the injection site, fatigue, headache, chills and fever. While side effects from the [place] COVID-19 vaccine are higher than for a typical flu vaccine, they are lower in many ways than side effects from the leading vaccine to prevent shingles. Side effects are signs that a vaccine is working and are related to your immune system being stimulated to produce antibodies against infection. Side effects from vaccination are far less significant than health impacts from COVID-19.  Staying Informed Pharmacists, infectious disease doctors, critical care nurses and other experts at [facility] continue to speak publicly through media interviews and direct communication with our patients and communities about the safety, effectiveness and importance of vaccines to eliminate COVID-19. In addition, reliable information on vaccine safety, effectiveness, side effects and more is available on the following websites:  [place] Department of Health and Human Services COVID-19 Vaccine Information Website.  U.S. Centers for Disease Control and Prevention [id number] Human resources officer.  Staying Safe We agree with the CDC on what we can do to help our communities get back to normal: Getting "back to normal" is going to take all of our tools. If we use all the tools we have, we stand the best chance of getting our families, communities, schools and workplaces "back to normal" sooner:  Get vaccinated as soon as vaccines become available within the phase of the state's vaccination rollout plan for which you meet the eligibility criteria.  Wear a mask.  Stay 6  feet from others and avoid crowds.  Wash hands often.  For our most current  information, please visit [email or web address]

## 2019-08-19 LAB — WOUND CULTURE
MICRO NUMBER:: 10061735
SPECIMEN QUALITY:: ADEQUATE

## 2019-09-05 ENCOUNTER — Inpatient Hospital Stay (HOSPITAL_COMMUNITY)
Admission: EM | Admit: 2019-09-05 | Discharge: 2019-09-11 | DRG: 871 | Disposition: A | Discharge status: Hospice | Payer: Medicare Other | Attending: Internal Medicine | Admitting: Internal Medicine

## 2019-09-05 ENCOUNTER — Other Ambulatory Visit: Payer: Self-pay

## 2019-09-05 ENCOUNTER — Emergency Department (HOSPITAL_COMMUNITY): Payer: Medicare Other

## 2019-09-05 DIAGNOSIS — D6959 Other secondary thrombocytopenia: Secondary | ICD-10-CM | POA: Diagnosis present

## 2019-09-05 DIAGNOSIS — L89153 Pressure ulcer of sacral region, stage 3: Secondary | ICD-10-CM | POA: Diagnosis present

## 2019-09-05 DIAGNOSIS — Z66 Do not resuscitate: Secondary | ICD-10-CM | POA: Diagnosis present

## 2019-09-05 DIAGNOSIS — E861 Hypovolemia: Secondary | ICD-10-CM | POA: Diagnosis not present

## 2019-09-05 DIAGNOSIS — A419 Sepsis, unspecified organism: Secondary | ICD-10-CM | POA: Diagnosis not present

## 2019-09-05 DIAGNOSIS — I10 Essential (primary) hypertension: Secondary | ICD-10-CM | POA: Diagnosis present

## 2019-09-05 DIAGNOSIS — R4701 Aphasia: Secondary | ICD-10-CM

## 2019-09-05 DIAGNOSIS — R29810 Facial weakness: Secondary | ICD-10-CM | POA: Diagnosis not present

## 2019-09-05 DIAGNOSIS — E872 Acidosis, unspecified: Secondary | ICD-10-CM | POA: Diagnosis present

## 2019-09-05 DIAGNOSIS — G9341 Metabolic encephalopathy: Secondary | ICD-10-CM | POA: Diagnosis present

## 2019-09-05 DIAGNOSIS — R531 Weakness: Secondary | ICD-10-CM | POA: Diagnosis not present

## 2019-09-05 DIAGNOSIS — E440 Moderate protein-calorie malnutrition: Secondary | ICD-10-CM | POA: Diagnosis present

## 2019-09-05 DIAGNOSIS — E785 Hyperlipidemia, unspecified: Secondary | ICD-10-CM | POA: Diagnosis present

## 2019-09-05 DIAGNOSIS — Z90710 Acquired absence of both cervix and uterus: Secondary | ICD-10-CM | POA: Diagnosis not present

## 2019-09-05 DIAGNOSIS — Z6828 Body mass index (BMI) 28.0-28.9, adult: Secondary | ICD-10-CM

## 2019-09-05 DIAGNOSIS — Z79899 Other long term (current) drug therapy: Secondary | ICD-10-CM

## 2019-09-05 DIAGNOSIS — N390 Urinary tract infection, site not specified: Secondary | ICD-10-CM | POA: Diagnosis present

## 2019-09-05 DIAGNOSIS — R6521 Severe sepsis with septic shock: Secondary | ICD-10-CM | POA: Diagnosis present

## 2019-09-05 DIAGNOSIS — Z515 Encounter for palliative care: Secondary | ICD-10-CM | POA: Diagnosis present

## 2019-09-05 DIAGNOSIS — E1140 Type 2 diabetes mellitus with diabetic neuropathy, unspecified: Secondary | ICD-10-CM | POA: Diagnosis present

## 2019-09-05 DIAGNOSIS — R0600 Dyspnea, unspecified: Secondary | ICD-10-CM

## 2019-09-05 DIAGNOSIS — E1165 Type 2 diabetes mellitus with hyperglycemia: Secondary | ICD-10-CM | POA: Diagnosis not present

## 2019-09-05 DIAGNOSIS — E1151 Type 2 diabetes mellitus with diabetic peripheral angiopathy without gangrene: Secondary | ICD-10-CM | POA: Diagnosis present

## 2019-09-05 DIAGNOSIS — Z7189 Other specified counseling: Secondary | ICD-10-CM | POA: Diagnosis not present

## 2019-09-05 DIAGNOSIS — D638 Anemia in other chronic diseases classified elsewhere: Secondary | ICD-10-CM | POA: Diagnosis not present

## 2019-09-05 DIAGNOSIS — N179 Acute kidney failure, unspecified: Secondary | ICD-10-CM | POA: Diagnosis present

## 2019-09-05 DIAGNOSIS — R609 Edema, unspecified: Secondary | ICD-10-CM | POA: Diagnosis not present

## 2019-09-05 DIAGNOSIS — R627 Adult failure to thrive: Secondary | ICD-10-CM | POA: Diagnosis present

## 2019-09-05 DIAGNOSIS — D649 Anemia, unspecified: Secondary | ICD-10-CM

## 2019-09-05 DIAGNOSIS — D696 Thrombocytopenia, unspecified: Secondary | ICD-10-CM | POA: Diagnosis not present

## 2019-09-05 DIAGNOSIS — E870 Hyperosmolality and hypernatremia: Secondary | ICD-10-CM | POA: Diagnosis present

## 2019-09-05 DIAGNOSIS — R079 Chest pain, unspecified: Secondary | ICD-10-CM | POA: Diagnosis not present

## 2019-09-05 DIAGNOSIS — L89520 Pressure ulcer of left ankle, unstageable: Secondary | ICD-10-CM | POA: Diagnosis present

## 2019-09-05 DIAGNOSIS — I959 Hypotension, unspecified: Secondary | ICD-10-CM | POA: Diagnosis not present

## 2019-09-05 DIAGNOSIS — Z741 Need for assistance with personal care: Secondary | ICD-10-CM | POA: Diagnosis not present

## 2019-09-05 DIAGNOSIS — L89322 Pressure ulcer of left buttock, stage 2: Secondary | ICD-10-CM | POA: Diagnosis not present

## 2019-09-05 DIAGNOSIS — Z7984 Long term (current) use of oral hypoglycemic drugs: Secondary | ICD-10-CM | POA: Diagnosis not present

## 2019-09-05 DIAGNOSIS — L8990 Pressure ulcer of unspecified site, unspecified stage: Secondary | ICD-10-CM | POA: Insufficient documentation

## 2019-09-05 DIAGNOSIS — R410 Disorientation, unspecified: Secondary | ICD-10-CM | POA: Diagnosis present

## 2019-09-05 DIAGNOSIS — F29 Unspecified psychosis not due to a substance or known physiological condition: Secondary | ICD-10-CM | POA: Diagnosis not present

## 2019-09-05 DIAGNOSIS — F411 Generalized anxiety disorder: Secondary | ICD-10-CM | POA: Diagnosis not present

## 2019-09-05 DIAGNOSIS — N1832 Chronic kidney disease, stage 3b: Secondary | ICD-10-CM | POA: Diagnosis not present

## 2019-09-05 DIAGNOSIS — E46 Unspecified protein-calorie malnutrition: Secondary | ICD-10-CM | POA: Diagnosis not present

## 2019-09-05 DIAGNOSIS — Z87891 Personal history of nicotine dependence: Secondary | ICD-10-CM | POA: Diagnosis not present

## 2019-09-05 DIAGNOSIS — Z20822 Contact with and (suspected) exposure to covid-19: Secondary | ICD-10-CM | POA: Diagnosis present

## 2019-09-05 DIAGNOSIS — I9589 Other hypotension: Secondary | ICD-10-CM | POA: Diagnosis not present

## 2019-09-05 DIAGNOSIS — R471 Dysarthria and anarthria: Secondary | ICD-10-CM | POA: Diagnosis not present

## 2019-09-05 DIAGNOSIS — Z7401 Bed confinement status: Secondary | ICD-10-CM | POA: Diagnosis not present

## 2019-09-05 DIAGNOSIS — R52 Pain, unspecified: Secondary | ICD-10-CM | POA: Diagnosis not present

## 2019-09-05 DIAGNOSIS — D61818 Other pancytopenia: Secondary | ICD-10-CM | POA: Diagnosis not present

## 2019-09-05 DIAGNOSIS — M2550 Pain in unspecified joint: Secondary | ICD-10-CM | POA: Diagnosis not present

## 2019-09-05 DIAGNOSIS — K219 Gastro-esophageal reflux disease without esophagitis: Secondary | ICD-10-CM | POA: Diagnosis present

## 2019-09-05 DIAGNOSIS — R404 Transient alteration of awareness: Secondary | ICD-10-CM | POA: Diagnosis not present

## 2019-09-05 DIAGNOSIS — R680 Hypothermia, not associated with low environmental temperature: Secondary | ICD-10-CM

## 2019-09-05 LAB — COMPREHENSIVE METABOLIC PANEL
ALT: 24 U/L (ref 0–44)
AST: 28 U/L (ref 15–41)
Albumin: 2.7 g/dL — ABNORMAL LOW (ref 3.5–5.0)
Alkaline Phosphatase: 63 U/L (ref 38–126)
Anion gap: 9 (ref 5–15)
BUN: 25 mg/dL — ABNORMAL HIGH (ref 8–23)
CO2: 19 mmol/L — ABNORMAL LOW (ref 22–32)
Calcium: 8.6 mg/dL — ABNORMAL LOW (ref 8.9–10.3)
Chloride: 117 mmol/L — ABNORMAL HIGH (ref 98–111)
Creatinine, Ser: 1.4 mg/dL — ABNORMAL HIGH (ref 0.44–1.00)
GFR calc Af Amer: 37 mL/min — ABNORMAL LOW (ref >60)
GFR calc non Af Amer: 32 mL/min — ABNORMAL LOW (ref >60)
Glucose, Bld: 184 mg/dL — ABNORMAL HIGH (ref 70–99)
Potassium: 4.9 mmol/L (ref 3.5–5.1)
Sodium: 145 mmol/L (ref 135–145)
Total Bilirubin: 0.3 mg/dL (ref 0.3–1.2)
Total Protein: 6.3 g/dL — ABNORMAL LOW (ref 6.5–8.1)

## 2019-09-05 LAB — CBC WITH DIFFERENTIAL/PLATELET
Abs Immature Granulocytes: 0.14 10*3/uL — ABNORMAL HIGH (ref 0.00–0.07)
Basophils Absolute: 0 10*3/uL (ref 0.0–0.1)
Basophils Relative: 0 %
Eosinophils Absolute: 0 10*3/uL (ref 0.0–0.5)
Eosinophils Relative: 0 %
HCT: 26.1 % — ABNORMAL LOW (ref 36.0–46.0)
Hemoglobin: 7.3 g/dL — ABNORMAL LOW (ref 12.0–15.0)
Immature Granulocytes: 3 %
Lymphocytes Relative: 33 %
Lymphs Abs: 1.5 10*3/uL (ref 0.7–4.0)
MCH: 24.3 pg — ABNORMAL LOW (ref 26.0–34.0)
MCHC: 28 g/dL — ABNORMAL LOW (ref 30.0–36.0)
MCV: 87 fL (ref 80.0–100.0)
Monocytes Absolute: 0.3 10*3/uL (ref 0.1–1.0)
Monocytes Relative: 7 %
Neutro Abs: 2.7 10*3/uL (ref 1.7–7.7)
Neutrophils Relative %: 57 %
Platelets: 68 10*3/uL — ABNORMAL LOW (ref 150–400)
RBC: 3 MIL/uL — ABNORMAL LOW (ref 3.87–5.11)
RDW: 18.8 % — ABNORMAL HIGH (ref 11.5–15.5)
WBC: 4.6 10*3/uL (ref 4.0–10.5)
nRBC: 2.6 % — ABNORMAL HIGH (ref 0.0–0.2)

## 2019-09-05 LAB — URINALYSIS, ROUTINE W REFLEX MICROSCOPIC
Bilirubin Urine: NEGATIVE
Glucose, UA: 50 mg/dL — AB
Hgb urine dipstick: NEGATIVE
Ketones, ur: NEGATIVE mg/dL
Nitrite: NEGATIVE
Protein, ur: NEGATIVE mg/dL
Specific Gravity, Urine: 1.017 (ref 1.005–1.030)
WBC, UA: >50 WBC/hpf — ABNORMAL HIGH (ref 0–5)
pH: 5 (ref 5.0–8.0)

## 2019-09-05 LAB — CBG MONITORING, ED
Glucose-Capillary: 173 mg/dL — ABNORMAL HIGH (ref 70–99)
Glucose-Capillary: 110 mg/dL — ABNORMAL HIGH (ref 70–99)

## 2019-09-05 LAB — ABO/RH: ABO/RH(D): O POS

## 2019-09-05 LAB — RESPIRATORY PANEL BY RT PCR (FLU A&B, COVID)
Influenza A by PCR: NEGATIVE
Influenza B by PCR: NEGATIVE
SARS Coronavirus 2 by RT PCR: NEGATIVE

## 2019-09-05 LAB — LACTIC ACID, PLASMA
Lactic Acid, Venous: 3.8 mmol/L (ref 0.5–1.9)
Lactic Acid, Venous: 3.8 mmol/L (ref 0.5–1.9)
Lactic Acid, Venous: 3.3 mmol/L (ref 0.5–1.9)

## 2019-09-05 LAB — TSH: TSH: 3.685 u[IU]/mL (ref 0.350–4.500)

## 2019-09-05 LAB — APTT: aPTT: 49 seconds — ABNORMAL HIGH (ref 24–36)

## 2019-09-05 LAB — POC OCCULT BLOOD, ED: Fecal Occult Bld: NEGATIVE

## 2019-09-05 LAB — PROTIME-INR
INR: 1.4 — ABNORMAL HIGH (ref 0.8–1.2)
Prothrombin Time: 16.6 seconds — ABNORMAL HIGH (ref 11.4–15.2)

## 2019-09-05 LAB — BRAIN NATRIURETIC PEPTIDE: B Natriuretic Peptide: 31.5 pg/mL (ref 0.0–100.0)

## 2019-09-05 MED ORDER — HYPROMELLOSE (GONIOSCOPIC) 2.5 % OP SOLN
1.0000 [drp] | Freq: Every day | OPHTHALMIC | Status: DC | PRN
  Filled 2019-09-05: qty 15

## 2019-09-05 MED ORDER — LACTATED RINGERS IV BOLUS
500.0000 mL | Freq: Once | INTRAVENOUS | Status: AC
  Administered 2019-09-05: 20:00:00 500 mL via INTRAVENOUS

## 2019-09-05 MED ORDER — SODIUM CHLORIDE 0.9 % IV BOLUS
1000.0000 mL | Freq: Once | INTRAVENOUS | Status: AC
  Administered 2019-09-05: 13:00:00 1000 mL via INTRAVENOUS

## 2019-09-05 MED ORDER — LACTATED RINGERS IV SOLN: INTRAVENOUS | Status: DC

## 2019-09-05 MED ORDER — SENNOSIDES-DOCUSATE SODIUM 8.6-50 MG PO TABS: 1.0000 | ORAL_TABLET | Freq: Every evening | ORAL | Status: DC | PRN

## 2019-09-05 MED ORDER — PANTOPRAZOLE SODIUM 40 MG PO TBEC
40.0000 mg | DELAYED_RELEASE_TABLET | Freq: Every day | ORAL | Status: DC
  Administered 2019-09-08 – 2019-09-11 (×4): 40 mg via ORAL
  Filled 2019-09-05 (×3): qty 1

## 2019-09-05 MED ORDER — ONDANSETRON HCL 4 MG PO TABS: 4.0000 mg | ORAL_TABLET | Freq: Four times a day (QID) | ORAL | Status: DC | PRN

## 2019-09-05 MED ORDER — ONDANSETRON HCL 4 MG/2ML IJ SOLN: 4.0000 mg | Freq: Four times a day (QID) | INTRAMUSCULAR | Status: DC | PRN

## 2019-09-05 MED ORDER — LACTATED RINGERS IV BOLUS
1000.0000 mL | Freq: Once | INTRAVENOUS | Status: AC
  Administered 2019-09-05: 14:00:00 1000 mL via INTRAVENOUS

## 2019-09-05 MED ORDER — SODIUM CHLORIDE 0.9 % IV SOLN
1.0000 g | Freq: Once | INTRAVENOUS | Status: AC
  Administered 2019-09-05: 15:00:00 1 g via INTRAVENOUS
  Filled 2019-09-05: qty 10

## 2019-09-05 MED ORDER — DIAZEPAM 2 MG PO TABS: 2.0000 mg | ORAL_TABLET | Freq: Two times a day (BID) | ORAL | Status: DC | PRN

## 2019-09-05 MED ORDER — INSULIN ASPART 100 UNIT/ML ~~LOC~~ SOLN
0.0000 [IU] | Freq: Three times a day (TID) | SUBCUTANEOUS | Status: DC
  Administered 2019-09-08: 07:00:00 2 [IU] via SUBCUTANEOUS
  Administered 2019-09-08: 12:00:00 1 [IU] via SUBCUTANEOUS
  Administered 2019-09-08 – 2019-09-09 (×3): 3 [IU] via SUBCUTANEOUS
  Administered 2019-09-09: 13:00:00 4 [IU] via SUBCUTANEOUS
  Administered 2019-09-10: 1 [IU] via SUBCUTANEOUS
  Administered 2019-09-10: 18:00:00 4 [IU] via SUBCUTANEOUS
  Administered 2019-09-10: 13:00:00 2 [IU] via SUBCUTANEOUS
  Administered 2019-09-11: 08:00:00 1 [IU] via SUBCUTANEOUS

## 2019-09-05 MED ORDER — ACETAMINOPHEN 650 MG RE SUPP: 650.0000 mg | Freq: Four times a day (QID) | RECTAL | Status: DC | PRN

## 2019-09-05 MED ORDER — SORBITOL 70 % SOLN
30.0000 mL | Freq: Every day | Status: DC | PRN
  Filled 2019-09-05: qty 30

## 2019-09-05 MED ORDER — ACETAMINOPHEN 325 MG PO TABS
650.0000 mg | ORAL_TABLET | Freq: Four times a day (QID) | ORAL | Status: DC | PRN
  Administered 2019-09-09 (×2): 650 mg via ORAL
  Filled 2019-09-05 (×3): qty 2

## 2019-09-05 NOTE — ED Notes (Signed)
Pt transported to CT ?

## 2019-09-05 NOTE — H&P (Signed)
Christie [name] is an 86 y.o. female.   Chief Complaint: Confusion, difficulty with speech, weakness HPI: The patient is a 86 yr old woman who lives at home with her family. She apparently began to be weak last Saturday. Since then she has had poor PO intake. Today she had trouble getting her words out and was very confused. She is usually able to ambulate in the halls at home with her walker, but has been unable to walk at all today.  The patient has a medical history significant for DM II, Gastric ulcer, headache, hyperlipidemia, hypertension, and osteoarthritis.   In the ED initially the patient was normotensive with SBP of 120. However this soon dropped to 70 and a code stroke was called. The patient was found to have a hemoglobin of 7.3, WBC of 4.6, Creatinine 1.40 from a baseline about 1.0. Her platelets are decreased at 60. She had a lactic acid of 3.8. She received a fluid bolus and it was repeated. It remained 3.8. CT head showed no acute changes. It did demonstrate mild cerebral atrophy and small vessel ischemic changes. CXr demosntrated no active diseae. UA was indicative of a UTI. Blood cultures are pending.  The patient will be admitted to a med/surg bed. She will receive IV fluids and IV antibiotics. Her glucoses will be followed with FSBS and SSI. She will be continued on her home medications as possible.  Past Medical History:  Diagnosis Date  . ANXIETY 01/31/2007  . DIABETES MELLITUS, TYPE II 01/31/2007  . Gastric ulcer   . Headache(784.0) 01/31/2007  . HYPERLIPIDEMIA 01/31/2007  . HYPERTENSION 01/31/2007  . HYPOKALEMIA, HX OF 02/09/2008  . OSTEOARTHRITIS 01/31/2007    Past Surgical History:  Procedure Laterality Date  . ABDOMINAL HYSTERECTOMY    . CATARACT EXTRACTION Left   . ESOPHAGOGASTRODUODENOSCOPY N/A 07/02/2014   Procedure: ESOPHAGOGASTRODUODENOSCOPY (EGD);  Surgeon: Hilarie Fredrickson, MD;  Location: [facility];  Service: Endoscopy;  Laterality: N/A;    Family History  Problem  Relation Age of Onset  . Breast cancer Sister   . Heart disease Neg Hx   . Esophageal cancer Neg Hx   . Lung cancer Sister   . Prostate cancer Brother   . Colon cancer Neg Hx   . Colon polyps Neg Hx   . Stomach cancer Neg Hx    Social History:  reports that she has quit smoking. Her smoking use included cigarettes. She has never used smokeless tobacco. She reports that she does not drink alcohol or use drugs. (Not in a hospital admission)   Allergies:  Allergies  Allergen Reactions  . Codeine     Review of systems not obtained due to patient factors.   General appearance: appears stated age, cachectic, delirious and no distress Head: Normocephalic, without obvious abnormality, atraumatic Eyes: conjunctivae/corneas clear. PERRL, EOM's intact. Fundi benign. Throat: lips, mucosa, and tongue normal; teeth and gums normal Neck: no adenopathy, no carotid bruit, no JVD, supple, symmetrical, trachea midline and thyroid not enlarged, symmetric, no tenderness/mass/nodules Resp: No increased work of breathing. No wheezes, rales, or rhonchi. Not tactile fremitus. Chest wall: no tenderness Cardio: regular rate and rhythm, S1, S2 normal, no murmur, click, rub or gallop GI: soft, non-tender; bowel sounds normal; no masses,  no organomegaly Extremities: extremities normal, atraumatic, no cyanosis or edema, 3-4+ pitting edema of lower extremities bilaterally. Pulses: Diminished distal pulses bilaterally Skin: Skin color, texture, turgor normal. No rashes or lesions or Cool, pale, dry Lymph nodes: Cervical, supraclavicular, and axillary nodes  normal. Neurologic: Grossly normal Pt unable to cooperate with neurological exam.  Results for orders placed or performed during the hospital encounter of 09/05/19 (from the past 48 hour(s))  CBG monitoring, ED     Status: Abnormal   Collection Time: 09/05/19 12:50 PM  Result Value Ref Range   Glucose-Capillary 173 (H) 70 - 99 mg/dL  Urinalysis, Routine  w reflex microscopic     Status: Abnormal   Collection Time: 09/05/19 12:57 PM  Result Value Ref Range   Color, Urine YELLOW YELLOW   APPearance CLOUDY (A) CLEAR   Specific Gravity, Urine 1.017 1.005 - 1.030   pH 5.0 5.0 - 8.0   Glucose, UA 50 (A) NEGATIVE mg/dL   Hgb urine dipstick NEGATIVE NEGATIVE   Bilirubin Urine NEGATIVE NEGATIVE   Ketones, ur NEGATIVE NEGATIVE mg/dL   Protein, ur NEGATIVE NEGATIVE mg/dL   Nitrite NEGATIVE NEGATIVE   Leukocytes,Ua LARGE (A) NEGATIVE   RBC / HPF 0-5 0 - 5 RBC/hpf   WBC, UA >50 (H) 0 - 5 WBC/hpf   Bacteria, UA MANY (A) NONE SEEN   Squamous Epithelial / LPF 0-5 0 - 5   WBC Clumps PRESENT     Comment: Performed at [facility], [address], [place], [place] [address]  Comprehensive metabolic panel     Status: Abnormal   Collection Time: 09/05/19 12:57 PM  Result Value Ref Range   Sodium 145 135 - 145 mmol/L   Potassium 4.9 3.5 - 5.1 mmol/L   Chloride 117 (H) 98 - 111 mmol/L   CO2 19 (L) 22 - 32 mmol/L   Glucose, Bld 184 (H) 70 - 99 mg/dL   BUN 25 (H) 8 - 23 mg/dL   Creatinine, Ser 1.66 (H) 0.44 - 1.00 mg/dL   Calcium 8.6 (L) 8.9 - 10.3 mg/dL   Total Protein 6.3 (L) 6.5 - 8.1 g/dL   Albumin 2.7 (L) 3.5 - 5.0 g/dL   AST 28 15 - 41 U/L   ALT 24 0 - 44 U/L   Alkaline Phosphatase 63 38 - 126 U/L   Total Bilirubin 0.3 0.3 - 1.2 mg/dL   GFR calc non Af Amer 32 (L) >60 mL/min   GFR calc Af Amer 37 (L) >60 mL/min   Anion gap 9 5 - 15    Comment: Performed at [facility], [address], [place], [place] [address]  Lactic acid, plasma     Status: Abnormal   Collection Time: 09/05/19 12:57 PM  Result Value Ref Range   Lactic Acid, Venous 3.8 (HH) 0.5 - 1.9 mmol/L    Comment: CRITICAL RESULT CALLED TO, READ BACK BY AND VERIFIED WITH: M.COCHRANE,RN 1356 09/05/2019 CLARK,S Performed at [facility], [address] [place], [place] [address]   CBC with Differential     Status: Abnormal   Collection Time: 09/05/19 12:57 PM   Result Value Ref Range   WBC 4.6 4.0 - 10.5 K/uL   RBC 3.00 (L) 3.87 - 5.11 MIL/uL   Hemoglobin 7.3 (L) 12.0 - 15.0 g/dL   HCT 32.3 (L) 55.7 - 32.2 %   MCV 87.0 80.0 - 100.0 fL   MCH 24.3 (L) 26.0 - 34.0 pg   MCHC 28.0 (L) 30.0 - 36.0 g/dL   RDW 02.5 (H) 42.7 - 06.2 %   Platelets 68 (L) 150 - 400 K/uL    Comment: REPEATED TO VERIFY PLATELET COUNT CONFIRMED BY SMEAR SPECIMEN CHECKED FOR CLOTS Immature Platelet Fraction may  be clinically indicated, consider ordering this additional test [id number]    nRBC 2.6 (H) 0.0 - 0.2 %   Neutrophils Relative % 57 %   Neutro Abs 2.7 1.7 - 7.7 K/uL   Lymphocytes Relative 33 %   Lymphs Abs 1.5 0.7 - 4.0 K/uL   Monocytes Relative 7 %   Monocytes Absolute 0.3 0.1 - 1.0 K/uL   Eosinophils Relative 0 %   Eosinophils Absolute 0.0 0.0 - 0.5 K/uL   Basophils Relative 0 %   Basophils Absolute 0.0 0.0 - 0.1 K/uL   Immature Granulocytes 3 %   Abs Immature Granulocytes 0.14 (H) 0.00 - 0.07 K/uL    Comment: Performed at [facility], [address] [place], [place] [address]  TSH     Status: None   Collection Time: 09/05/19 12:57 PM  Result Value Ref Range   TSH 3.685 0.350 - 4.500 uIU/mL    Comment: Performed by a 3rd Generation assay with a functional sensitivity of <=0.01 uIU/mL. Performed at [facility], [address], [place], [place] [address]   POC occult blood, ED     Status: None   Collection Time: 09/05/19  2:26 PM  Result Value Ref Range   Fecal Occult Bld NEGATIVE NEGATIVE  Respiratory Panel by RT PCR (Flu A&B, Covid) - Nasopharyngeal Swab     Status: None   Collection Time: 09/05/19  2:30 PM   Specimen: Nasopharyngeal Swab  Result Value Ref Range   SARS Coronavirus 2 by RT PCR NEGATIVE NEGATIVE    Comment: (NOTE) SARS-CoV-2 target nucleic acids are NOT DETECTED. The SARS-CoV-2 RNA is generally detectable in upper respiratoy specimens during the acute phase of infection. The lowest concentration of SARS-CoV-2 viral  copies this assay can detect is 131 copies/mL. A negative result does not preclude SARS-Cov-2 infection and should not be used as the sole basis for treatment or other patient management decisions. A negative result may occur with  improper specimen collection/handling, submission of specimen other than nasopharyngeal swab, presence of viral mutation(s) within the areas targeted by this assay, and inadequate number of viral copies (<131 copies/mL). A negative result must be combined with clinical observations, patient history, and epidemiological information. The expected result is Negative. Fact Sheet for Patients:  https://www.moore.com/ Fact Sheet for Healthcare Providers:  https://www.young.biz/ This test is not yet ap proved or cleared by the [place] FDA and  has been authorized for detection and/or diagnosis of SARS-CoV-2 by FDA under an Emergency Use Authorization (EUA). This EUA will remain  in effect (meaning this test can be used) for the duration of the COVID-19 declaration under Section 564(b)(1) of the Act, 21 U.S.C. section 360bbb-3(b)(1), unless the authorization is terminated or revoked sooner.    Influenza A by PCR NEGATIVE NEGATIVE   Influenza B by PCR NEGATIVE NEGATIVE    Comment: (NOTE) The Xpert Xpress SARS-CoV-2/FLU/RSV assay is intended as an aid in  the diagnosis of influenza from Nasopharyngeal swab specimens and  should not be used as a sole basis for treatment. Nasal washings and  aspirates are unacceptable for Xpert Xpress SARS-CoV-2/FLU/RSV  testing. Fact Sheet for Patients: https://www.moore.com/ Fact Sheet for Healthcare Providers: https://www.young.biz/ This test is not yet approved or cleared by the [place] FDA and  has been authorized for detection and/or diagnosis of SARS-CoV-2 by  FDA under an Emergency Use Authorization (EUA). This EUA will remain  in  effect (meaning this test can be used) for the duration of  the  Covid-19 declaration under Section 564(b)(1) of the Act, 21  U.S.C. section 360bbb-3(b)(1), unless the authorization is  terminated or revoked. Performed at [facility], [place] [address] [place], [place] [address]   Type and screen [place]     Status: None   Collection Time: 09/05/19  2:35 PM  Result Value Ref Range   ABO/RH(D) O POS    Antibody Screen NEG    Sample Expiration      09/08/2019,2359 Performed at [facility], [place] [address] [place], [place] [address]   ABO/Rh     Status: None   Collection Time: 09/05/19  2:35 PM  Result Value Ref Range   ABO/RH(D)      O POS Performed at [place] [address], [place], [place] [address]   Lactic acid, plasma     Status: Abnormal   Collection Time: 09/05/19  2:57 PM  Result Value Ref Range   Lactic Acid, Venous 3.8 (HH) 0.5 - 1.9 mmol/L    Comment: CRITICAL VALUE NOTED.  VALUE IS CONSISTENT WITH PREVIOUSLY REPORTED AND CALLED VALUE. Performed at [facility], [place] [address], [place], [place] [address]    @[email or web address] @  Blood pressure (!) 144/65, pulse 70, temperature (!) 90 F (32.2 C), temperature source Rectal, resp. rate 11, SpO2 100 %.    Assessment/Plan Problem  Sepsis Secondary to Uti (Hcc)  Hypotension  Confusion  Generalized Weakness  Lactic Acidosis  Anemia in Other Chronic Diseases Classified Elsewhere  Thrombocytopenia (Hcc)  Anxiety State   Sepsis due to UTI: The patient will be started on rocephin empirically. Blood cultures x 2 have been drawn. Urine culture will be ordered. She will be given IV fluids. Lactic acid will be tracted.   Hypotension: Continue IV fludis. Hold antihypertensives. Monitor.  TME: Confusion. Due to acute illness. Will minimize risk of delirium.  Anemia: Hemoglobin is 7.3. Monitor and transfuse for hemoglobin less than 7.0.  Thrombocytopenia: Platelets are 60.  Likely due to acute illness. Monitor.   Anxiety: Noted. Continue home meds as possible to avoid withdrawal.  FTT: Due to acute illness. PT/OT once feasible.  Lower extremity edema: Cause unknown. Possibly Venous stasis. No echocardiogram is available. Will check echo to help guide IV fluid therapy. No diuresis due to hypotension.  I have seen and examined this patient myself. I have spent 87 minutes in her evaluation and care. More than 50% of this was spent in counseling with the patient's daughter who is at bedside.  DVT prophylaxis: SCD's CODE STATUS: Full Code Family Communication: Patient discussed with daughter who was at bedside Disposition: Med/surg  Darian Ace 09/05/2019, 4:31 PM

## 2019-09-05 NOTE — ED Notes (Signed)
Pt's daughter at bedside. Stated pt started acting more weak on Saturday, but has progressively declined since. Per daughter pt's speech was worse today and pt was unable to stand on her own which is why she called EMS.

## 2019-09-05 NOTE — ED Notes (Signed)
Pt one liter bolus of LR finished, warm fluids continued at 14ml per hour per order, pressure dropped back to 65/35, second bolus of LR started and admitting paged again

## 2019-09-05 NOTE — Progress Notes (Addendum)
Patient was brought up from ED on stretcher. Patient is assisted over to bed. Sternal rub done and patient moans quietly. She does not open her eyes. Bear hugger placed on patient. CHG bath given. Unstageable ulcer to right inner ankle, measure at 4.5/3in. Unstageable to bottom of left heel noted. Also she has a stage 2 to coccyx and also stage 2 to right buttock. Wound consult placed. Foam placed over these areas. Vitals 96.72F rectal, blood pressure set up in hourly cycles. HR 90s, 100% on room air.  Patient came up from ED with red mews. Patient currently is DNR, MD are aware of patient status.  0008- Patient b/p went down to 45 nonsustained. Patient responds to sternal rub. Yelled out "stop" sats 100%, b/p 107/64  0253- Mews continue to fire red. Not an acute change.

## 2019-09-05 NOTE — ED Triage Notes (Addendum)
Pt here from home via [facility] for stroke like symptoms and ams that started 4 days ago. Per family, pt started having R side weakness and R side facial droop 4 days ago, at baseline pt is alert to self and can talk but has had multiple falls recently. Per Ems and upon arrival pt is not speaking or following  Commands. VSS.

## 2019-09-05 NOTE — ED Notes (Addendum)
Pt in room, found to be severely hypotensive and completely unresponsive to sternal rub, placed pt on NRB, EDP to bedside to talk to family, admitting paged, fluids bolus started at 999.

## 2019-09-05 NOTE — ED Provider Notes (Signed)
[facility] EMERGENCY DEPARTMENT Provider Note   CSN: [id number] Arrival date & time: 09/05/19  1238     History Chief Complaint  Patient presents with  . Altered Mental Status    Christie [name] is a 86 y.o. female. Level 5 caveat due to altered mental status HPI Patient brought in by EMS for weakness over the last 4 days.  Reportedly has been declining since Saturday.  More confusion more weakness today.  Patient really cannot provide history and history comes from patient's daughter and EMS.  Questionable right-sided weakness per history.  Has had falls.  On arrival found to have a rectal temperature of 90 F.  Patient really cannot provide much history.    Past Medical History:  Diagnosis Date  . ANXIETY 01/31/2007  . DIABETES MELLITUS, TYPE II 01/31/2007  . Gastric ulcer   . Headache(784.0) 01/31/2007  . HYPERLIPIDEMIA 01/31/2007  . HYPERTENSION 01/31/2007  . HYPOKALEMIA, HX OF 02/09/2008  . OSTEOARTHRITIS 01/31/2007    Patient Active Problem List   Diagnosis Date Noted  . Acute anemia 09/06/2019  . Aphasia 09/06/2019  . Pressure injury of skin 09/06/2019  . Sepsis secondary to UTI (HCC) 09/05/2019  . Hypotension 09/05/2019  . Confusion 09/05/2019  . Generalized weakness 09/05/2019  . Lactic acidosis 09/05/2019  . Anemia in other chronic diseases classified elsewhere 09/05/2019  . Thrombocytopenia (HCC) 09/05/2019  . Sepsis (HCC) 09/05/2019  . Controlled type 2 diabetes with neuropathy (HCC) 04/15/2016  . Gastric ulcer 07/02/2014  . HYPOKALEMIA, HX OF 02/09/2008  . Dyslipidemia 01/31/2007  . Anxiety state 01/31/2007  . Essential hypertension 01/31/2007  . Osteoarthritis 01/31/2007  . HEADACHE 01/31/2007    Past Surgical History:  Procedure Laterality Date  . ABDOMINAL HYSTERECTOMY    . CATARACT EXTRACTION Left   . ESOPHAGOGASTRODUODENOSCOPY N/A 07/02/2014   Procedure: ESOPHAGOGASTRODUODENOSCOPY (EGD);  Surgeon: Hilarie Fredrickson, MD;  Location: [facility];  Service: Endoscopy;  Laterality: N/A;     OB History   No obstetric history on file.     Family History  Problem Relation Age of Onset  . Breast cancer Sister   . Heart disease Neg Hx   . Esophageal cancer Neg Hx   . Lung cancer Sister   . Prostate cancer Brother   . Colon cancer Neg Hx   . Colon polyps Neg Hx   . Stomach cancer Neg Hx     Social History   Tobacco Use  . Smoking status: Former Smoker    Types: Cigarettes  . Smokeless tobacco: Never Used  Substance Use Topics  . Alcohol use: No    Alcohol/week: 0.0 standard drinks  . Drug use: No    Home Medications Prior to Admission medications   Medication Sig Start Date End Date Taking? Authorizing Provider  carboxymethylcellulose (REFRESH PLUS) 0.5 % SOLN Place 1 drop into both eyes daily as needed (dry eyes).   Yes [provider]  collagenase (SANTYL) ointment Apply 1 application topically daily. Patient taking differently: Apply 1 application topically daily. Left foot 08/16/19  Yes Felecia Shelling, DPM  diazepam (VALIUM) 2 MG tablet 1 tablet daily as needed for anxiety or sleep 06/21/19  Yes Philip Aspen, Limmie Patricia, MD  metFORMIN (GLUCOPHAGE) 1000 MG tablet Take 1 tablet (1,000 mg total) by mouth 2 (two) times daily with a meal. 08/18/19  Yes Philip Aspen, Limmie Patricia, MD  pantoprazole (PROTONIX) 40 MG tablet TAKE (1) TABLET DAILY IN THE MORNING. Patient taking differently: Take 40  mg by mouth daily. TAKE (1) TABLET DAILY IN THE MORNING. 07/05/19  Yes Isaac Bliss, Rayford Halsted, MD  glucose blood test strip 1 each by Other route daily. 02/08/14   Marletta Lor, MD  LANCETS ULTRA FINE MISC 1 each by Does not apply route daily. 04/18/12   Marletta Lor, MD    Allergies    Codeine  Review of Systems   Review of Systems  Unable to perform ROS: Mental status change    Physical Exam Updated Vital Signs BP 122/82   Pulse 84   Temp 97.7 F (36.5 C) (Axillary)   Resp (!) 31    Ht 5' (1.524 m)   Wt 67.3 kg   SpO2 98%   BMI 28.98 kg/m   Physical Exam Vitals reviewed.  HENT:     Head:     Comments: Face appears symmetric. Eyes:     Comments: Eye movements appear grossly intact.  Cardiovascular:     Rate and Rhythm: Regular rhythm.  Pulmonary:     Breath sounds: Wheezing present. No rhonchi.  Abdominal:     Tenderness: There is no abdominal tenderness.  Skin:    Capillary Refill: Capillary refill takes less than 2 seconds.  Neurological:     Mental Status: She is alert.     Comments: Patient is confused.  Really cannot provide much history.  Face appears symmetric.  Will grip hands bilaterally but does appear stronger on the left compared to the right.     ED Results / Procedures / Treatments   Labs (all labs ordered are listed, but only abnormal results are displayed) Labs Reviewed  URINE CULTURE - Abnormal; Notable for the following components:      Result Value   Culture MULTIPLE SPECIES PRESENT, SUGGEST RECOLLECTION (*)    All other components within normal limits  URINALYSIS, ROUTINE W REFLEX MICROSCOPIC - Abnormal; Notable for the following components:   APPearance CLOUDY (*)    Glucose, UA 50 (*)    Leukocytes,Ua LARGE (*)    WBC, UA >50 (*)    Bacteria, UA MANY (*)    All other components within normal limits  COMPREHENSIVE METABOLIC PANEL - Abnormal; Notable for the following components:   Chloride 117 (*)    CO2 19 (*)    Glucose, Bld 184 (*)    BUN 25 (*)    Creatinine, Ser 1.40 (*)    Calcium 8.6 (*)    Total Protein 6.3 (*)    Albumin 2.7 (*)    GFR calc non Af Amer 32 (*)    GFR calc Af Amer 37 (*)    All other components within normal limits  LACTIC ACID, PLASMA - Abnormal; Notable for the following components:   Lactic Acid, Venous 3.8 (*)    All other components within normal limits  LACTIC ACID, PLASMA - Abnormal; Notable for the following components:   Lactic Acid, Venous 3.8 (*)    All other components within  normal limits  CBC WITH DIFFERENTIAL/PLATELET - Abnormal; Notable for the following components:   RBC 3.00 (*)    Hemoglobin 7.3 (*)    HCT 26.1 (*)    MCH 24.3 (*)    MCHC 28.0 (*)    RDW 18.8 (*)    Platelets 68 (*)    nRBC 2.6 (*)    Abs Immature Granulocytes 0.14 (*)    All other components within normal limits  APTT - Abnormal; Notable for the following components:  aPTT 49 (*)    All other components within normal limits  PROTIME-INR - Abnormal; Notable for the following components:   Prothrombin Time 16.6 (*)    INR 1.4 (*)    All other components within normal limits  HEMOGLOBIN A1C - Abnormal; Notable for the following components:   Hgb A1c MFr Bld 9.3 (*)    All other components within normal limits  BASIC METABOLIC PANEL - Abnormal; Notable for the following components:   Sodium 147 (*)    Potassium 5.3 (*)    Chloride 121 (*)    CO2 19 (*)    BUN 25 (*)    Creatinine, Ser 1.27 (*)    Calcium 8.1 (*)    GFR calc non Af Amer 36 (*)    GFR calc Af Amer 41 (*)    All other components within normal limits  CBC - Abnormal; Notable for the following components:   RBC 2.71 (*)    Hemoglobin 6.6 (*)    HCT 22.9 (*)    MCH 24.4 (*)    MCHC 28.8 (*)    RDW 18.2 (*)    Platelets 53 (*)    nRBC 2.7 (*)    All other components within normal limits  LACTIC ACID, PLASMA - Abnormal; Notable for the following components:   Lactic Acid, Venous 3.3 (*)    All other components within normal limits  LACTIC ACID, PLASMA - Abnormal; Notable for the following components:   Lactic Acid, Venous 3.4 (*)    All other components within normal limits  CBG MONITORING, ED - Abnormal; Notable for the following components:   Glucose-Capillary 173 (*)    All other components within normal limits  CBG MONITORING, ED - Abnormal; Notable for the following components:   Glucose-Capillary 110 (*)    All other components within normal limits  RESPIRATORY PANEL BY RT PCR (FLU A&B, COVID)    CULTURE, BLOOD (ROUTINE X 2)  CULTURE, BLOOD (ROUTINE X 2)  MRSA PCR SCREENING  URINE CULTURE  TSH  BRAIN NATRIURETIC PEPTIDE  GLUCOSE, CAPILLARY  GLUCOSE, CAPILLARY  VITAMIN B12  FERRITIN  IRON AND TIBC  FOLATE RBC  VITAMIN D 25 HYDROXY (VIT D DEFICIENCY, FRACTURES)  BRAIN NATRIURETIC PEPTIDE  PROCALCITONIN  LACTIC ACID, PLASMA  LACTIC ACID, PLASMA  POC OCCULT BLOOD, ED  TYPE AND SCREEN  ABO/RH  PREPARE RBC (CROSSMATCH)    EKG EKG Interpretation  Date/Time:  Tuesday September 05 2019 12:39:45 EST Ventricular Rate:  56 PR Interval:    QRS Duration: 126 QT Interval:  496 QTC Calculation: 479 R Axis:   -18 Text Interpretation: Sinus rhythm Left bundle branch block Baseline wander in lead(s) V4 Confirmed by Benjiman Core [phone]) on 09/05/2019 12:49:25 PM Also confirmed by Benjiman Core [phone]), editor Erenest Rasher ([id number])  on 09/06/2019 9:10:57 AM   Radiology CT Head Wo Contrast  Result Date: 09/05/2019 CLINICAL DATA:  Focal neurologic deficit greater than 6 hours, right-sided weakness and right-sided facial droop for 4 days EXAM: CT HEAD WITHOUT CONTRAST TECHNIQUE: Contiguous axial images were obtained from the base of the skull through the vertex without intravenous contrast. COMPARISON:  None. FINDINGS: Brain: Hypodensities in the bilateral periventricular white matter are compatible with age-indeterminate small vessel ischemic changes, favor chronic. No other signs of acute infarct or hemorrhage. There is mild diffuse cerebral atrophy with ex vacuo dilatation of the lateral ventricles. Remaining midline structures are unremarkable. There are no acute extra-axial fluid collections. Vascular: No hyperdense vessel  or unexpected calcification. Skull: Normal. Negative for fracture or focal lesion. Sinuses/Orbits: No acute finding. Other: None IMPRESSION: 1. No CT evidence of acute intracranial abnormality. 2. Age-indeterminate small vessel ischemic changes in the bilateral  periventricular white matter. 3. Mild diffuse cerebral atrophy. Electronically Signed   By: Sharlet Salina M.D.   On: 09/05/2019 15:32   DG Chest Port 1 View  Result Date: 09/06/2019 CLINICAL DATA:  Dyspnea. EXAM: PORTABLE CHEST 1 VIEW COMPARISON:  September 05, 2019. FINDINGS: Stable cardiomegaly. Atherosclerosis of thoracic aorta. No pneumothorax is noted. Interval development of left basilar atelectasis or infiltrate is noted with possible small pleural effusion. Right lung is unremarkable. Bony thorax is unremarkable. IMPRESSION: Aortic atherosclerosis. Interval development of left basilar atelectasis or infiltrate with possible small pleural effusion. Electronically Signed   By: Lupita Raider M.D.   On: 09/06/2019 09:41   DG Chest Portable 1 View  Result Date: 09/05/2019 CLINICAL DATA:  Chest pain. EXAM: PORTABLE CHEST 1 VIEW COMPARISON:  None. FINDINGS: Mild cardiomegaly is noted. No pneumothorax or pleural effusion is noted. Both lungs are clear. The visualized skeletal structures are unremarkable. IMPRESSION: No active disease. Electronically Signed   By: Lupita Raider M.D.   On: 09/05/2019 13:23    Procedures Procedures (including critical care time)  Medications Ordered in ED Medications  acetaminophen (TYLENOL) tablet 650 mg (has no administration in time range)    Or  acetaminophen (TYLENOL) suppository 650 mg (has no administration in time range)  senna-docusate (Senokot-S) tablet 1 tablet (has no administration in time range)  sorbitol 70 % solution 30 mL (has no administration in time range)  ondansetron (ZOFRAN) tablet 4 mg (has no administration in time range)    Or  ondansetron (ZOFRAN) injection 4 mg (has no administration in time range)  diazepam (VALIUM) tablet 2 mg (has no administration in time range)  pantoprazole (PROTONIX) EC tablet 40 mg (40 mg Oral Not Given 09/06/19 1206)  hydroxypropyl methylcellulose / hypromellose (ISOPTO TEARS / GONIOVISC) 2.5 % ophthalmic  solution 1 drop (has no administration in time range)  insulin aspart (novoLOG) injection 0-6 Units (0 Units Subcutaneous Not Given 09/06/19 1157)  collagenase (SANTYL) ointment ( Topical Given 09/06/19 1145)  dextrose 5 %-0.45 % sodium chloride infusion ( Intravenous New Bag/Given 09/06/19 1341)  vancomycin (VANCOREADY) IVPB 1250 mg/250 mL (has no administration in time range)  sodium chloride 0.9 % bolus 1,000 mL (0 mLs Intravenous Stopped 09/05/19 1423)  cefTRIAXone (ROCEPHIN) 1 g in sodium chloride 0.9 % 100 mL IVPB (0 g Intravenous Stopped 09/05/19 1557)  lactated ringers bolus 1,000 mL (0 mLs Intravenous Stopped 09/05/19 1557)  lactated ringers bolus 500 mL (500 mLs Intravenous New Bag/Given 09/05/19 1955)  lactated ringers bolus 250 mL (250 mLs Intravenous New Bag/Given 09/06/19 0610)  0.9 %  sodium chloride infusion (Manually program via Guardrails IV Fluids) ( Intravenous New Bag/Given 09/06/19 1158)  vancomycin (VANCOREADY) IVPB 1500 mg/300 mL (1,500 mg Intravenous New Bag/Given 09/06/19 1251)    ED Course  I have reviewed the triage vital signs and the nursing notes.  Pertinent labs & imaging results that were available during my care of the patient were reviewed by me and considered in my medical decision making (see chart for details).    MDM Rules/Calculators/A&P                      Patient with mental status change.  Found at home.  Appears to have urinary tract infection.  Blood pressure is improved with treatment.  First 2 lactic acid 3.8.  Hemoglobin was 7.3.  Somewhat unsure of baseline.  Admit to internal medicine.  CRITICAL CARE Performed by: Benjiman Core Total critical care time: 30 minutes Critical care time was exclusive of separately billable procedures and treating other patients. Critical care was necessary to treat or prevent imminent or life-threatening deterioration. Critical care was time spent personally by me on the following activities: development of treatment  plan with patient and/or surrogate as well as nursing, discussions with consultants, evaluation of patient's response to treatment, examination of patient, obtaining history from patient or surrogate, ordering and performing treatments and interventions, ordering and review of laboratory studies, ordering and review of radiographic studies, pulse oximetry and re-evaluation of patient's condition.  Final Clinical Impression(s) / ED Diagnoses Final diagnoses:  Sepsis with encephalopathy and septic shock, due to unspecified organism (HCC)  Anemia, unspecified type  Thrombocytopenia [facility])    Rx / DC Orders ED Discharge Orders    None       Benjiman Core, MD 09/06/19 1611

## 2019-09-05 NOTE — ED Notes (Signed)
Pt now more alert, pt responds to voice, family at bedside and updated on plan of care.

## 2019-09-05 NOTE — Progress Notes (Signed)
Notified provider of need to order repeat lactic acid (3rd). .  

## 2019-09-05 NOTE — ED Notes (Signed)
Bair hugger applied.

## 2019-09-05 NOTE — ED Provider Notes (Signed)
I was called to bedside as the patient was hypotensive, less responsive than prior. Patient's blood pressure was low, with a MAP less than 60, she remains hypothermic. Chart review to clear the patient on UTI, complicated by likely sepsis. I discussed findings again with the patient's daughter, and now after several liters of fluid, and antibiotics, with declining condition we discussed implications, prognosis, and goals of care. Daughter notes that the patient is indicated she wants no heroic measures, and we specifically discussed DNR, DNI status, to which the daughter is agreeable.  Medical interventions will continue.   Gerhard Munch, MD 09/05/19 [phone]

## 2019-09-06 ENCOUNTER — Inpatient Hospital Stay (HOSPITAL_COMMUNITY): Payer: Medicare Other

## 2019-09-06 ENCOUNTER — Ambulatory Visit: Payer: Medicare Other | Admitting: Podiatry

## 2019-09-06 DIAGNOSIS — L8990 Pressure ulcer of unspecified site, unspecified stage: Secondary | ICD-10-CM | POA: Insufficient documentation

## 2019-09-06 DIAGNOSIS — R4701 Aphasia: Secondary | ICD-10-CM

## 2019-09-06 DIAGNOSIS — D649 Anemia, unspecified: Secondary | ICD-10-CM

## 2019-09-06 LAB — CBC
HCT: 22.9 % — ABNORMAL LOW (ref 36.0–46.0)
HCT: 25.9 % — ABNORMAL LOW (ref 36.0–46.0)
Hemoglobin: 6.6 g/dL — CL (ref 12.0–15.0)
Hemoglobin: 8 g/dL — ABNORMAL LOW (ref 12.0–15.0)
MCH: 24.4 pg — ABNORMAL LOW (ref 26.0–34.0)
MCH: 25.3 pg — ABNORMAL LOW (ref 26.0–34.0)
MCHC: 28.8 g/dL — ABNORMAL LOW (ref 30.0–36.0)
MCHC: 30.9 g/dL (ref 30.0–36.0)
MCV: 84.5 fL (ref 80.0–100.0)
MCV: 82 fL (ref 80.0–100.0)
Platelets: 53 10*3/uL — ABNORMAL LOW (ref 150–400)
Platelets: 40 10*3/uL — ABNORMAL LOW (ref 150–400)
RBC: 2.71 MIL/uL — ABNORMAL LOW (ref 3.87–5.11)
RBC: 3.16 MIL/uL — ABNORMAL LOW (ref 3.87–5.11)
RDW: 18.2 % — ABNORMAL HIGH (ref 11.5–15.5)
RDW: 17.2 % — ABNORMAL HIGH (ref 11.5–15.5)
WBC: 6 10*3/uL (ref 4.0–10.5)
WBC: 7.9 10*3/uL (ref 4.0–10.5)
nRBC: 2.7 % — ABNORMAL HIGH (ref 0.0–0.2)
nRBC: 1.5 % — ABNORMAL HIGH (ref 0.0–0.2)

## 2019-09-06 LAB — LACTIC ACID, PLASMA
Lactic Acid, Venous: 1.2 mmol/L (ref 0.5–1.9)
Lactic Acid, Venous: 1.4 mmol/L (ref 0.5–1.9)
Lactic Acid, Venous: 3.4 mmol/L (ref 0.5–1.9)

## 2019-09-06 LAB — IRON AND TIBC
Iron: 53 ug/dL (ref 28–170)
Saturation Ratios: 34 % — ABNORMAL HIGH (ref 10.4–31.8)
TIBC: 154 ug/dL — ABNORMAL LOW (ref 250–450)
UIBC: 101 ug/dL

## 2019-09-06 LAB — VITAMIN B12: Vitamin B-12: 589 pg/mL (ref 180–914)

## 2019-09-06 LAB — GLUCOSE, CAPILLARY
Glucose-Capillary: 73 mg/dL (ref 70–99)
Glucose-Capillary: 88 mg/dL (ref 70–99)
Glucose-Capillary: 89 mg/dL (ref 70–99)
Glucose-Capillary: 95 mg/dL (ref 70–99)

## 2019-09-06 LAB — BASIC METABOLIC PANEL
Anion gap: 7 (ref 5–15)
BUN: 25 mg/dL — ABNORMAL HIGH (ref 8–23)
CO2: 19 mmol/L — ABNORMAL LOW (ref 22–32)
Calcium: 8.1 mg/dL — ABNORMAL LOW (ref 8.9–10.3)
Chloride: 121 mmol/L — ABNORMAL HIGH (ref 98–111)
Creatinine, Ser: 1.27 mg/dL — ABNORMAL HIGH (ref 0.44–1.00)
GFR calc Af Amer: 41 mL/min — ABNORMAL LOW (ref >60)
GFR calc non Af Amer: 36 mL/min — ABNORMAL LOW (ref >60)
Glucose, Bld: 73 mg/dL (ref 70–99)
Potassium: 5.3 mmol/L — ABNORMAL HIGH (ref 3.5–5.1)
Sodium: 147 mmol/L — ABNORMAL HIGH (ref 135–145)

## 2019-09-06 LAB — BRAIN NATRIURETIC PEPTIDE: B Natriuretic Peptide: 125.1 pg/mL — ABNORMAL HIGH (ref 0.0–100.0)

## 2019-09-06 LAB — HEMOGLOBIN A1C
Hgb A1c MFr Bld: 9.3 % — ABNORMAL HIGH (ref 4.8–5.6)
Mean Plasma Glucose: 220 mg/dL

## 2019-09-06 LAB — PROCALCITONIN: Procalcitonin: <0.1 ng/mL

## 2019-09-06 LAB — URINE CULTURE

## 2019-09-06 LAB — FERRITIN: Ferritin: 585 ng/mL — ABNORMAL HIGH (ref 11–307)

## 2019-09-06 LAB — MRSA PCR SCREENING: MRSA by PCR: NEGATIVE

## 2019-09-06 LAB — VITAMIN D 25 HYDROXY (VIT D DEFICIENCY, FRACTURES): Vit D, 25-Hydroxy: 12.06 ng/mL — ABNORMAL LOW (ref 30–100)

## 2019-09-06 LAB — PREPARE RBC (CROSSMATCH)

## 2019-09-06 MED ORDER — COLLAGENASE 250 UNIT/GM EX OINT
TOPICAL_OINTMENT | Freq: Every day | CUTANEOUS | Status: DC
  Administered 2019-09-07: 1 via TOPICAL
  Filled 2019-09-06 (×2): qty 30

## 2019-09-06 MED ORDER — VANCOMYCIN HCL 1250 MG/250ML IV SOLN: 1250.0000 mg | INTRAVENOUS | Status: DC

## 2019-09-06 MED ORDER — SODIUM CHLORIDE 0.9% IV SOLUTION: Freq: Once | INTRAVENOUS | Status: AC

## 2019-09-06 MED ORDER — DEXTROSE-NACL 5-0.45 % IV SOLN: INTRAVENOUS | Status: DC

## 2019-09-06 MED ORDER — LACTATED RINGERS IV BOLUS
250.0000 mL | Freq: Once | INTRAVENOUS | Status: AC
  Administered 2019-09-06: 06:00:00 250 mL via INTRAVENOUS

## 2019-09-06 MED ORDER — VANCOMYCIN HCL 1500 MG/300ML IV SOLN
1500.0000 mg | Freq: Once | INTRAVENOUS | Status: AC
  Administered 2019-09-06: 13:00:00 1500 mg via INTRAVENOUS
  Filled 2019-09-06: qty 300

## 2019-09-06 NOTE — Progress Notes (Signed)
Chaplain engaged in initial visit with Christie [name] and her daughter, Christie [name].  Christie [name] was not responsive at this time, but chaplain was able to have a meaningful conversation with Christie [name].  Chaplain worked to complete a Engineer, site as Christie [name]'s primary caregiver and make sure she knows she is heard and seen, and learn more about Christie [name].  Christie [name] mentioned that Christie [name] does a number of word puzzles daily to keep her mind strong and she was doing that up until her noticing that her mother was being less responsive.    Chaplain offered support and will continue to follow-up.

## 2019-09-06 NOTE — Progress Notes (Signed)
Received phone consent from daughter Jolane Bankhead to give her mom blood products. Witnessed by myself and Building surveyor. Consent placed in chart.

## 2019-09-06 NOTE — Progress Notes (Signed)
Paged MD [facility] about critical lab of hgb 6.6

## 2019-09-06 NOTE — Progress Notes (Signed)
PT Cancellation Note  Patient Details Name: Christie [name] MRN: [id number] DOB: 27-Sep-1920   Cancelled Treatment:    Reason Eval/Treat Not Completed: Patient's level of consciousness Pt currently lethargic and not following commands. This PT is unable to arouse. PT notes some reflexive movements of UE but no purposeful response to PT commands. PT will attempt to follow up when the pt is more alert and better able to participate in PT evaluation.   Arlyss Gandy 09/06/2019, 11:51 AM

## 2019-09-06 NOTE — Progress Notes (Signed)
Pharmacy Antibiotic Note  Christie [name] is a 86 y.o. female admitted on 09/05/2019 with confusion.  Pharmacy has been consulted for vancomycin dosing.  Patient with positive U/A started on ceftriaxone on admit, cultures shows multiple species. Blood is no growth. No fevers noted overnight, wbc normal. Orders to add vancomycin today.   Plan: Vancomycin 1250 mg IV Q 48 hrs. Goal AUC 400-550. Expected AUC: 477 SCr used: 1.27 Continue ceftriaxone    Height: 5' (152.4 cm) Weight: 148 lb 5.9 oz (67.3 kg) IBW/kg (Calculated) : 45.5  Temp (24hrs), Avg:95.1 F (35.1 C), Min:90 F (32.2 C), Max:99 F (37.2 C)  Recent Labs  Lab 09/05/19 1257 09/05/19 1457 09/05/19 1713 09/05/19 2346 09/06/19 0235  WBC 4.6  --   --   --  6.0  CREATININE 1.40*  --   --   --  1.27*  LATICACIDVEN 3.8* 3.8* 3.3* 3.4*  --     Estimated Creatinine Clearance: 22.2 mL/min (A) (by C-G formula based on SCr of 1.27 mg/dL (H)).    Allergies  Allergen Reactions  . Codeine     Thank you for allowing pharmacy to be a part of this patient's care.  Sheppard Coil PharmD., BCPS Clinical Pharmacist 09/06/2019 2:25 PM

## 2019-09-06 NOTE — Consult Note (Signed)
WOC Nurse Consult Note: Reason for Consult: Sacrum, heel, ankle Wound type: Stage 3 Pressure Injury: sacrum Full thickness fissures at anus Unstageable pressure injury; left medial malleolar; did not find any heel ulcerations  Pressure Injury POA: Yes Measurement: Sacrum; 0.3cm x 0.1cm x 0.1cm; pink and moist Left medial malleolar; 2.5cm x 2.0cm x 0.2cm; 100% yellow thick slough Wound bed: see above  Drainage (amount, consistency, odor) minimal from ankle wound, no odor Periwound:edematous LEs bilaterally  Dressing procedure/placement/frequency: Enzymatic debridement ointment to the left ankle wound daily.  Prevalon boots bilaterally to offload vunerable heels Moisture barrier cream to protect affected areas around the anus Silicon foam to protect sacral PI.   Discussed POC with patient and bedside nurse.  Re consult if needed, will not follow at this time. Thanks  Tavis Kring [employer], RN,CWOCN, CNS, CWON-AP [phone])

## 2019-09-06 NOTE — Progress Notes (Signed)
Patient has had altered LOC since arrival to the ED. Night nurse reported patient altered all night and had apnic episode in the early   Daughter reported weakness started Saturday, Patient with UTI and being treated.  Waiting for blood bank to match blood for transfusion.  MEWS red due to LOC and respiratory rate.  Patient a DNR

## 2019-09-06 NOTE — Progress Notes (Signed)
PROGRESS NOTE    Christie [name]  HGD:[id number] DOB: June 25, 1921 DOA: 09/05/2019 PCP: Philip Aspen, Limmie Patricia, MD   Brief Narrative:  86 year old who lives at home has a history of HLD, DM2, osteoarthritis, anxiety has been complaining of weakness for past several days, poor oral intake, confusion and trouble getting words out.  In the ER she was evaluated for stroke for which CT of the head was negative but demonstrated mild cerebral atrophy, UA showed urinary tract infection.  Admitted for sepsis secondary to urinary tract infection   Assessment & Plan:   Principal Problem:   Sepsis secondary to UTI [facility]) Active Problems:   Dyslipidemia   Anxiety state   Essential hypertension   Controlled type 2 diabetes with neuropathy (HCC)   Hypotension   Confusion   Generalized weakness   Lactic acidosis   Anemia in other chronic diseases classified elsewhere   Thrombocytopenia (HCC)   Sepsis (HCC)  Sepsis secondary to urinary tract infection, POA -Cultures have been drawn, currently negative.  Trend lactic acid -Empirically started on Rocephin.  Add vancomycin.  Gentle hydration -Monitor urine output. -Chest x-ray-negative  Generalized weakness/confusion and mild aphasia Acute metabolic encephalopathy -CT of the head negative.  Could be metabolic in nature but given an episode of hypotension high risk of CVA, order MRI brain -Check TSH, B12, folate. Q6Hrs accuchecks.   Anemia, unknown etiology Thrombocytopenia, unknown etiology -Type and screen ordered, 1 unit PRBC transfusion -Check iron studies, B12, folate -Hemoccult. -Monitor platelets. -Daughter does not want any procedure interventions at this time  Acute kidney injury -Baseline creatinine 0.8.  Admission creatinine 1.4.  Avoid nephrotoxic drugs, getting gentle hydration.  Monitor for now.  If necessary will pursue further work-up  Bilateral lower extremity swelling -Venous stasis?.  Echocardiogram ordered.  Poor  nutritional status as well and lead to this and immobility  Generalized weakness with failure to thrive Moderate protein calorie malnutrition -We will order nutrition consult.  Encourage oral intake -PT/OT-once she is mentally doing better  GERD -PPI  Stage III sacral ulcer, unstageable left medial malleolar ulcer-management for wound care and nursing team.  Given her advanced age, comorbidities, she is a very high risk patient. Overall poor long-term prognosis, I have encouraged daughter to speak with palliative care and also discuss amongst siblings regarding long-term goals of care.  Daughter will follow this through with her family  DVT prophylaxis: SCDs Code Status: DNR Family Communication: Spoke with both the daughters.  One of them in person. Disposition Plan:   Patient From= home  Patient Anticipated D/C place= to be determined  Barriers= currently very confused and minimally responsive.  Unsafe for discharge   Subjective: When I arrived at bedside this morning patient was minimally responsive and nonrebreather.  She was saturating 100% therefore removed nonrebreather and she continued to saturate 100% but had to sternal rub She was grossly moving all the extremities but no purposeful waking up or following commands.  No meaningful conversation.  Vital signs are stable.  I came back to her bedside when daughter arrived who told me that prior to this patient is usually awake alert but usually uses walker or wheelchair.  Minimal ambulation.  We had an extensive discussion regarding her goals of care and prognosis along with ongoing medical conditions.  She was in agreement with management as stated above.  Review of Systems Otherwise negative except as per HPI, including: Difficult to obtain given her mentation  Examination:  General exam: Lethargic and grossly moving all  extremities but no purposeful movement at this time. Respiratory system: Bilateral diffuse  rhonchi Cardiovascular system: Normal sinus rhythm Gastrointestinal system: Abdomen is nondistended, soft and nontender. No organomegaly or masses felt. Normal bowel sounds heard. Central nervous system: Difficult to assess.  Grossly moving all extremities Extremities: Some contractures in upper extremities noted Skin: No rashes, lesions or ulcers Psychiatry: Poor judgment and insight  Stage III sacral ulcer  Objective: Vitals:   09/06/19 0556 09/06/19 0600 09/06/19 0609 09/06/19 0615  BP: 113/60 (!) 120/107 138/79 134/79  Pulse: 98 99 100 100  Resp: (!) 24 (!) 22 20 (!) 29  Temp:      TempSrc:      SpO2: 100% 100% 100% 100%  Weight:      Height:        Intake/Output Summary (Last 24 hours) at 09/06/2019 0740 Last data filed at 09/06/2019 1610 Gross per 24 hour  Intake 264.17 ml  Output 50 ml  Net 214.17 ml   Filed Weights   09/05/19 2332  Weight: 67.3 kg     Data Reviewed:   CBC: Recent Labs  Lab 09/05/19 1257 09/06/19 0235  WBC 4.6 6.0  NEUTROABS 2.7  --   HGB 7.3* 6.6*  HCT 26.1* 22.9*  MCV 87.0 84.5  PLT 68* 53*   Basic Metabolic Panel: Recent Labs  Lab 09/05/19 1257 09/06/19 0235  NA 145 147*  K 4.9 5.3*  CL 117* 121*  CO2 19* 19*  GLUCOSE 184* 73  BUN 25* 25*  CREATININE 1.40* 1.27*  CALCIUM 8.6* 8.1*   GFR: Estimated Creatinine Clearance: 22.2 mL/min (A) (by C-G formula based on SCr of 1.27 mg/dL (H)). Liver Function Tests: Recent Labs  Lab 09/05/19 1257  AST 28  ALT 24  ALKPHOS 63  BILITOT 0.3  PROT 6.3*  ALBUMIN 2.7*   No results for input(s): LIPASE, AMYLASE in the last 168 hours. No results for input(s): AMMONIA in the last 168 hours. Coagulation Profile: Recent Labs  Lab 09/05/19 1625  INR 1.4*   Cardiac Enzymes: No results for input(s): CKTOTAL, CKMB, CKMBINDEX, TROPONINI in the last 168 hours. BNP (last 3 results) No results for input(s): PROBNP in the last 8760 hours. HbA1C: Recent Labs    09/05/19 1713  HGBA1C  9.3*   CBG: Recent Labs  Lab 09/05/19 1250 09/05/19 1936 09/06/19 0619  GLUCAP 173* 110* 88   Lipid Profile: No results for input(s): CHOL, HDL, LDLCALC, TRIG, CHOLHDL, LDLDIRECT in the last 72 hours. Thyroid Function Tests: Recent Labs    09/05/19 1257  TSH 3.685   Anemia Panel: No results for input(s): VITAMINB12, FOLATE, FERRITIN, TIBC, IRON, RETICCTPCT in the last 72 hours. Sepsis Labs: Recent Labs  Lab 09/05/19 1257 09/05/19 1457 09/05/19 1713 09/05/19 2346  LATICACIDVEN 3.8* 3.8* 3.3* 3.4*    Recent Results (from the past 240 hour(s))  Respiratory Panel by RT PCR (Flu A&B, Covid) - Nasopharyngeal Swab     Status: None   Collection Time: 09/05/19  2:30 PM   Specimen: Nasopharyngeal Swab  Result Value Ref Range Status   SARS Coronavirus 2 by RT PCR NEGATIVE NEGATIVE Final    Comment: (NOTE) SARS-CoV-2 target nucleic acids are NOT DETECTED. The SARS-CoV-2 RNA is generally detectable in upper respiratoy specimens during the acute phase of infection. The lowest concentration of SARS-CoV-2 viral copies this assay can detect is 131 copies/mL. A negative result does not preclude SARS-Cov-2 infection and should not be used as the sole basis for treatment or  other patient management decisions. A negative result may occur with  improper specimen collection/handling, submission of specimen other than nasopharyngeal swab, presence of viral mutation(s) within the areas targeted by this assay, and inadequate number of viral copies (<131 copies/mL). A negative result must be combined with clinical observations, patient history, and epidemiological information. The expected result is Negative. Fact Sheet for Patients:  https://www.moore.com/ Fact Sheet for Healthcare Providers:  https://www.young.biz/ This test is not yet ap proved or cleared by the [place] FDA and  has been authorized for detection and/or diagnosis of SARS-CoV-2  by FDA under an Emergency Use Authorization (EUA). This EUA will remain  in effect (meaning this test can be used) for the duration of the COVID-19 declaration under Section 564(b)(1) of the Act, 21 U.S.C. section 360bbb-3(b)(1), unless the authorization is terminated or revoked sooner.    Influenza A by PCR NEGATIVE NEGATIVE Final   Influenza B by PCR NEGATIVE NEGATIVE Final    Comment: (NOTE) The Xpert Xpress SARS-CoV-2/FLU/RSV assay is intended as an aid in  the diagnosis of influenza from Nasopharyngeal swab specimens and  should not be used as a sole basis for treatment. Nasal washings and  aspirates are unacceptable for Xpert Xpress SARS-CoV-2/FLU/RSV  testing. Fact Sheet for Patients: https://www.moore.com/ Fact Sheet for Healthcare Providers: https://www.young.biz/ This test is not yet approved or cleared by the [place] FDA and  has been authorized for detection and/or diagnosis of SARS-CoV-2 by  FDA under an Emergency Use Authorization (EUA). This EUA will remain  in effect (meaning this test can be used) for the duration of the  Covid-19 declaration under Section 564(b)(1) of the Act, 21  U.S.C. section 360bbb-3(b)(1), unless the authorization is  terminated or revoked. Performed at [facility], [address] [place], [place] [address]   MRSA PCR Screening     Status: None   Collection Time: 09/05/19 11:25 PM   Specimen: Nasal Mucosa; Nasopharyngeal  Result Value Ref Range Status   MRSA by PCR NEGATIVE NEGATIVE Final    Comment:        The GeneXpert MRSA Assay (FDA approved for NASAL specimens only), is one component of a comprehensive MRSA colonization surveillance program. It is not intended to diagnose MRSA infection nor to guide or monitor treatment for MRSA infections. Performed at [facility], [address], [place], [place] [address]          Radiology Studies: CT Head Wo  Contrast  Result Date: 09/05/2019 CLINICAL DATA:  Focal neurologic deficit greater than 6 hours, right-sided weakness and right-sided facial droop for 4 days EXAM: CT HEAD WITHOUT CONTRAST TECHNIQUE: Contiguous axial images were obtained from the base of the skull through the vertex without intravenous contrast. COMPARISON:  None. FINDINGS: Brain: Hypodensities in the bilateral periventricular white matter are compatible with age-indeterminate small vessel ischemic changes, favor chronic. No other signs of acute infarct or hemorrhage. There is mild diffuse cerebral atrophy with ex vacuo dilatation of the lateral ventricles. Remaining midline structures are unremarkable. There are no acute extra-axial fluid collections. Vascular: No hyperdense vessel or unexpected calcification. Skull: Normal. Negative for fracture or focal lesion. Sinuses/Orbits: No acute finding. Other: None IMPRESSION: 1. No CT evidence of acute intracranial abnormality. 2. Age-indeterminate small vessel ischemic changes in the bilateral periventricular white matter. 3. Mild diffuse cerebral atrophy. Electronically Signed   By: Sharlet Salina M.D.   On: 09/05/2019 15:32   DG Chest Portable 1 View  Result Date: 09/05/2019 CLINICAL  DATA:  Chest pain. EXAM: PORTABLE CHEST 1 VIEW COMPARISON:  None. FINDINGS: Mild cardiomegaly is noted. No pneumothorax or pleural effusion is noted. Both lungs are clear. The visualized skeletal structures are unremarkable. IMPRESSION: No active disease. Electronically Signed   By: Marijo Conception M.D.   On: 09/05/2019 13:23        Scheduled Meds: . sodium chloride   Intravenous Once  . insulin aspart  0-6 Units Subcutaneous TID WC  . pantoprazole  40 mg Oral Daily   Continuous Infusions: . lactated ringers 50 mL/hr at 09/06/19 0413     LOS: 1 day   Time spent= 50 mins    Caila Cirelli Arsenio Loader, MD [facility]  If 7PM-7AM, please contact night-coverage  09/06/2019, 7:40 AM

## 2019-09-06 NOTE — Progress Notes (Signed)
Patient mildly more stimulated as the day went on.  Responded to daughter's voice by turning head her way. Repeatedly removing pulse ox, pulling up blankets.  Vital signs remain stable

## 2019-09-06 NOTE — Progress Notes (Addendum)
Patient began gurgling this am. HOB elevated, mouth suctioned. Sternal rub done, unresponsive. Agonal breathing noted. Pupils were fixed and dilated. HR went down into the 30s, sats also 40s, nonrebreather placed. Patient has short time where she stopped breathing. When 02 nonrebreather placed and continued suctioning of the mouth, HR up to 104, sats 100% on nonrebreather mask, b/p 113/60, 21-25 breaths per minute. Patient still does not open her eyes.  250 bolus given of LR. Patient is currently gurgling,and grunting,  Keeps eyes closed. Attempts to follow command. In posture with arms raised to chest.  120/107. Cycling q vitals. Made triad aware of patient's status. No new orders.

## 2019-09-07 ENCOUNTER — Inpatient Hospital Stay (HOSPITAL_COMMUNITY): Payer: Medicare Other

## 2019-09-07 DIAGNOSIS — D696 Thrombocytopenia, unspecified: Secondary | ICD-10-CM

## 2019-09-07 DIAGNOSIS — R4701 Aphasia: Secondary | ICD-10-CM

## 2019-09-07 DIAGNOSIS — I10 Essential (primary) hypertension: Secondary | ICD-10-CM

## 2019-09-07 LAB — CBC
HCT: 26.4 % — ABNORMAL LOW (ref 36.0–46.0)
Hemoglobin: 8.1 g/dL — ABNORMAL LOW (ref 12.0–15.0)
MCH: 25.5 pg — ABNORMAL LOW (ref 26.0–34.0)
MCHC: 30.7 g/dL (ref 30.0–36.0)
MCV: 83 fL (ref 80.0–100.0)
Platelets: 61 10*3/uL — ABNORMAL LOW (ref 150–400)
RBC: 3.18 MIL/uL — ABNORMAL LOW (ref 3.87–5.11)
RDW: 17.3 % — ABNORMAL HIGH (ref 11.5–15.5)
WBC: 7.3 10*3/uL (ref 4.0–10.5)
nRBC: 1.4 % — ABNORMAL HIGH (ref 0.0–0.2)

## 2019-09-07 LAB — COMPREHENSIVE METABOLIC PANEL
ALT: 26 U/L (ref 0–44)
AST: 44 U/L — ABNORMAL HIGH (ref 15–41)
Albumin: 2.3 g/dL — ABNORMAL LOW (ref 3.5–5.0)
Alkaline Phosphatase: 55 U/L (ref 38–126)
Anion gap: 12 (ref 5–15)
BUN: 24 mg/dL — ABNORMAL HIGH (ref 8–23)
CO2: 20 mmol/L — ABNORMAL LOW (ref 22–32)
Calcium: 8.2 mg/dL — ABNORMAL LOW (ref 8.9–10.3)
Chloride: 115 mmol/L — ABNORMAL HIGH (ref 98–111)
Creatinine, Ser: 1.14 mg/dL — ABNORMAL HIGH (ref 0.44–1.00)
GFR calc Af Amer: 47 mL/min — ABNORMAL LOW (ref >60)
GFR calc non Af Amer: 41 mL/min — ABNORMAL LOW (ref >60)
Glucose, Bld: 121 mg/dL — ABNORMAL HIGH (ref 70–99)
Potassium: 4.9 mmol/L (ref 3.5–5.1)
Sodium: 147 mmol/L — ABNORMAL HIGH (ref 135–145)
Total Bilirubin: 0.8 mg/dL (ref 0.3–1.2)
Total Protein: 5.9 g/dL — ABNORMAL LOW (ref 6.5–8.1)

## 2019-09-07 LAB — BPAM RBC
Blood Product Expiration Date: 202102172359
ISSUE DATE / TIME: 202102101055
Unit Type and Rh: 5100

## 2019-09-07 LAB — URINE CULTURE: Culture: <10000 — AB

## 2019-09-07 LAB — GLUCOSE, CAPILLARY
Glucose-Capillary: 134 mg/dL — ABNORMAL HIGH (ref 70–99)
Glucose-Capillary: 186 mg/dL — ABNORMAL HIGH (ref 70–99)
Glucose-Capillary: 249 mg/dL — ABNORMAL HIGH (ref 70–99)

## 2019-09-07 LAB — FOLATE RBC
Folate, Hemolysate: 310 ng/mL
Folate, RBC: 1260 ng/mL (ref >498)
Hematocrit: 24.6 % — ABNORMAL LOW (ref 34.0–46.6)

## 2019-09-07 LAB — TYPE AND SCREEN
ABO/RH(D): O POS
Antibody Screen: NEGATIVE
Unit division: 0

## 2019-09-07 LAB — MAGNESIUM: Magnesium: 1.3 mg/dL — ABNORMAL LOW (ref 1.7–2.4)

## 2019-09-07 MED ORDER — ALBUTEROL SULFATE (2.5 MG/3ML) 0.083% IN NEBU
2.5000 mg | INHALATION_SOLUTION | RESPIRATORY_TRACT | Status: DC | PRN
  Administered 2019-09-07 – 2019-09-10 (×6): 2.5 mg via RESPIRATORY_TRACT
  Filled 2019-09-07 (×6): qty 3

## 2019-09-07 MED ORDER — LORAZEPAM 2 MG/ML IJ SOLN: 0.5000 mg | Freq: Once | INTRAMUSCULAR | Status: DC

## 2019-09-07 MED ORDER — MAGNESIUM SULFATE 2 GM/50ML IV SOLN
2.0000 g | Freq: Once | INTRAVENOUS | Status: AC
  Administered 2019-09-07: 16:00:00 2 g via INTRAVENOUS
  Filled 2019-09-07: qty 50

## 2019-09-07 MED ORDER — SODIUM CHLORIDE 0.9 % IV SOLN
1.0000 g | INTRAVENOUS | Status: DC
  Administered 2019-09-07 – 2019-09-10 (×3): 1 g via INTRAVENOUS
  Filled 2019-09-07 (×2): qty 10
  Filled 2019-09-07: qty 1
  Filled 2019-09-07: qty 10
  Filled 2019-09-07: qty 1

## 2019-09-07 MED ORDER — DEXTROSE 5 % IV SOLN: INTRAVENOUS | Status: DC

## 2019-09-07 NOTE — Evaluation (Signed)
Occupational Therapy Evaluation Patient Details Name: Christie [name] MRN: [id number] DOB: 11-19-1920 Today's Date: 09/07/2019    History of Present Illness Pt is a 86 year old woman admitted on 09/06/19 with weakness, confusion, difficulty talking and poor PO intake, MRI negative for stroke, + UTI. PMH: DM with neuropathy, HLD, OA, anxiety.   Clinical Impression   Pt ambulates with a walker and is assisted for bathing and dressing, but can self feed at home where she resides with her daughter. Pt presents with expressive difficulties, restlessness, weakness and impaired cognition. She currently requires total assist for ADL and max to total assist for bed mobility. Pt was unable to stand. Recommending further rehab in SNF prior to return home. Will follow acutely.    Follow Up Recommendations  SNF;Supervision/Assistance - 24 hour    Equipment Recommendations  Other (comment)(defer to next venue)    Recommendations for Other Services       Precautions / Restrictions Precautions Precautions: Fall Precaution Comments: pt with mitts, pulls at lines Restrictions Weight Bearing Restrictions: No      Mobility Bed Mobility Overal bed mobility: Needs Assistance Bed Mobility: Supine to Sit;Sit to Supine     Supine to sit: Total assist Sit to supine: Max assist      Transfers                 General transfer comment: unable to stand    Balance Overall balance assessment: Needs assistance   Sitting balance-Leahy Scale: Fair                                     ADL either performed or assessed with clinical judgement   ADL                                         General ADL Comments: requires total assist     Vision   Additional Comments: vision appears grossly intact     Perception     Praxis      Pertinent Vitals/Pain Pain Assessment: Faces Faces Pain Scale: No hurt     Hand Dominance     Extremity/Trunk Assessment  Upper Extremity Assessment Upper Extremity Assessment: Generalized weakness(arthritic changes in hands)   Lower Extremity Assessment Lower Extremity Assessment: Defer to PT evaluation       Communication Communication Communication: Expressive difficulties   Cognition Arousal/Alertness: Awake/alert Behavior During Therapy: Restless Overall Cognitive Status: Impaired/Different from baseline Area of Impairment: Following commands;Safety/judgement;Problem solving                       Following Commands: (not following commands) Safety/Judgement: Decreased awareness of safety;Decreased awareness of deficits   Problem Solving: Decreased initiation;Difficulty sequencing     General Comments       Exercises     Shoulder Instructions      Home Living Family/patient expects to be discharged to:: Unsure                                        Prior Functioning/Environment Level of Independence: Needs assistance  Gait / Transfers Assistance Needed: ambulated with a rollator and supervision per RN ADL's / Homemaking Assistance Needed: self fed, assisted for  bathing and dressing, likes to do word searches            OT Problem List: Decreased strength;Decreased activity tolerance;Impaired balance (sitting and/or standing);Decreased cognition;Decreased safety awareness      OT Treatment/Interventions: Self-care/ADL training;DME and/or AE instruction;Cognitive remediation/compensation;Patient/family education;Balance training;Therapeutic activities    OT Goals(Current goals can be found in the care plan section) Acute Rehab OT Goals Patient Stated Goal: pt's face lit up when asked if she wanted coffee OT Goal Formulation: Patient unable to participate in goal setting Time For Goal Achievement: 09/21/19 Potential to Achieve Goals: Good ADL Goals Pt Will Perform Eating: sitting;with min assist Pt Will Perform Grooming: with min assist;sitting Pt Will  Perform Upper Body Dressing: with mod assist;sitting Pt Will Transfer to Toilet: with mod assist;ambulating;bedside commode Additional ADL Goal #1: Pt will follow one step commands 50% of time. Additional ADL Goal #2: Pt will perform bed mobility with moderate assistance in preparation for ADL.  OT Frequency: Min 2X/week   Barriers to D/C:            Co-evaluation              AM-PAC OT "6 Clicks" Daily Activity     Outcome Measure Help from another person eating meals?: Total Help from another person taking care of personal grooming?: Total Help from another person toileting, which includes using toliet, bedpan, or urinal?: Total Help from another person bathing (including washing, rinsing, drying)?: Total Help from another person to put on and taking off regular upper body clothing?: Total Help from another person to put on and taking off regular lower body clothing?: Total 6 Click Score: 6   End of Session Nurse Communication: Mobility status  Activity Tolerance: Patient tolerated treatment well Patient left: in bed;with call bell/phone within reach  OT Visit Diagnosis: Muscle weakness (generalized) (M62.81);Other symptoms and signs involving cognitive function                Time: 2993-7169 OT Time Calculation (min): 21 min Charges:  OT General Charges $OT Visit: 1 Visit OT Evaluation $OT Eval Moderate Complexity: 1 Mod  Martie Round, OTR/L Acute Rehabilitation Services Pager: [phone] Office: [phone]  Evern Bio 09/07/2019, 9:48 AM

## 2019-09-07 NOTE — TOC Progression Note (Addendum)
Transition of Care [facility]) - Progression Note    Patient Details  Name: Christie [name] MRN: [id number] Date of Birth: Sep 22, 1920  Transition of Care [facility]) CM/SW Contact  Leone Haven, RN Phone Number: 09/07/2019, 3:56 PM  Clinical Narrative:    NCM spoke with daughter , she states she is not sure if she wants mom to go home or to snf yet, she wants to see if she improves with the physical therapy.  NCM left the list of HH agencies with Britta Mccreedy RN to give to  daughter in the room for her to look over.  She also states she is not ready to order any DME because she is not sure of dispo right now.          Expected Discharge Plan and Services                                                 Social Determinants of Health (SDOH) Interventions    Readmission Risk Interventions No flowsheet data found.

## 2019-09-07 NOTE — Plan of Care (Signed)

## 2019-09-07 NOTE — Evaluation (Signed)
Clinical/Bedside Swallow Evaluation Patient Details  Name: Amore Ackman MRN: [id number] Date of Birth: [birth year]  Today's Date: 09/07/2019 Time: SLP Start Time (ACUTE ONLY): 1200 SLP Stop Time (ACUTE ONLY): 1220 SLP Time Calculation (min) (ACUTE ONLY): 20 min  Past Medical History:  Past Medical History:  Diagnosis Date  . ANXIETY 01/31/2007  . DIABETES MELLITUS, TYPE II 01/31/2007  . Gastric ulcer   . Headache(784.0) 01/31/2007  . HYPERLIPIDEMIA 01/31/2007  . HYPERTENSION 01/31/2007  . HYPOKALEMIA, HX OF 02/09/2008  . OSTEOARTHRITIS 01/31/2007   Past Surgical History:  Past Surgical History:  Procedure Laterality Date  . ABDOMINAL HYSTERECTOMY    . CATARACT EXTRACTION Left   . ESOPHAGOGASTRODUODENOSCOPY N/A 07/02/2014   Procedure: ESOPHAGOGASTRODUODENOSCOPY (EGD);  Surgeon: Hilarie Fredrickson, MD;  Location: [facility];  Service: Endoscopy;  Laterality: N/A;   HPI:  86 year old female with history of hyperlipidemia, diabetes mellitus type 2, osteoarthritis, anxiety presented with weakness for past several days, poor oral intake, confusion and trouble getting words out.  In the ER, CT of the head was negative for acute abnormality.  UA was suggestive of UTI.  She was admitted for sepsis secondary to urinary tract infection. Chest x-ray on presentation was negative but repeat chest x-ray on 09/06/2019 had shown possible left basilar atelectasis versus infiltrate.   Assessment / Plan / Recommendation Clinical Impression  Daughter reports pt was able to feed herself and consume a regular diet prior to admit. She has been lethargic, but at the time of assessment is waking up. With verbal and tactile cueing pt was able to self feed with max assist to grip cup and guide spoon to mouth. No signs of aspiration. No cough, vocal quality remained dry throughout. Pt did have instances of brief oral holding that daughter noticed and cued her mother on. Pt may initaite a puree diet and thin liquids. Hopeful for  upgrade if mentation improves further SLP Visit Diagnosis: Dysphagia, unspecified (R13.10)    Aspiration Risk  Mild aspiration risk    Diet Recommendation Dysphagia 1 (Puree);Thin liquid   Liquid Administration via: Cup;Straw Medication Administration: Crushed with puree Supervision: Staff to assist with self feeding;Full supervision/cueing for compensatory strategies Compensations: Slow rate;Small sips/bites;Minimize environmental distractions Postural Changes: Seated upright at 90 degrees    Other  Recommendations     Follow up Recommendations 24 hour supervision/assistance      Frequency and Duration min 2x/week  2 weeks       Prognosis Prognosis for Safe Diet Advancement: Good      Swallow Study   General HPI: 86 year old female with history of hyperlipidemia, diabetes mellitus type 2, osteoarthritis, anxiety presented with weakness for past several days, poor oral intake, confusion and trouble getting words out.  In the ER, CT of the head was negative for acute abnormality.  UA was suggestive of UTI.  She was admitted for sepsis secondary to urinary tract infection. Chest x-ray on presentation was negative but repeat chest x-ray on 09/06/2019 had shown possible left basilar atelectasis versus infiltrate. Type of Study: Bedside Swallow Evaluation Diet Prior to this Study: NPO Temperature Spikes Noted: No Respiratory Status: Room air History of Recent Intubation: No Behavior/Cognition: Alert;Requires cueing Oral Cavity Assessment: Within Functional Limits Oral Care Completed by SLP: No Oral Cavity - Dentition: Poor condition;Missing dentition Vision: Functional for self-feeding Self-Feeding Abilities: Needs assist Patient Positioning: Upright in bed Baseline Vocal Quality: Normal Volitional Cough: Cognitively unable to elicit    Oral/Motor/Sensory Function Overall Oral Motor/Sensory Function: Within functional limits  Ice Chips Ice chips: Not tested   Thin Liquid  Thin Liquid: Within functional limits Presentation: Straw    Nectar Thick Nectar Thick Liquid: Not tested   Honey Thick Honey Thick Liquid: Not tested   Puree Puree: Within functional limits Presentation: Spoon   Solid     Solid: Not tested     Herbie Baltimore, MA [place] Pager [phone] Office [phone]  Lynann Beaver 09/07/2019,1:16 PM

## 2019-09-07 NOTE — Evaluation (Signed)
Physical Therapy Evaluation Patient Details Name: Christie [name] MRN: [id number] DOB: 1920/11/24 Today's Date: 09/07/2019   History of Present Illness  86 yr old woman who lives at home with her family. She apparently began to be weak last Saturday. Since then she has had poor PO intake. Today she had trouble getting her words out and was very confused, found to have UTI. PMH significant for anxiety, DM II, gastric ulcer, HTN, HLD, OA  Clinical Impression  Pt presents to PT with deficits in cognition, communication, functional mobility, strength, power, balance, endurance, and safety awareness. Pt with significant expressive difficulties during session, able to ask PT what he was doing but most other attempts at speech are incomprehensible. Pt currently requires maxA for all mobility due to generalized weakness. Pt will benefit from continued acute PT POC to reduce falls risk and caregiver burden. Pt's daughter present at end of session and reporting the desire to take pt home at time of discharge with 24/7 assistance and HHPT.    Follow Up Recommendations Home health PT;Supervision/Assistance - 24 hour(family declining placement, wants to take pt home)    Equipment Recommendations  Hospital bed;Other (comment)(mechanical lift)    Recommendations for Other Services       Precautions / Restrictions Precautions Precautions: Fall Precaution Comments: pt with mitts, pulls at lines Restrictions Weight Bearing Restrictions: No      Mobility  Bed Mobility Overal bed mobility: Needs Assistance Bed Mobility: Supine to Sit;Sit to Supine     Supine to sit: Max assist Sit to supine: Max assist      Transfers Overall transfer level: Needs assistance Equipment used: 1 person hand held assist Transfers: Sit to/from Stand Sit to Stand: Max assist         General transfer comment: PT utilizing L knee block and support under BUE  Ambulation/Gait                Stairs             Wheelchair Mobility    Modified Rankin (Stroke Patients Only)       Balance Overall balance assessment: Needs assistance Sitting-balance support: Bilateral upper extremity supported;Feet supported Sitting balance-Leahy Scale: Fair Sitting balance - Comments: minG with BUE support of bed   Standing balance support: Bilateral upper extremity supported Standing balance-Leahy Scale: Zero Standing balance comment: maxA for static standing balance                             Pertinent Vitals/Pain Pain Assessment: Faces Faces Pain Scale: No hurt    Home Living Family/patient expects to be discharged to:: Private residence Living Arrangements: Children(daughter) Available Help at Discharge: Family;Available 24 hours/day Type of Home: House Home Access: Ramped entrance     Home Layout: One level Home Equipment: Wheelchair - [employer] - 2 wheels;Bedside commode      Prior Function Level of Independence: Needs assistance   Gait / Transfers Assistance Needed: ambulates with RW, minG assist from daughter, and wheelchair follow from daughter, very short household distances  ADL's / Homemaking Assistance Needed: self fed, assisted for bathing and dressing, likes to do word searches        Hand Dominance        Extremity/Trunk Assessment   Upper Extremity Assessment Upper Extremity Assessment: Defer to OT evaluation    Lower Extremity Assessment Lower Extremity Assessment: Generalized weakness    Cervical / Trunk Assessment Cervical / Trunk Assessment: Kyphotic  Communication   Communication: Expressive difficulties  Cognition Arousal/Alertness: Awake/alert Behavior During Therapy: Restless Overall Cognitive Status: Impaired/Different from baseline Area of Impairment: Following commands;Safety/judgement;Problem solving                       Following Commands: Follows one step commands with increased time(50% one step commands with  increased time) Safety/Judgement: Decreased awareness of safety;Decreased awareness of deficits   Problem Solving: Decreased initiation;Difficulty sequencing        General Comments General comments (skin integrity, edema, etc.): VSS on RA    Exercises     Assessment/Plan    PT Assessment Patient needs continued PT services  PT Problem List Decreased strength;Decreased activity tolerance;Decreased balance;Decreased mobility;Decreased cognition;Decreased knowledge of use of DME;Decreased safety awareness;Decreased knowledge of precautions       PT Treatment Interventions DME instruction;Gait training;Functional mobility training;Therapeutic activities;Therapeutic exercise;Balance training;Neuromuscular re-education;Patient/family education;Wheelchair mobility training    PT Goals (Current goals can be found in the Care Plan section)  Acute Rehab PT Goals Patient Stated Goal: to improve mobility PT Goal Formulation: With patient/family Time For Goal Achievement: 09/21/19 Potential to Achieve Goals: Fair Additional Goals Additional Goal #1: Pt will mobilize in manual wheelchair for 25' with use of BUE and BLEs and minA    Frequency Min 2X/week   Barriers to discharge        Co-evaluation               AM-PAC PT "6 Clicks" Mobility  Outcome Measure Help needed turning from your back to your side while in a flat bed without using bedrails?: Total Help needed moving from lying on your back to sitting on the side of a flat bed without using bedrails?: Total Help needed moving to and from a bed to a chair (including a wheelchair)?: Total Help needed standing up from a chair using your arms (e.g., wheelchair or bedside chair)?: Total Help needed to walk in hospital room?: Total Help needed climbing 3-5 steps with a railing? : Total 6 Click Score: 6    End of Session   Activity Tolerance: Patient tolerated treatment well Patient left: in bed;with call bell/phone within  reach;with bed alarm set;with family/visitor present;Other (comment)(mitts reapplied) Nurse Communication: Mobility status PT Visit Diagnosis: Muscle weakness (generalized) (M62.81)    Time: 3845-3646 PT Time Calculation (min) (ACUTE ONLY): 18 min   Charges:   PT Evaluation $PT Eval Moderate Complexity: 1 Mod          Zenaida Niece, PT, DPT Acute Rehabilitation Pager: [phone]   Zenaida Niece 09/07/2019, 12:33 PM

## 2019-09-07 NOTE — Progress Notes (Addendum)
Patient ID: Christie [name], female   DOB: [birth year], 86 y.o.   MRN: [id number]  PROGRESS NOTE    Christie [name]  PFX:[id number] DOB: 1921/03/04 DOA: 09/05/2019 PCP: Christie [name], Christie Patricia, MD   Brief Narrative:  86 year old female with history of hyperlipidemia, diabetes mellitus type 2, osteoarthritis, anxiety presented with weakness for past several days, poor oral intake, confusion and trouble getting words out.  In the ER, CT of the head was negative for acute abnormality.  UA was suggestive of UTI.  She was admitted for sepsis secondary to urinary tract infection.  Assessment & Plan:   Sepsis: Present on admission, probably from UTI UTI -Urine cultures growing multiple species.  Blood cultures negative so far. -Patient was started on Rocephin and vancomycin was added.  We will continue Rocephin.  DC vancomycin. - IV fluids plan as below.  Influenza and COVID-19 PCR negative on presentation. -Chest x-ray on presentation was negative but repeat chest x-ray on 09/06/2019 had shown possible left basilar atelectasis versus infiltrate.  Acute metabolic encephalopathy Generalized weakness/confusion and mild aphasia Moderate protein calorie malnutrition -CT of the head was negative for acute abnormality.  MRI of the brain is pending. -Still very confused but mental status slightly improved as per nursing staff. -PT/OT/SLP evaluation -We will request palliative care evaluation.  Overall prognosis is guarded to poor.  If condition does not improve, consider hospice/comfort measures. -Vitamin B12 and TSH levels normal -Follow nutrition recommendations  Anemia and thrombocytopenia of unknown etiology -No signs of bleeding.  Monitor -Status post 1 unit packed red cell transfusion on 09/06/2019  Hypomagnesemia -Replace  Acute kidney injury -Baseline creatinine 0.8.  Presented with creatinine of 1.4.  Improving at 1.14 today.  Hypernatremia -Probably from poor oral intake.  Change IV  fluids to D5 at 75 cc an hour.  Monitor  GERD -Continue PPI  Stage III sacral ulcer, unstageable left medial medullary ulcer: Present on admission -Management as per wound care nursing team   DVT prophylaxis: SCDs Code Status: DNR Family Communication: called daughter/Christie [name] on phone on 09/06/2019 Disposition Plan: We will remain inpatient for now as patient is still very drowsy, not able to take orally and requiring IV antibiotics.  Consultants: We will request palliative care evaluation  Procedures: None  Antimicrobials:  Anti-infectives (From admission, onward)   Start     Dose/Rate Route Frequency Ordered Stop   09/08/19 1000  vancomycin (VANCOREADY) IVPB 1250 mg/250 mL     1,250 mg 166.7 mL/hr over 90 Minutes Intravenous Every 48 hours 09/06/19 1426     09/06/19 1230  vancomycin (VANCOREADY) IVPB 1500 mg/300 mL     1,500 mg 150 mL/hr over 120 Minutes Intravenous  Once 09/06/19 1148 09/06/19 1451   09/05/19 1330  cefTRIAXone (ROCEPHIN) 1 g in sodium chloride 0.9 % 100 mL IVPB     1 g 200 mL/hr over 30 Minutes Intravenous  Once 09/05/19 1329 09/05/19 1557       Subjective: Patient seen and examined at bedside.  She is very drowsy, wakes up slightly, hardly answers any questions.  As per nursing staff, her mental status is slightly improved since yesterday evening and also this morning compared to yesterday morning.  Little more responsive as per nursing staff.  No overnight fever or vomiting reported.  Objective: Vitals:   09/07/19 0341 09/07/19 0415 09/07/19 0613 09/07/19 0744  BP: 120/65 115/66  131/81  Pulse:    84  Resp:  (!) 25  18  Temp: (!) 97.5 F (36.4  C)   (!) 97.5 F (36.4 C)  TempSrc: Axillary   Axillary  SpO2:   97% 99%  Weight:      Height:        Intake/Output Summary (Last 24 hours) at 09/07/2019 1049 Last data filed at 09/07/2019 0700 Gross per 24 hour  Intake 1498.75 ml  Output 800 ml  Net 698.75 ml   Filed Weights   09/05/19 2332    Weight: 67.3 kg    Examination:  General exam: Elderly female lying in bed.  Very drowsy. Respiratory system: Bilateral decreased breath sounds at bases with scattered crackles.  Tachypneic Cardiovascular system: S1 & S2 heard, Rate controlled Gastrointestinal system: Abdomen is nondistended, soft and nontender. Normal bowel sounds heard. Extremities: No cyanosis, clubbing; trace lower extremity edema Central nervous system: Very drowsy, wakes up slightly, hardly answers any questions. No focal neurological deficits. Moving extremities Skin: No rashes, lesions or ulcers Psychiatry: Could not be assessed because of mental status    Data Reviewed: I have personally reviewed following labs and imaging studies  CBC: Recent Labs  Lab 09/05/19 1257 09/06/19 0235 09/06/19 1951 09/07/19 0321  WBC 4.6 6.0 7.9 7.3  NEUTROABS 2.7  --   --   --   HGB 7.3* 6.6* 8.0* 8.1*  HCT 26.1* 22.9* 25.9* 26.4*  MCV 87.0 84.5 82.0 83.0  PLT 68* 53* 40* 61*   Basic Metabolic Panel: Recent Labs  Lab 09/05/19 1257 09/06/19 0235 09/07/19 0619  NA 145 147* 147*  K 4.9 5.3* 4.9  CL 117* 121* 115*  CO2 19* 19* 20*  GLUCOSE 184* 73 121*  BUN 25* 25* 24*  CREATININE 1.40* 1.27* 1.14*  CALCIUM 8.6* 8.1* 8.2*  MG  --   --  1.3*   GFR: Estimated Creatinine Clearance: 24.7 mL/min (A) (by C-G formula based on SCr of 1.14 mg/dL (H)). Liver Function Tests: Recent Labs  Lab 09/05/19 1257 09/07/19 0619  AST 28 44*  ALT 24 26  ALKPHOS 63 55  BILITOT 0.3 0.8  PROT 6.3* 5.9*  ALBUMIN 2.7* 2.3*   No results for input(s): LIPASE, AMYLASE in the last 168 hours. No results for input(s): AMMONIA in the last 168 hours. Coagulation Profile: Recent Labs  Lab 09/05/19 1625  INR 1.4*   Cardiac Enzymes: No results for input(s): CKTOTAL, CKMB, CKMBINDEX, TROPONINI in the last 168 hours. BNP (last 3 results) No results for input(s): PROBNP in the last 8760 hours. HbA1C: Recent Labs     09/05/19 1713  HGBA1C 9.3*   CBG: Recent Labs  Lab 09/05/19 1936 09/06/19 0619 09/06/19 1148 09/06/19 1814 09/06/19 2339  GLUCAP 110* 88 73 95 89   Lipid Profile: No results for input(s): CHOL, HDL, LDLCALC, TRIG, CHOLHDL, LDLDIRECT in the last 72 hours. Thyroid Function Tests: Recent Labs    09/05/19 1257  TSH 3.685   Anemia Panel: Recent Labs    09/06/19 1602  VITAMINB12 589  FERRITIN 585*  TIBC 154*  IRON 53   Sepsis Labs: Recent Labs  Lab 09/05/19 1713 09/05/19 2346 09/06/19 1602 09/06/19 2035  PROCALCITON  --   --  <0.10  --   LATICACIDVEN 3.3* 3.4* 1.4 1.2    Recent Results (from the past 240 hour(s))  Culture, blood (routine x 2)     Status: None (Preliminary result)   Collection Time: 09/05/19 12:49 PM   Specimen: BLOOD  Result Value Ref Range Status   Specimen Description BLOOD RIGHT ANTECUBITAL  Final   Special Requests  Final    BOTTLES DRAWN AEROBIC AND ANAEROBIC Blood Culture adequate volume   Culture   Final    NO GROWTH < 24 HOURS Performed at [facility], [address], [place], [place] [address]    Report Status PENDING  Incomplete  Urine culture     Status: Abnormal   Collection Time: 09/05/19 12:57 PM   Specimen: Urine, Random  Result Value Ref Range Status   Specimen Description URINE, RANDOM  Final   Special Requests   Final    NONE Performed at [facility], [address] [place], [place] [address]    Culture MULTIPLE SPECIES PRESENT, SUGGEST RECOLLECTION (A)  Final   Report Status 09/06/2019 FINAL  Final  Respiratory Panel by RT PCR (Flu A&B, Covid) - Nasopharyngeal Swab     Status: None   Collection Time: 09/05/19  2:30 PM   Specimen: Nasopharyngeal Swab  Result Value Ref Range Status   SARS Coronavirus 2 by RT PCR NEGATIVE NEGATIVE Final    Comment: (NOTE) SARS-CoV-2 target nucleic acids are NOT DETECTED. The SARS-CoV-2 RNA is generally detectable in upper respiratoy specimens during the acute phase of  infection. The lowest concentration of SARS-CoV-2 viral copies this assay can detect is 131 copies/mL. A negative result does not preclude SARS-Cov-2 infection and should not be used as the sole basis for treatment or other patient management decisions. A negative result may occur with  improper specimen collection/handling, submission of specimen other than nasopharyngeal swab, presence of viral mutation(s) within the areas targeted by this assay, and inadequate number of viral copies (<131 copies/mL). A negative result must be combined with clinical observations, patient history, and epidemiological information. The expected result is Negative. Fact Sheet for Patients:  https://www.moore.com/ Fact Sheet for Healthcare Providers:  https://www.young.biz/ This test is not yet ap proved or cleared by the [place] FDA and  has been authorized for detection and/or diagnosis of SARS-CoV-2 by FDA under an Emergency Use Authorization (EUA). This EUA will remain  in effect (meaning this test can be used) for the duration of the COVID-19 declaration under Section 564(b)(1) of the Act, 21 U.S.C. section 360bbb-3(b)(1), unless the authorization is terminated or revoked sooner.    Influenza A by PCR NEGATIVE NEGATIVE Final   Influenza B by PCR NEGATIVE NEGATIVE Final    Comment: (NOTE) The Xpert Xpress SARS-CoV-2/FLU/RSV assay is intended as an aid in  the diagnosis of influenza from Nasopharyngeal swab specimens and  should not be used as a sole basis for treatment. Nasal washings and  aspirates are unacceptable for Xpert Xpress SARS-CoV-2/FLU/RSV  testing. Fact Sheet for Patients: https://www.moore.com/ Fact Sheet for Healthcare Providers: https://www.young.biz/ This test is not yet approved or cleared by the [place] FDA and  has been authorized for detection and/or diagnosis of SARS-CoV-2 by  FDA under  an Emergency Use Authorization (EUA). This EUA will remain  in effect (meaning this test can be used) for the duration of the  Covid-19 declaration under Section 564(b)(1) of the Act, 21  U.S.C. section 360bbb-3(b)(1), unless the authorization is  terminated or revoked. Performed at [facility], [address], [place], [place] [address]   Culture, blood (routine x 2)     Status: None (Preliminary result)   Collection Time: 09/05/19  2:35 PM   Specimen: BLOOD LEFT WRIST  Result Value Ref Range Status   Specimen Description BLOOD LEFT WRIST  Final   Special Requests   Final  BOTTLES DRAWN AEROBIC AND ANAEROBIC Blood Culture adequate volume   Culture   Final    NO GROWTH < 24 HOURS Performed at [facility], [address] [place], [place] [address]    Report Status PENDING  Incomplete  MRSA PCR Screening     Status: None   Collection Time: 09/05/19 11:25 PM   Specimen: Nasal Mucosa; Nasopharyngeal  Result Value Ref Range Status   MRSA by PCR NEGATIVE NEGATIVE Final    Comment:        The GeneXpert MRSA Assay (FDA approved for NASAL specimens only), is one component of a comprehensive MRSA colonization surveillance program. It is not intended to diagnose MRSA infection nor to guide or monitor treatment for MRSA infections. Performed at [facility], [address], [place], [place] [address]   Culture, Urine     Status: Abnormal   Collection Time: 09/06/19  1:00 PM   Specimen: Urine, Clean Catch  Result Value Ref Range Status   Specimen Description URINE, CLEAN CATCH  Final   Special Requests NONE  Final   Culture (A)  Final    <10,000 COLONIES/mL INSIGNIFICANT GROWTH Performed at [facility], [address], [place], [place] [address]    Report Status 09/07/2019 FINAL  Final         Radiology Studies: CT Head Wo Contrast  Result Date: 09/05/2019 CLINICAL DATA:  Focal neurologic deficit greater than 6 hours, right-sided weakness and  right-sided facial droop for 4 days EXAM: CT HEAD WITHOUT CONTRAST TECHNIQUE: Contiguous axial images were obtained from the base of the skull through the vertex without intravenous contrast. COMPARISON:  None. FINDINGS: Brain: Hypodensities in the bilateral periventricular white matter are compatible with age-indeterminate small vessel ischemic changes, favor chronic. No other signs of acute infarct or hemorrhage. There is mild diffuse cerebral atrophy with ex vacuo dilatation of the lateral ventricles. Remaining midline structures are unremarkable. There are no acute extra-axial fluid collections. Vascular: No hyperdense vessel or unexpected calcification. Skull: Normal. Negative for fracture or focal lesion. Sinuses/Orbits: No acute finding. Other: None IMPRESSION: 1. No CT evidence of acute intracranial abnormality. 2. Age-indeterminate small vessel ischemic changes in the bilateral periventricular white matter. 3. Mild diffuse cerebral atrophy. Electronically Signed   By: Sharlet Salina M.D.   On: 09/05/2019 15:32   DG Chest Port 1 View  Result Date: 09/06/2019 CLINICAL DATA:  Dyspnea. EXAM: PORTABLE CHEST 1 VIEW COMPARISON:  September 05, 2019. FINDINGS: Stable cardiomegaly. Atherosclerosis of thoracic aorta. No pneumothorax is noted. Interval development of left basilar atelectasis or infiltrate is noted with possible small pleural effusion. Right lung is unremarkable. Bony thorax is unremarkable. IMPRESSION: Aortic atherosclerosis. Interval development of left basilar atelectasis or infiltrate with possible small pleural effusion. Electronically Signed   By: Lupita Raider M.D.   On: 09/06/2019 09:41   DG Chest Portable 1 View  Result Date: 09/05/2019 CLINICAL DATA:  Chest pain. EXAM: PORTABLE CHEST 1 VIEW COMPARISON:  None. FINDINGS: Mild cardiomegaly is noted. No pneumothorax or pleural effusion is noted. Both lungs are clear. The visualized skeletal structures are unremarkable. IMPRESSION: No active  disease. Electronically Signed   By: Lupita Raider M.D.   On: 09/05/2019 13:23        Scheduled Meds: . collagenase   Topical Daily  . insulin aspart  0-6 Units Subcutaneous TID WC  . pantoprazole  40 mg Oral Daily   Continuous Infusions: . dextrose 5 % and 0.45%  NaCl 75 mL/hr at 09/07/19 0602  . [START ON 09/08/2019] vancomycin            Aline August, MD [facility] 09/07/2019, 10:49 AM

## 2019-09-07 NOTE — Progress Notes (Signed)
Palliative:  Chart reviewed. Set up meeting with daughter 16 am 2/12.  Gerlean Ren, DNP, AGNP-C Palliative Medicine Team Team Phone # [phone]  Pager # [phone]  NO CHARGE

## 2019-09-07 NOTE — Progress Notes (Addendum)
Patient noted to be removing her monitor leads repeatedly. Patient's upper body strength has improved. Mitts placed bilaterally. Vitals stable. Mouth suctioned frequently. Turned q 2 hours. Patient has opened her eyes for short pds of time. She turns her head to my voice and she also tracks me in the room. She continuously pulls at her blankets in bed as well. Currently has D51/2NS infusing at 66ml/hr. Edema noted to have gone down some bilateral upper and lower extremeties.   0600-Patient bathe and gown changed. Patient continues to move both of her arms around and spitting out. Mitts replaced. Wheezing and labored breathing noted. 02 sat 96-100%. Updated respiratory on prn breathing treatments.

## 2019-09-08 DIAGNOSIS — Z7189 Other specified counseling: Secondary | ICD-10-CM

## 2019-09-08 DIAGNOSIS — Z515 Encounter for palliative care: Secondary | ICD-10-CM

## 2019-09-08 LAB — COMPREHENSIVE METABOLIC PANEL
ALT: 23 U/L (ref 0–44)
AST: 56 U/L — ABNORMAL HIGH (ref 15–41)
Albumin: 2.1 g/dL — ABNORMAL LOW (ref 3.5–5.0)
Alkaline Phosphatase: 59 U/L (ref 38–126)
Anion gap: 9 (ref 5–15)
BUN: 24 mg/dL — ABNORMAL HIGH (ref 8–23)
CO2: 17 mmol/L — ABNORMAL LOW (ref 22–32)
Calcium: 7.8 mg/dL — ABNORMAL LOW (ref 8.9–10.3)
Chloride: 117 mmol/L — ABNORMAL HIGH (ref 98–111)
Creatinine, Ser: 1 mg/dL (ref 0.44–1.00)
GFR calc Af Amer: 55 mL/min — ABNORMAL LOW (ref >60)
GFR calc non Af Amer: 48 mL/min — ABNORMAL LOW (ref >60)
Glucose, Bld: 242 mg/dL — ABNORMAL HIGH (ref 70–99)
Potassium: 5.3 mmol/L — ABNORMAL HIGH (ref 3.5–5.1)
Sodium: 143 mmol/L (ref 135–145)
Total Bilirubin: 0.9 mg/dL (ref 0.3–1.2)
Total Protein: 5.7 g/dL — ABNORMAL LOW (ref 6.5–8.1)

## 2019-09-08 LAB — GLUCOSE, CAPILLARY
Glucose-Capillary: 225 mg/dL — ABNORMAL HIGH (ref 70–99)
Glucose-Capillary: 182 mg/dL — ABNORMAL HIGH (ref 70–99)
Glucose-Capillary: 266 mg/dL — ABNORMAL HIGH (ref 70–99)

## 2019-09-08 LAB — MAGNESIUM: Magnesium: 1.5 mg/dL — ABNORMAL LOW (ref 1.7–2.4)

## 2019-09-08 LAB — CBC
HCT: 26.5 % — ABNORMAL LOW (ref 36.0–46.0)
Hemoglobin: 8.2 g/dL — ABNORMAL LOW (ref 12.0–15.0)
MCH: 25.5 pg — ABNORMAL LOW (ref 26.0–34.0)
MCHC: 30.9 g/dL (ref 30.0–36.0)
MCV: 82.3 fL (ref 80.0–100.0)
Platelets: 32 10*3/uL — ABNORMAL LOW (ref 150–400)
RBC: 3.22 MIL/uL — ABNORMAL LOW (ref 3.87–5.11)
RDW: 17.8 % — ABNORMAL HIGH (ref 11.5–15.5)
WBC: 4.7 10*3/uL (ref 4.0–10.5)
nRBC: 2.3 % — ABNORMAL HIGH (ref 0.0–0.2)

## 2019-09-08 MED ORDER — MAGNESIUM SULFATE 2 GM/50ML IV SOLN
2.0000 g | Freq: Once | INTRAVENOUS | Status: AC
  Administered 2019-09-08: 09:00:00 2 g via INTRAVENOUS
  Filled 2019-09-08: qty 50

## 2019-09-08 MED ORDER — ORAL CARE MOUTH RINSE
15.0000 mL | Freq: Two times a day (BID) | OROMUCOSAL | Status: DC
  Administered 2019-09-09 – 2019-09-11 (×5): 15 mL via OROMUCOSAL

## 2019-09-08 NOTE — Progress Notes (Signed)
Physical Therapy Treatment Patient Details Name: Christie [name] MRN: [id number] DOB: 1921/02/21 Today's Date: 09/08/2019    History of Present Illness 86 yr old woman who lives at home with her family. She apparently began to be weak last Saturday. Since then she has had poor PO intake. Today she had trouble getting her words out and was very confused, found to have UTI. PMH significant for anxiety, DM II, gastric ulcer, HTN, HLD, OA    PT Comments    Patient seen for mobility progression. Pt requires max-total A +2 for all mobility. VSS on RA. Daughter present and now agreeable to SNF for further skilled PT services to maximize independence and safety with mobility.     Follow Up Recommendations  Supervision/Assistance - 24 hour;SNF     Equipment Recommendations  Other (comment)(TBD next venue)    Recommendations for Other Services       Precautions / Restrictions Precautions Precautions: Fall Restrictions Weight Bearing Restrictions: No    Mobility  Bed Mobility Overal bed mobility: Needs Assistance Bed Mobility: Supine to Sit     Supine to sit: Total assist;+2 for physical assistance     General bed mobility comments: assist for all aspects  Transfers Overall transfer level: Needs assistance Equipment used: Rolling walker (2 wheeled);2 person hand held assist Transfers: Sit to/from Stand Sit to Stand: Max assist;+2 physical assistance Stand pivot transfers: +2 physical assistance;Total assist       General transfer comment: cues for sequencing/technique and hand placement; hand over hand assist for hand placement on RW; assist to power up into standing for pivot with bilat LE blocked   Ambulation/Gait                 Stairs             Wheelchair Mobility    Modified Rankin (Stroke Patients Only)       Balance Overall balance assessment: Needs assistance Sitting-balance support: Bilateral upper extremity supported;Feet  supported Sitting balance-Leahy Scale: Fair Sitting balance - Comments: minG with BUE support of bed   Standing balance support: Bilateral upper extremity supported Standing balance-Leahy Scale: Zero Standing balance comment: maxA for static standing balance                            Cognition Arousal/Alertness: Awake/alert Behavior During Therapy: Restless Overall Cognitive Status: Impaired/Different from baseline Area of Impairment: Following commands;Safety/judgement;Problem solving                       Following Commands: (not following commands) Safety/Judgement: Decreased awareness of safety;Decreased awareness of deficits   Problem Solving: Decreased initiation;Difficulty sequencing;Requires tactile cues;Slow processing;Requires verbal cues        Exercises      General Comments General comments (skin integrity, edema, etc.): VSS on RA; pt wheezing       Pertinent Vitals/Pain Pain Assessment: Faces Faces Pain Scale: Hurts little more Pain Location: LEs Pain Descriptors / Indicators: Grimacing Pain Intervention(s): Monitored during session;Repositioned    Home Living                      Prior Function            PT Goals (current goals can now be found in the care plan section) Acute Rehab PT Goals Patient Stated Goal: to improve mobility Progress towards PT goals: Progressing toward goals    Frequency  Min 2X/week      PT Plan Discharge plan needs to be updated    Co-evaluation PT/OT/SLP Co-Evaluation/Treatment: Yes Reason for Co-Treatment: For patient/therapist safety;To address functional/ADL transfers PT goals addressed during session: Mobility/safety with mobility OT goals addressed during session: Proper use of Adaptive equipment and DME      AM-PAC PT "6 Clicks" Mobility   Outcome Measure  Help needed turning from your back to your side while in a flat bed without using bedrails?: Total Help needed  moving from lying on your back to sitting on the side of a flat bed without using bedrails?: Total Help needed moving to and from a bed to a chair (including a wheelchair)?: Total Help needed standing up from a chair using your arms (e.g., wheelchair or bedside chair)?: A Lot Help needed to walk in hospital room?: Total Help needed climbing 3-5 steps with a railing? : Total 6 Click Score: 7    End of Session   Activity Tolerance: Patient tolerated treatment well Patient left: with call bell/phone within reach;with family/visitor present;in chair;with chair alarm set Nurse Communication: Mobility status PT Visit Diagnosis: Muscle weakness (generalized) (M62.81)     Time: 3818-2993 PT Time Calculation (min) (ACUTE ONLY): 24 min  Charges:  $Gait Training: 8-22 mins                     Earney Navy, PTA Acute Rehabilitation Services Pager: [phone] Office: [phone]     Darliss Cheney 09/08/2019, 2:56 PM

## 2019-09-08 NOTE — Progress Notes (Signed)
Pt temperature now 98.7 F. Warming blanket removed,  was on for approx. five hours. Will continue to monitor.

## 2019-09-08 NOTE — Progress Notes (Signed)
PHARMACY - PHYSICIAN COMMUNICATION CRITICAL VALUE ALERT - BLOOD CULTURE IDENTIFICATION (BCID)  Lailany Riggenbach is an 86 y.o. female who presented to [facility] on 09/05/2019 with a chief complaint of weakness/UTI  Assessment:   1/2 blood cultures growing Gram positive rods--likely contaminate  Name of physician (or Provider) Contacted:  X Blount  Current antibiotics: Rocephin  Changes to prescribed antibiotics recommended:   None at this time  No results found for this or any previous visit.  Eddie Candle 09/08/2019  5:40 AM

## 2019-09-08 NOTE — Progress Notes (Signed)
  Speech Language Pathology Treatment: Dysphagia  Patient Details Name: Christie [name] MRN: [id number] DOB: 1921/02/15 Today's Date: 09/08/2019 Time: 8341-9622 SLP Time Calculation (min) (ACUTE ONLY): 12 min  Assessment / Plan / Recommendation Clinical Impression  Pt was seen for dysphagia treatment to assess diet tolerance and her ability to safely tolerate more advanced consistencies. She was more alert during this session and smiled and laughed with the SLP on multiple occasions. Nursing reported that the pt tolerated breakfast well and the pt reported that she like the food. Pt accepted limited trials during the session and frequently stated, "I don't want anymore". She tolerated puree solids and thin liquids without overt s/sx of aspiration. Trial of dysphagia 2 solids were provided but pt maintained these solids between her lips despite verbal and tactile cues to move it posteriorly. It is recommended that the current diet of dysphagia 1 solids and thin liquids be continued at this time. SLP will continue to follow pt.    HPI HPI: 86 year old female with history of hyperlipidemia, diabetes mellitus type 2, osteoarthritis, anxiety presented with weakness for past several days, poor oral intake, confusion and trouble getting words out.  In the ER, CT of the head was negative for acute abnormality.  UA was suggestive of UTI.  She was admitted for sepsis secondary to urinary tract infection. Chest x-ray on presentation was negative but repeat chest x-ray on 09/06/2019 had shown possible left basilar atelectasis versus infiltrate.      SLP Plan  Continue with current plan of care       Recommendations  Diet recommendations: Dysphagia 1 (puree);Thin liquid Liquids provided via: Straw;Cup Medication Administration: Crushed with puree Supervision: Trained caregiver to feed patient Compensations: Slow rate;Small sips/bites;Minimize environmental distractions Postural Changes and/or Swallow  Maneuvers: Seated upright 90 degrees;Upright 30-60 min after meal                Oral Care Recommendations: Oral care BID Follow up Recommendations: 24 hour supervision/assistance SLP Visit Diagnosis: Dysphagia, unspecified (R13.10) Plan: Continue with current plan of care       Trameka Dorough I. Vear Clock, MS, CCC-SLP Acute Rehabilitation Services Office number [phone] Pager [phone]                Scheryl Marten 09/08/2019, 10:37 AM

## 2019-09-08 NOTE — Progress Notes (Signed)
Administrator, sports received referral for pt to participate in [facility] palliative program once pt discharges. Called pt's Daughter, [name], to confirm interest with no answer. Liaison to follow while in hospital to alert [facility] palliative team who will then outreach family/SNF to arrange Palliative admission visit.   Please do not hesitate to call with any questions and thank you for the referral.   Trena Platt, RN [facility] Liaison  [phone]

## 2019-09-08 NOTE — Progress Notes (Signed)
Patient ID: Christie [name], female   DOB: [birth year], 86 y.o.   MRN: [id number]  PROGRESS NOTE    Rhyse Skowron  MGQ:[id number] DOB: 1920-10-25 DOA: 09/05/2019 PCP: Philip Aspen, Limmie Patricia, MD   Brief Narrative:  86 year old female with history of hyperlipidemia, diabetes mellitus type 2, osteoarthritis, anxiety presented with weakness for past several days, poor oral intake, confusion and trouble getting words out.  In the ER, CT of the head was negative for acute abnormality.  UA was suggestive of UTI.  She was admitted for sepsis secondary to urinary tract infection.  Assessment & Plan:   Sepsis: Present on admission, probably from UTI UTI -Urine cultures growing multiple species.  Blood cultures negative so far. -Patient was started on Rocephin and vancomycin was added.  We will continue Rocephin.  Vancomycin DC'd on 09/07/2019 - IV fluids plan as below.  Influenza and COVID-19 PCR negative on presentation. -Chest x-ray on presentation was negative but repeat chest x-ray on 09/06/2019 had shown possible left basilar atelectasis versus infiltrate.  Acute metabolic encephalopathy Generalized weakness/confusion and mild aphasia Moderate protein calorie malnutrition -CT of the head was negative for acute abnormality.  MRI of the brain did not show any acute infarct. -Still very confused but mental status slightly improved as per nursing staff. -PT/OT recommend SNF; family hesitant to take the patient to SNF.  Diet as per SLP recommendations. -Palliative care is supposed to meet with family today.  Overall prognosis is guarded to poor.  If condition does not improve, consider hospice/comfort measures. -Vitamin B12 and TSH levels normal -Follow nutrition recommendations  Anemia and thrombocytopenia of unknown etiology -No signs of bleeding.  Monitor -Status post 1 unit packed red cell transfusion on 09/06/2019 -Platelets worsening to 32 today.  Hypomagnesemia -Replace  Acute kidney  injury -Baseline creatinine 0.8.  Presented with creatinine of 1.4.  Proved.  Creatinine 1.  Hypernatremia -Probably from poor oral intake.  Change IV fluids to D5 at 50 cc an hour.  Monitor  GERD -Continue PPI  Stage III sacral ulcer, unstageable left medial medullary ulcer: Present on admission -Management as per wound care nursing team   DVT prophylaxis: SCDs Code Status: DNR Family Communication: called daughter/[name] on phone on 09/06/2019 Disposition Plan: will remain inpatient for now as patient, not able to take much orally and requiring IV antibiotics.  Consultants:  palliative care  Procedures: None  Antimicrobials:  Anti-infectives (From admission, onward)   Start     Dose/Rate Route Frequency Ordered Stop   09/08/19 1000  vancomycin (VANCOREADY) IVPB 1250 mg/250 mL  Status:  Discontinued     1,250 mg 166.7 mL/hr over 90 Minutes Intravenous Every 48 hours 09/06/19 1426 09/07/19 1110   09/07/19 1200  cefTRIAXone (ROCEPHIN) 1 g in sodium chloride 0.9 % 100 mL IVPB     1 g 200 mL/hr over 30 Minutes Intravenous Every 24 hours 09/07/19 1110     09/06/19 1230  vancomycin (VANCOREADY) IVPB 1500 mg/300 mL     1,500 mg 150 mL/hr over 120 Minutes Intravenous  Once 09/06/19 1148 09/06/19 1451   09/05/19 1330  cefTRIAXone (ROCEPHIN) 1 g in sodium chloride 0.9 % 100 mL IVPB     1 g 200 mL/hr over 30 Minutes Intravenous  Once 09/05/19 1329 09/05/19 1557       Subjective: Patient seen and examined at bedside.  Very poor historian.  Awake this morning, hardly answers any questions.  No overnight fever, vomiting or agitation reported overnight. Objective: Vitals:  09/07/19 2354 09/08/19 0315 09/08/19 0532 09/08/19 0632  BP: 120/74 102/86    Pulse:      Resp: (!) 25 20    Temp: (!) 96.9 F (36.1 C) (!) 96.5 F (35.8 C) 97.7 F (36.5 C) 98.7 F (37.1 C)  TempSrc: Axillary Axillary Oral Oral  SpO2: 90% 95%    Weight:      Height:        Intake/Output Summary (Last  24 hours) at 09/08/2019 0725 Last data filed at 09/08/2019 0647 Gross per 24 hour  Intake 775 ml  Output 925 ml  Net -150 ml   Filed Weights   09/05/19 2332  Weight: 67.3 kg    Examination:  General exam: Awake this morning.  Elderly female lying in bed.  hardly answers any questions.  Very poor historian. Respiratory system: Bilateral decreased breath sounds at bases with some crackles.  Intermittently tachypneic.   Cardiovascular system: Rate controlled, S1-S2 heard Gastrointestinal system: Abdomen is nondistended, soft and nontender.  Bowel sounds heard  extremities: No clubbing or cyanosis; trace lower extremity edema Central nervous system: Awake, hardly answers any questions. Moving extremities Skin: No ulcers or rashes Psychiatry: Cannot assess because of mental status    Data Reviewed: I have personally reviewed following labs and imaging studies  CBC: Recent Labs  Lab 09/05/19 1257 09/05/19 1257 09/06/19 0235 09/06/19 1602 09/06/19 1951 09/07/19 0321 09/08/19 0137  WBC 4.6  --  6.0  --  7.9 7.3 4.7  NEUTROABS 2.7  --   --   --   --   --   --   HGB 7.3*  --  6.6*  --  8.0* 8.1* 8.2*  HCT 26.1*   < > 22.9* 24.6* 25.9* 26.4* 26.5*  MCV 87.0  --  84.5  --  82.0 83.0 82.3  PLT 68*  --  53*  --  40* 61* 32*   < > = values in this interval not displayed.   Basic Metabolic Panel: Recent Labs  Lab 09/05/19 1257 09/06/19 0235 09/07/19 0619 09/08/19 0137  NA 145 147* 147* 143  K 4.9 5.3* 4.9 5.3*  CL 117* 121* 115* 117*  CO2 19* 19* 20* 17*  GLUCOSE 184* 73 121* 242*  BUN 25* 25* 24* 24*  CREATININE 1.40* 1.27* 1.14* 1.00  CALCIUM 8.6* 8.1* 8.2* 7.8*  MG  --   --  1.3* 1.5*   GFR: Estimated Creatinine Clearance: 28.2 mL/min (by C-G formula based on SCr of 1 mg/dL). Liver Function Tests: Recent Labs  Lab 09/05/19 1257 09/07/19 0619 09/08/19 0137  AST 28 44* 56*  ALT 24 26 23   ALKPHOS 63 55 59  BILITOT 0.3 0.8 0.9  PROT 6.3* 5.9* 5.7*  ALBUMIN 2.7*  2.3* 2.1*   No results for input(s): LIPASE, AMYLASE in the last 168 hours. No results for input(s): AMMONIA in the last 168 hours. Coagulation Profile: Recent Labs  Lab 09/05/19 1625  INR 1.4*   Cardiac Enzymes: No results for input(s): CKTOTAL, CKMB, CKMBINDEX, TROPONINI in the last 168 hours. BNP (last 3 results) No results for input(s): PROBNP in the last 8760 hours. HbA1C: Recent Labs    09/05/19 1713  HGBA1C 9.3*   CBG: Recent Labs  Lab 09/06/19 2339 09/07/19 1130 09/07/19 1907 09/07/19 2350 09/08/19 0620  GLUCAP 89 134* 186* 249* 225*   Lipid Profile: No results for input(s): CHOL, HDL, LDLCALC, TRIG, CHOLHDL, LDLDIRECT in the last 72 hours. Thyroid Function Tests: Recent Labs  09/05/19 1257  TSH 3.685   Anemia Panel: Recent Labs    09/06/19 1602  VITAMINB12 589  FERRITIN 585*  TIBC 154*  IRON 53   Sepsis Labs: Recent Labs  Lab 09/05/19 1713 09/05/19 2346 09/06/19 1602 09/06/19 2035  PROCALCITON  --   --  <0.10  --   LATICACIDVEN 3.3* 3.4* 1.4 1.2    Recent Results (from the past 240 hour(s))  Culture, blood (routine x 2)     Status: None (Preliminary result)   Collection Time: 09/05/19 12:49 PM   Specimen: BLOOD  Result Value Ref Range Status   Specimen Description BLOOD RIGHT ANTECUBITAL  Final   Special Requests   Final    BOTTLES DRAWN AEROBIC AND ANAEROBIC Blood Culture adequate volume   Culture   Final    NO GROWTH 2 DAYS Performed at [facility], [address], [place], [place] [address]    Report Status PENDING  Incomplete  Urine culture     Status: Abnormal   Collection Time: 09/05/19 12:57 PM   Specimen: Urine, Random  Result Value Ref Range Status   Specimen Description URINE, RANDOM  Final   Special Requests   Final    NONE Performed at [facility], [address] [place], [place] [address]    Culture MULTIPLE SPECIES PRESENT, SUGGEST RECOLLECTION (A)  Final   Report Status 09/06/2019 FINAL  Final    Respiratory Panel by RT PCR (Flu A&B, Covid) - Nasopharyngeal Swab     Status: None   Collection Time: 09/05/19  2:30 PM   Specimen: Nasopharyngeal Swab  Result Value Ref Range Status   SARS Coronavirus 2 by RT PCR NEGATIVE NEGATIVE Final    Comment: (NOTE) SARS-CoV-2 target nucleic acids are NOT DETECTED. The SARS-CoV-2 RNA is generally detectable in upper respiratoy specimens during the acute phase of infection. The lowest concentration of SARS-CoV-2 viral copies this assay can detect is 131 copies/mL. A negative result does not preclude SARS-Cov-2 infection and should not be used as the sole basis for treatment or other patient management decisions. A negative result may occur with  improper specimen collection/handling, submission of specimen other than nasopharyngeal swab, presence of viral mutation(s) within the areas targeted by this assay, and inadequate number of viral copies (<131 copies/mL). A negative result must be combined with clinical observations, patient history, and epidemiological information. The expected result is Negative. Fact Sheet for Patients:  https://www.moore.com/ Fact Sheet for Healthcare Providers:  https://www.young.biz/ This test is not yet ap proved or cleared by the [place] FDA and  has been authorized for detection and/or diagnosis of SARS-CoV-2 by FDA under an Emergency Use Authorization (EUA). This EUA will remain  in effect (meaning this test can be used) for the duration of the COVID-19 declaration under Section 564(b)(1) of the Act, 21 U.S.C. section 360bbb-3(b)(1), unless the authorization is terminated or revoked sooner.    Influenza A by PCR NEGATIVE NEGATIVE Final   Influenza B by PCR NEGATIVE NEGATIVE Final    Comment: (NOTE) The Xpert Xpress SARS-CoV-2/FLU/RSV assay is intended as an aid in  the diagnosis of influenza from Nasopharyngeal swab specimens and  should not be used as a sole  basis for treatment. Nasal washings and  aspirates are unacceptable for Xpert Xpress SARS-CoV-2/FLU/RSV  testing. Fact Sheet for Patients: https://www.moore.com/ Fact Sheet for Healthcare Providers: https://www.young.biz/ This test is not yet approved or cleared by the [place] FDA and  has been authorized for detection  and/or diagnosis of SARS-CoV-2 by  FDA under an Emergency Use Authorization (EUA). This EUA will remain  in effect (meaning this test can be used) for the duration of the  Covid-19 declaration under Section 564(b)(1) of the Act, 21  U.S.C. section 360bbb-3(b)(1), unless the authorization is  terminated or revoked. Performed at [facility], [address] [place], [place] [address]   Culture, blood (routine x 2)     Status: None (Preliminary result)   Collection Time: 09/05/19  2:35 PM   Specimen: BLOOD LEFT WRIST  Result Value Ref Range Status   Specimen Description BLOOD LEFT WRIST  Final   Special Requests   Final    BOTTLES DRAWN AEROBIC AND ANAEROBIC Blood Culture adequate volume   Culture  Setup Time   Final    GRAM POSITIVE RODS AEROBIC BOTTLE ONLY CRITICAL RESULT CALLED TO, READ BACK BY AND VERIFIED WITH: Orson Ape ABBOTT 0540 [id number] FCP Performed at [facility], [address], [place], [place] [address]    Culture GRAM POSITIVE RODS  Final   Report Status PENDING  Incomplete  MRSA PCR Screening     Status: None   Collection Time: 09/05/19 11:25 PM   Specimen: Nasal Mucosa; Nasopharyngeal  Result Value Ref Range Status   MRSA by PCR NEGATIVE NEGATIVE Final    Comment:        The GeneXpert MRSA Assay (FDA approved for NASAL specimens only), is one component of a comprehensive MRSA colonization surveillance program. It is not intended to diagnose MRSA infection nor to guide or monitor treatment for MRSA infections. Performed at [facility], [address] [place], [place]  [address]   Culture, Urine     Status: Abnormal   Collection Time: 09/06/19  1:00 PM   Specimen: Urine, Clean Catch  Result Value Ref Range Status   Specimen Description URINE, CLEAN CATCH  Final   Special Requests NONE  Final   Culture (A)  Final    <10,000 COLONIES/mL INSIGNIFICANT GROWTH Performed at [facility], [address], [place], [place] [address]    Report Status 09/07/2019 FINAL  Final         Radiology Studies: MR BRAIN WO CONTRAST  Result Date: 09/07/2019 CLINICAL DATA:  86 year old female with weakness for several days. Confusion, decreased p.o., abnormal speech. EXAM: MRI HEAD WITHOUT CONTRAST TECHNIQUE: Multiplanar, multiecho pulse sequences of the brain and surrounding structures were obtained without intravenous contrast. COMPARISON:  Head CT 09/05/2019. FINDINGS: Brain: No restricted diffusion to suggest acute infarction. No midline shift, mass effect, evidence of mass lesion, ventriculomegaly, extra-axial collection or acute intracranial hemorrhage. Cervicomedullary junction and pituitary are within normal limits. Moderate patchy and confluent bilateral cerebral white matter T2 and FLAIR hyperintensity. No definite cortical encephalomalacia, chronic cerebral blood products. The deep gray nuclei, brainstem and cerebellum are within normal limits for age. Vascular: Major intracranial vascular flow voids are preserved. Skull and upper cervical spine: Cervical spine degeneration with probable mild spinal stenosis visible at C3-C4 and C4-C5 on series 13, image 11. Visualized bone marrow signal is within normal limits. Sinuses/Orbits: Postoperative changes to both globes, otherwise negative orbits. Paranasal sinuses and mastoids are stable and well pneumatized. Other: Scalp and face soft tissues appear negative. IMPRESSION: 1.  No acute intracranial abnormality. 2. Moderate cerebral white matter signal changes, nonspecific but most commonly due to chronic small vessel  disease. 3. Cervical spine degeneration with suspected mild spinal stenosis. Electronically Signed  By: Odessa Fleming M.D.   On: 09/07/2019 15:50   DG Chest Port 1 View  Result Date: 09/06/2019 CLINICAL DATA:  Dyspnea. EXAM: PORTABLE CHEST 1 VIEW COMPARISON:  September 05, 2019. FINDINGS: Stable cardiomegaly. Atherosclerosis of thoracic aorta. No pneumothorax is noted. Interval development of left basilar atelectasis or infiltrate is noted with possible small pleural effusion. Right lung is unremarkable. Bony thorax is unremarkable. IMPRESSION: Aortic atherosclerosis. Interval development of left basilar atelectasis or infiltrate with possible small pleural effusion. Electronically Signed   By: Lupita Raider M.D.   On: 09/06/2019 09:41        Scheduled Meds:  collagenase   Topical Daily   insulin aspart  0-6 Units Subcutaneous TID WC   LORazepam  0.5 mg Intravenous Once   [START ON 09/09/2019] mouth rinse  15 mL Mouth Rinse BID   pantoprazole  40 mg Oral Daily   Continuous Infusions:  cefTRIAXone (ROCEPHIN)  IV Stopped (09/07/19 1318)   dextrose 75 mL/hr at 09/08/19 0532          Glade Lloyd, MD [facility] 09/08/2019, 7:25 AM

## 2019-09-08 NOTE — Progress Notes (Signed)
Axillary temperature taken of 96.3 F. RN and NT proceeded to do a rectal temperature which was 95.0 F. On call TRH made aware and placed order for warming blanket. Warming blanket placed on pt. Will continue to monitor.

## 2019-09-08 NOTE — Progress Notes (Signed)
Occupational Therapy Treatment Patient Details Name: Christie [name] MRN: [id number] DOB: January 14, 1921 Today's Date: 09/08/2019    History of present illness (P) 86 yr old woman who lives at home with her family. She apparently began to be weak last Saturday. Since then she has had poor PO intake. Today she had trouble getting her words out and was very confused, found to have UTI. PMH significant for anxiety, DM II, gastric ulcer, HTN, HLD, OA   OT comments  Pt continues more lethargic today, awakens once assisted to EOB. Requires +2 max to total assist for all mobility and is dependent in ADL. Daughter is agreeable to SNF.  Follow Up Recommendations  SNF;Supervision/Assistance - 24 hour    Equipment Recommendations  Other (comment)(defer to next venue)    Recommendations for Other Services      Precautions / Restrictions Precautions Precautions: (P) Fall Precaution Comments: (P) pt with mitts, pulls at lines Restrictions Weight Bearing Restrictions: (P) No       Mobility Bed Mobility Overal bed mobility: (P) Needs Assistance Bed Mobility: (P) Supine to Sit;Sit to Supine     Supine to sit: (P) Max assist Sit to supine: (P) Max assist   General bed mobility comments: assist for all aspects  Transfers Overall transfer level: (P) Needs assistance Equipment used: (P) 1 person hand held assist Transfers: (P) Sit to/from Stand Sit to Stand: (P) Max assist Stand pivot transfers: +2 physical assistance;Total assist       General transfer comment: (P) PT utilizing L knee block and support under BUE    Balance Overall balance assessment: (P) Needs assistance Sitting-balance support: (P) Bilateral upper extremity supported;Feet supported Sitting balance-Leahy Scale: (P) Fair Sitting balance - Comments: (P) minG with BUE support of bed   Standing balance support: (P) Bilateral upper extremity supported Standing balance-Leahy Scale: (P) Zero Standing balance comment: (P)  maxA for static standing balance                           ADL either performed or assessed with clinical judgement   ADL                                         General ADL Comments: requires total assist     Vision       Perception     Praxis      Cognition Arousal/Alertness: (P) Awake/alert Behavior During Therapy: (P) Restless Overall Cognitive Status: (P) Impaired/Different from baseline Area of Impairment: (P) Following commands;Safety/judgement;Problem solving                       Following Commands: (P) Follows one step commands with increased time(50% one step commands with increased time) Safety/Judgement: (P) Decreased awareness of safety;Decreased awareness of deficits   Problem Solving: (P) Decreased initiation;Difficulty sequencing          Exercises     Shoulder Instructions       General Comments      Pertinent Vitals/ Pain       Pain Assessment: Faces Faces Pain Scale: (P) No hurt Pain Location: LEs Pain Descriptors / Indicators: Grimacing Pain Intervention(s): Monitored during session;Repositioned  Home Living  Prior Functioning/Environment              Frequency  Min 2X/week        Progress Toward Goals  OT Goals(current goals can now be found in the care plan section)  Progress towards OT goals: Not progressing toward goals - comment(weakness, impaired cognition)  Acute Rehab OT Goals Patient Stated Goal: (P) to improve mobility OT Goal Formulation: Patient unable to participate in goal setting Time For Goal Achievement: 09/21/19 Potential to Achieve Goals: Good  Plan Discharge plan remains appropriate    Co-evaluation    PT/OT/SLP Co-Evaluation/Treatment: Yes Reason for Co-Treatment: For patient/therapist safety   OT goals addressed during session: Proper use of Adaptive equipment and DME      AM-PAC OT "6 Clicks"  Daily Activity     Outcome Measure   Help from another person eating meals?: Total Help from another person taking care of personal grooming?: Total Help from another person toileting, which includes using toliet, bedpan, or urinal?: Total Help from another person bathing (including washing, rinsing, drying)?: Total Help from another person to put on and taking off regular upper body clothing?: Total Help from another person to put on and taking off regular lower body clothing?: Total 6 Click Score: 6    End of Session Equipment Utilized During Treatment: Rolling walker;Gait belt  OT Visit Diagnosis: Muscle weakness (generalized) (M62.81);Other symptoms and signs involving cognitive function   Activity Tolerance Patient tolerated treatment well   Patient Left in chair;with call bell/phone within reach;with family/visitor present   Nurse Communication (ok to leave off mitts, wheezing)        Time: 0947-0962 OT Time Calculation (min): 24 min  Charges: OT General Charges $OT Visit: 1 Visit OT Treatments $Therapeutic Activity: 8-22 mins  Nestor Lewandowsky, OTR/L Acute Rehabilitation Services Pager: [phone] Office: [phone]   Malka So 09/08/2019, 1:54 PM

## 2019-09-08 NOTE — Consult Note (Signed)
Consultation Note Date: 09/08/2019   Patient Name: Christie [name]  DOB: [birth year]  MRN: [id number]  Age / Sex: 86 y.o., female  PCP: Isaac Bliss, Rayford Halsted, MD Referring Physician: Aline August, MD  Reason for Consultation: Establishing goals of care  HPI/Patient Profile: 86 y.o. female  with past medical history of anxiety, T2DM, HLD, HTN, and OA admitted on 09/05/2019 with confusion and weakness. CT head negative. MRI negative. UA suggestive of UTI. Admitted for urosepsis and treated with IV antibiotics. Has done well with SLP eval with no signs of aspiration. PMT consulted for [place].  Clinical Assessment and Goals of Care: I have reviewed medical records including EPIC notes, labs and imaging, received report from RN, assessed the patient and then met with patient's daughter, Janeice Robinson,  to discuss diagnosis prognosis, GOC, EOL wishes, disposition and options.  I introduced Palliative Medicine as specialized medical care for people living with serious illness. It focuses on providing relief from the symptoms and stress of a serious illness. The goal is to improve quality of life for both the patient and the family.  We discussed a brief life review of the patient. [place] - gets up at 4:30 every morning. She spends her time watching TV. Ms. Pennella lives with Janeice Robinson and she is her primary caregiver.  As far as functional and nutritional status, she tells me she is mostly sedentary but could get up with a walker to walk to the bathroom. She tells me she had a great appetite. Some cognitive decline - more forgetful and trouble word finding. She tells me she has had a decline over the past few weeks - weaker and sleeping more.    We discussed her current illness and what it means in the larger context of her on-going co-morbidities.  Natural disease trajectory and expectations at EOL were discussed. Discussed concerns about  weakness and mental status, poor PO intake.   I attempted to elicit values and goals of care important to the patient.  [name] tells me Ms. Antonini would not be interested in heroic measures and would want to be made comfortable if she were not improving.   The difference between aggressive medical intervention and comfort care was considered in light of the patient's goals of care. We discussed a full shift to comfort care if Ms. Hallmon does not improve or declines - [name] is in agreement with this.   Confirmed DNR status.   Discussed disposition - [name] tells me that she was initially not interested in rehab placement but after speaking to her sister and taking time to think - she would like to try rehab to see if Ms. Mi can regain some function so that she will do better at home. We discussed care at home once rehab is complete or if rehab is no longer an option - discussed home health vs hospice. Janeice Robinson is interested in hospice care at home following rehab. She is also agreeable to palliative following patient at rehab.   We discussed that patient's mental status continues to fluctuate and I am concerned that she may not improve. If she were not eating and declining [name] would want to shift to full comfort care.  Questions and concerns were addressed. The family was encouraged to call with questions or concerns.   Primary Decision Maker NEXT OF KIN - 2 daughters [name] and Rosa   SUMMARY OF RECOMMENDATIONS   - continue current measures but DNR status - if patient stops  PO intake/declines - family okay with transition to comfort care/hospice - for now, plan for rehab with palliative to follow with hopes of regaining some ability to ambulate - family is interested in hospice care once rehab complete  **Family requests direct communication and would like to be told if patient is not improving or declining. I will not be on over the weekend - please follow [place] as outlined above.  If further assistance is needed by PMT please call [phone]  Code Status/Advance Care Planning:  DNR  Psycho-social/Spiritual:   Desire for further Chaplaincy support:no  Additional Recommendations: Education on Hospice  Prognosis:   Unable to determine   Discharge Planning: [place] for rehab with Palliative care service follow-up      Primary Diagnoses: Present on Admission: . Sepsis secondary to UTI ([place]) . Hypotension . Confusion . Anxiety state . Lactic acidosis . Anemia in other chronic diseases classified elsewhere . Thrombocytopenia ([place]) . Sepsis ([place]) . Controlled type 2 diabetes with neuropathy ([place]) . Essential hypertension . Dyslipidemia   I have reviewed the medical record, interviewed the patient and family, and examined the patient. The following aspects are pertinent.  Past Medical History:  Diagnosis Date  . ANXIETY 01/31/2007  . DIABETES MELLITUS, TYPE II 01/31/2007  . Gastric ulcer   . Headache(784.0) 01/31/2007  . HYPERLIPIDEMIA 01/31/2007  . HYPERTENSION 01/31/2007  . HYPOKALEMIA, HX OF 02/09/2008  . OSTEOARTHRITIS 01/31/2007   Social History   Socioeconomic History  . Marital status: Widowed    Spouse name: Not on file  . Number of children: 4  . Years of education: Not on file  . Highest education level: Not on file  Occupational History  . Occupation: Retired  Tobacco Use  . Smoking status: Former Smoker    Types: Cigarettes  . Smokeless tobacco: Never Used  Substance and Sexual Activity  . Alcohol use: No    Alcohol/week: 0.0 standard drinks  . Drug use: No  . Sexual activity: Not on file  Other Topics Concern  . Not on file  Social History Narrative  . Not on file   Social Determinants of Health   Financial Resource Strain:   . Difficulty of Paying Living Expenses: Not on file  Food Insecurity:   . Worried About Charity fundraiser in the Last Year: Not on file  . Ran Out of Food in the Last Year: Not on  file  Transportation Needs:   . Lack of Transportation (Medical): Not on file  . Lack of Transportation (Non-Medical): Not on file  Physical Activity:   . Days of Exercise per Week: Not on file  . Minutes of Exercise per Session: Not on file  Stress:   . Feeling of Stress : Not on file  Social Connections:   . Frequency of Communication with Friends and Family: Not on file  . Frequency of Social Gatherings with Friends and Family: Not on file  . Attends Religious Services: Not on file  . Active Member of Clubs or Organizations: Not on file  . Attends Archivist Meetings: Not on file  . Marital Status: Not on file   Family History  Problem Relation Age of Onset  . Breast cancer Sister   . Heart disease Neg Hx   . Esophageal cancer Neg Hx   . Lung cancer Sister   . Prostate cancer Brother   . Colon cancer Neg Hx   . Colon polyps Neg Hx   . Stomach cancer  Neg Hx    Scheduled Meds: . collagenase   Topical Daily  . insulin aspart  0-6 Units Subcutaneous TID WC  . [START ON 09/09/2019] mouth rinse  15 mL Mouth Rinse BID  . pantoprazole  40 mg Oral Daily   Continuous Infusions: . cefTRIAXone (ROCEPHIN)  IV Stopped (09/07/19 1318)  . dextrose 75 mL/hr at 09/08/19 0532   PRN Meds:.acetaminophen **OR** acetaminophen, albuterol, diazepam, hydroxypropyl methylcellulose / hypromellose, ondansetron **OR** ondansetron (ZOFRAN) IV, senna-docusate, sorbitol Allergies  Allergen Reactions  . Codeine    Review of Systems  Unable to perform ROS: Mental status change    Physical Exam Constitutional:      General: She is not in acute distress.    Comments: Opens eyes to physical stimulation - was apparently alert earlier with SLP, now sleeping  HENT:     Head: Normocephalic and atraumatic.  Cardiovascular:     Rate and Rhythm: Normal rate.  Pulmonary:     Effort: Pulmonary effort is normal.     Breath sounds: Wheezing present.  Musculoskeletal:     Comments: BLE in  pressure reducing boots  Skin:    General: Skin is warm and dry.  Neurological:     Comments: Unable to assess     Vital Signs: BP (!) 144/86 (BP Location: Right Arm)   Pulse 87   Temp 98.7 F (37.1 C) (Oral)   Resp 20   Ht 5' (1.524 m)   Wt 67.3 kg   SpO2 100%   BMI 28.98 kg/m  Pain Scale: 0-10   Pain Score: 0-No pain   SpO2: SpO2: 100 % O2 Device:SpO2: 100 % O2 Flow Rate: .   IO: Intake/output summary:   Intake/Output Summary (Last 24 hours) at 09/08/2019 1200 Last data filed at 09/08/2019 1100 Gross per 24 hour  Intake 1125 ml  Output 925 ml  Net 200 ml    LBM:   Baseline Weight: Weight: 67.3 kg Most recent weight: Weight: 67.3 kg     Palliative Assessment/Data: PPS 40%    Time Total: 70 minutes Greater than 50%  of this time was spent counseling and coordinating care related to the above assessment and plan.  Juel Burrow, DNP, AGNP-C Palliative Medicine Team [phone] Pager: [phone]

## 2019-09-09 LAB — COMPREHENSIVE METABOLIC PANEL
ALT: 25 U/L (ref 0–44)
AST: 30 U/L (ref 15–41)
Albumin: 2.1 g/dL — ABNORMAL LOW (ref 3.5–5.0)
Alkaline Phosphatase: 65 U/L (ref 38–126)
Anion gap: 9 (ref 5–15)
BUN: 17 mg/dL (ref 8–23)
CO2: 17 mmol/L — ABNORMAL LOW (ref 22–32)
Calcium: 7.8 mg/dL — ABNORMAL LOW (ref 8.9–10.3)
Chloride: 112 mmol/L — ABNORMAL HIGH (ref 98–111)
Creatinine, Ser: 0.93 mg/dL (ref 0.44–1.00)
GFR calc Af Amer: >60 mL/min (ref >60)
GFR calc non Af Amer: 52 mL/min — ABNORMAL LOW (ref >60)
Glucose, Bld: 308 mg/dL — ABNORMAL HIGH (ref 70–99)
Potassium: 4.2 mmol/L (ref 3.5–5.1)
Sodium: 138 mmol/L (ref 135–145)
Total Bilirubin: 0.6 mg/dL (ref 0.3–1.2)
Total Protein: 5.9 g/dL — ABNORMAL LOW (ref 6.5–8.1)

## 2019-09-09 LAB — CBC
HCT: 25.8 % — ABNORMAL LOW (ref 36.0–46.0)
Hemoglobin: 7.8 g/dL — ABNORMAL LOW (ref 12.0–15.0)
MCH: 24.8 pg — ABNORMAL LOW (ref 26.0–34.0)
MCHC: 30.2 g/dL (ref 30.0–36.0)
MCV: 82.2 fL (ref 80.0–100.0)
Platelets: 31 10*3/uL — ABNORMAL LOW (ref 150–400)
RBC: 3.14 MIL/uL — ABNORMAL LOW (ref 3.87–5.11)
RDW: 17.8 % — ABNORMAL HIGH (ref 11.5–15.5)
WBC: 4.1 10*3/uL (ref 4.0–10.5)
nRBC: 1.5 % — ABNORMAL HIGH (ref 0.0–0.2)

## 2019-09-09 LAB — GLUCOSE, CAPILLARY
Glucose-Capillary: 183 mg/dL — ABNORMAL HIGH (ref 70–99)
Glucose-Capillary: 272 mg/dL — ABNORMAL HIGH (ref 70–99)
Glucose-Capillary: 281 mg/dL — ABNORMAL HIGH (ref 70–99)
Glucose-Capillary: 340 mg/dL — ABNORMAL HIGH (ref 70–99)
Glucose-Capillary: 349 mg/dL — ABNORMAL HIGH (ref 70–99)

## 2019-09-09 LAB — MAGNESIUM: Magnesium: 2 mg/dL (ref 1.7–2.4)

## 2019-09-09 NOTE — Progress Notes (Signed)
Patient ID: Christie [name], female   DOB: [birth year], 86 y.o.   MRN: [id number]  PROGRESS NOTE    Christie [name]  DJS:[id number] DOB: 1920-10-12 DOA: 09/05/2019 PCP: Philip Aspen, Limmie Patricia, MD   Brief Narrative:  86 year old female with history of hyperlipidemia, diabetes mellitus type 2, osteoarthritis, anxiety presented with weakness for past several days, poor oral intake, confusion and trouble getting words out.  In the ER, CT of the head was negative for acute abnormality.  UA was suggestive of UTI.  She was admitted for sepsis secondary to urinary tract infection.  Assessment & Plan:   Sepsis: Present on admission, probably from UTI UTI -Urine cultures growing multiple species.  Blood cultures negative so far. -Patient was started on Rocephin and vancomycin was added.  continue Rocephin.  Vancomycin DC'd on 09/07/2019 - IV fluids plan as below.  Influenza and COVID-19 PCR negative on presentation. -Chest x-ray on presentation was negative but repeat chest x-ray on 09/06/2019 had shown possible left basilar atelectasis versus infiltrate.  Acute metabolic encephalopathy Generalized weakness/confusion and mild aphasia Moderate protein calorie malnutrition -CT of the head was negative for acute abnormality.  MRI of the brain did not show any acute infarct. -Mental status improving but not back to baseline yet.  Still a little dysarthric. -PT/OT recommends SNF.  Diet as per SLP recommendations. -Palliative care evaluation appreciated.  Continue current level of care and patient will need SNF placement.  If condition does not improve, consider hospice/comfort measures. -Vitamin B12 and TSH levels normal -Follow nutrition recommendations  Anemia and thrombocytopenia of unknown etiology -No signs of bleeding.  Monitor -Status post 1 unit packed red cell transfusion on 09/06/2019 -Labs pending today  Hypomagnesemia -Labs pending today  Acute kidney injury -Baseline creatinine 0.8.   Presented with creatinine of 1.4.  Improved.  Labs pending today.  Hypernatremia -Probably from poor oral intake.  Improved.  DC IV fluids.  GERD -Continue PPI  Stage III sacral ulcer, unstageable left medial medullary ulcer: Present on admission -Management as per wound care nursing team   DVT prophylaxis: SCDs Code Status: DNR Family Communication: called daughter/[name] on phone on 09/06/2019 Disposition Plan: SNF once bed is available.  Overall prognosis very poor. Consultants:  palliative care  Procedures: None  Antimicrobials:  Anti-infectives (From admission, onward)   Start     Dose/Rate Route Frequency Ordered Stop   09/08/19 1000  vancomycin (VANCOREADY) IVPB 1250 mg/250 mL  Status:  Discontinued     1,250 mg 166.7 mL/hr over 90 Minutes Intravenous Every 48 hours 09/06/19 1426 09/07/19 1110   09/07/19 1200  cefTRIAXone (ROCEPHIN) 1 g in sodium chloride 0.9 % 100 mL IVPB     1 g 200 mL/hr over 30 Minutes Intravenous Every 24 hours 09/07/19 1110     09/06/19 1230  vancomycin (VANCOREADY) IVPB 1500 mg/300 mL     1,500 mg 150 mL/hr over 120 Minutes Intravenous  Once 09/06/19 1148 09/06/19 1451   09/05/19 1330  cefTRIAXone (ROCEPHIN) 1 g in sodium chloride 0.9 % 100 mL IVPB     1 g 200 mL/hr over 30 Minutes Intravenous  Once 09/05/19 1329 09/05/19 1557       Subjective: Patient seen and examined at bedside.  Extremely poor historian.  More awake this morning, intermittently smiling but pleasantly confused and slightly dysarthric.  No overnight fever, agitation or vomiting reported by nursing staff.    Objective: Vitals:   09/08/19 1939 09/08/19 2348 09/09/19 0050 09/09/19 0433  BP: (!) 147/83 Marland Kitchen)  120/51 (!) 154/67 (!) 132/57  Pulse: 70 64 69 66  Resp: 20 18 18    Temp: 98.7 F (37.1 C) (!) 97.3 F (36.3 C) 97.8 F (36.6 C) 99.1 F (37.3 C)  TempSrc: Oral Axillary Oral Oral  SpO2: 98% 96% 100% 100%  Weight:      Height:        Intake/Output Summary (Last  24 hours) at 09/09/2019 0743 Last data filed at 09/09/2019 0500 Gross per 24 hour  Intake 995 ml  Output 550 ml  Net 445 ml   Filed Weights   09/05/19 2332  Weight: 67.3 kg    Examination:  General exam: More awake this morning, intermittently smiling but pleasantly confused and slightly dysarthric.  Elderly female lying in bed.  very poor historian.  No acute distress. Respiratory system: Bilateral decreased breath sounds at bases with crackles.  Cardiovascular system: S1-S2 heard, rate controlled Gastrointestinal system: Abdomen is nondistended, soft and nontender.  Normal bowel sounds heard  extremities: No cyanosis; trace lower extremity edema present bilaterally    Data Reviewed: I have personally reviewed following labs and imaging studies  CBC: Recent Labs  Lab 09/05/19 1257 09/05/19 1257 09/06/19 0235 09/06/19 1602 09/06/19 1951 09/07/19 0321 09/08/19 0137  WBC 4.6  --  6.0  --  7.9 7.3 4.7  NEUTROABS 2.7  --   --   --   --   --   --   HGB 7.3*  --  6.6*  --  8.0* 8.1* 8.2*  HCT 26.1*   < > 22.9* 24.6* 25.9* 26.4* 26.5*  MCV 87.0  --  84.5  --  82.0 83.0 82.3  PLT 68*  --  53*  --  40* 61* 32*   < > = values in this interval not displayed.   Basic Metabolic Panel: Recent Labs  Lab 09/05/19 1257 09/06/19 0235 09/07/19 0619 09/08/19 0137  NA 145 147* 147* 143  K 4.9 5.3* 4.9 5.3*  CL 117* 121* 115* 117*  CO2 19* 19* 20* 17*  GLUCOSE 184* 73 121* 242*  BUN 25* 25* 24* 24*  CREATININE 1.40* 1.27* 1.14* 1.00  CALCIUM 8.6* 8.1* 8.2* 7.8*  MG  --   --  1.3* 1.5*   GFR: Estimated Creatinine Clearance: 28.2 mL/min (by C-G formula based on SCr of 1 mg/dL). Liver Function Tests: Recent Labs  Lab 09/05/19 1257 09/07/19 0619 09/08/19 0137  AST 28 44* 56*  ALT 24 26 23   ALKPHOS 63 55 59  BILITOT 0.3 0.8 0.9  PROT 6.3* 5.9* 5.7*  ALBUMIN 2.7* 2.3* 2.1*   No results for input(s): LIPASE, AMYLASE in the last 168 hours. No results for input(s): AMMONIA in  the last 168 hours. Coagulation Profile: Recent Labs  Lab 09/05/19 1625  INR 1.4*   Cardiac Enzymes: No results for input(s): CKTOTAL, CKMB, CKMBINDEX, TROPONINI in the last 168 hours. BNP (last 3 results) No results for input(s): PROBNP in the last 8760 hours. HbA1C: No results for input(s): HGBA1C in the last 72 hours. CBG: Recent Labs  Lab 09/08/19 0620 09/08/19 1209 09/08/19 1733 09/08/19 2347 09/09/19 0509  GLUCAP 225* 182* 266* 340* 281*   Lipid Profile: No results for input(s): CHOL, HDL, LDLCALC, TRIG, CHOLHDL, LDLDIRECT in the last 72 hours. Thyroid Function Tests: No results for input(s): TSH, T4TOTAL, FREET4, T3FREE, THYROIDAB in the last 72 hours. Anemia Panel: Recent Labs    09/06/19 1602  VITAMINB12 589  FERRITIN 585*  TIBC 154*  IRON 53  Sepsis Labs: Recent Labs  Lab 09/05/19 1713 09/05/19 2346 09/06/19 1602 09/06/19 2035  PROCALCITON  --   --  <0.10  --   LATICACIDVEN 3.3* 3.4* 1.4 1.2    Recent Results (from the past 240 hour(s))  Culture, blood (routine x 2)     Status: None (Preliminary result)   Collection Time: 09/05/19 12:49 PM   Specimen: BLOOD  Result Value Ref Range Status   Specimen Description BLOOD RIGHT ANTECUBITAL  Final   Special Requests   Final    BOTTLES DRAWN AEROBIC AND ANAEROBIC Blood Culture adequate volume   Culture   Final    NO GROWTH 3 DAYS Performed at [facility], [address] [place], [place] [address]    Report Status PENDING  Incomplete  Urine culture     Status: Abnormal   Collection Time: 09/05/19 12:57 PM   Specimen: Urine, Random  Result Value Ref Range Status   Specimen Description URINE, RANDOM  Final   Special Requests   Final    NONE Performed at [facility], [address], [place], [place] [address]    Culture MULTIPLE SPECIES PRESENT, SUGGEST RECOLLECTION (A)  Final   Report Status 09/06/2019 FINAL  Final  Respiratory Panel by RT PCR (Flu A&B, Covid) - Nasopharyngeal Swab      Status: None   Collection Time: 09/05/19  2:30 PM   Specimen: Nasopharyngeal Swab  Result Value Ref Range Status   SARS Coronavirus 2 by RT PCR NEGATIVE NEGATIVE Final    Comment: (NOTE) SARS-CoV-2 target nucleic acids are NOT DETECTED. The SARS-CoV-2 RNA is generally detectable in upper respiratoy specimens during the acute phase of infection. The lowest concentration of SARS-CoV-2 viral copies this assay can detect is 131 copies/mL. A negative result does not preclude SARS-Cov-2 infection and should not be used as the sole basis for treatment or other patient management decisions. A negative result may occur with  improper specimen collection/handling, submission of specimen other than nasopharyngeal swab, presence of viral mutation(s) within the areas targeted by this assay, and inadequate number of viral copies (<131 copies/mL). A negative result must be combined with clinical observations, patient history, and epidemiological information. The expected result is Negative. Fact Sheet for Patients:  https://www.moore.com/ Fact Sheet for Healthcare Providers:  https://www.young.biz/ This test is not yet ap proved or cleared by the [place] FDA and  has been authorized for detection and/or diagnosis of SARS-CoV-2 by FDA under an Emergency Use Authorization (EUA). This EUA will remain  in effect (meaning this test can be used) for the duration of the COVID-19 declaration under Section 564(b)(1) of the Act, 21 U.S.C. section 360bbb-3(b)(1), unless the authorization is terminated or revoked sooner.    Influenza A by PCR NEGATIVE NEGATIVE Final   Influenza B by PCR NEGATIVE NEGATIVE Final    Comment: (NOTE) The Xpert Xpress SARS-CoV-2/FLU/RSV assay is intended as an aid in  the diagnosis of influenza from Nasopharyngeal swab specimens and  should not be used as a sole basis for treatment. Nasal washings and  aspirates are unacceptable  for Xpert Xpress SARS-CoV-2/FLU/RSV  testing. Fact Sheet for Patients: https://www.moore.com/ Fact Sheet for Healthcare Providers: https://www.young.biz/ This test is not yet approved or cleared by the [place] FDA and  has been authorized for detection and/or diagnosis of SARS-CoV-2 by  FDA under an Emergency Use Authorization (EUA). This EUA will remain  in effect (meaning this test can be used) for the duration of the  Covid-19 declaration under Section 564(b)(1) of the Act, 21  U.S.C. section 360bbb-3(b)(1), unless the authorization is  terminated or revoked. Performed at [facility], [address], [place], [place] [address]   Culture, blood (routine x 2)     Status: None (Preliminary result)   Collection Time: 09/05/19  2:35 PM   Specimen: BLOOD LEFT WRIST  Result Value Ref Range Status   Specimen Description BLOOD LEFT WRIST  Final   Special Requests   Final    BOTTLES DRAWN AEROBIC AND ANAEROBIC Blood Culture adequate volume   Culture  Setup Time   Final    GRAM POSITIVE RODS AEROBIC BOTTLE ONLY CRITICAL RESULT CALLED TO, READ BACK BY AND VERIFIED WITH: Orson Ape ABBOTT [id number] FCP Performed at [facility], [address] [place], [place] [address]    Culture GRAM POSITIVE RODS  Final   Report Status PENDING  Incomplete  MRSA PCR Screening     Status: None   Collection Time: 09/05/19 11:25 PM   Specimen: Nasal Mucosa; Nasopharyngeal  Result Value Ref Range Status   MRSA by PCR NEGATIVE NEGATIVE Final    Comment:        The GeneXpert MRSA Assay (FDA approved for NASAL specimens only), is one component of a comprehensive MRSA colonization surveillance program. It is not intended to diagnose MRSA infection nor to guide or monitor treatment for MRSA infections. Performed at [facility], [address] [place], [place] [address]   Culture, Urine     Status: Abnormal   Collection Time: 09/06/19   1:00 PM   Specimen: Urine, Clean Catch  Result Value Ref Range Status   Specimen Description URINE, CLEAN CATCH  Final   Special Requests NONE  Final   Culture (A)  Final    <10,000 COLONIES/mL INSIGNIFICANT GROWTH Performed at [facility], [address] [place], [place] [address]    Report Status 09/07/2019 FINAL  Final         Radiology Studies: MR BRAIN WO CONTRAST  Result Date: 09/07/2019 CLINICAL DATA:  86 year old female with weakness for several days. Confusion, decreased p.o., abnormal speech. EXAM: MRI HEAD WITHOUT CONTRAST TECHNIQUE: Multiplanar, multiecho pulse sequences of the brain and surrounding structures were obtained without intravenous contrast. COMPARISON:  Head CT 09/05/2019. FINDINGS: Brain: No restricted diffusion to suggest acute infarction. No midline shift, mass effect, evidence of mass lesion, ventriculomegaly, extra-axial collection or acute intracranial hemorrhage. Cervicomedullary junction and pituitary are within normal limits. Moderate patchy and confluent bilateral cerebral white matter T2 and FLAIR hyperintensity. No definite cortical encephalomalacia, chronic cerebral blood products. The deep gray nuclei, brainstem and cerebellum are within normal limits for age. Vascular: Major intracranial vascular flow voids are preserved. Skull and upper cervical spine: Cervical spine degeneration with probable mild spinal stenosis visible at C3-C4 and C4-C5 on series 13, image 11. Visualized bone marrow signal is within normal limits. Sinuses/Orbits: Postoperative changes to both globes, otherwise negative orbits. Paranasal sinuses and mastoids are stable and well pneumatized. Other: Scalp and face soft tissues appear negative. IMPRESSION: 1.  No acute intracranial abnormality. 2. Moderate cerebral white matter signal changes, nonspecific but most commonly due to chronic small vessel disease. 3. Cervical spine degeneration with suspected mild spinal stenosis.  Electronically Signed   By: Odessa Fleming M.D.   On: 09/07/2019 15:50        Scheduled Meds: . collagenase   Topical Daily  . insulin aspart  0-6 Units  Subcutaneous TID WC  . mouth rinse  15 mL Mouth Rinse BID  . pantoprazole  40 mg Oral Daily   Continuous Infusions: . cefTRIAXone (ROCEPHIN)  IV 200 mL/hr at 09/08/19 1207  . dextrose 75 mL/hr at 09/08/19 2013          Aline August, MD [facility] 09/09/2019, 7:43 AM

## 2019-09-09 NOTE — TOC Progression Note (Signed)
Transition of Care [facility]) - Progression Note    Patient Details  Name: Christie [name] MRN: [id number] Date of Birth: July 08, 1921  Transition of Care [facility]) CM/SW Contact  Mearl Latin, LCSW Phone Number: 09/09/2019, 11:49 AM  Clinical Narrative:    CSW spoke with patient's daughter, [name], regarding discharge plan following her meeting with Palliative care. Family is requesting patient be placed at SNF for rehab with palliative prior to returning home. CSW will submit referral and provide bed offers when available. [name] is only familiar with [facility] but is open to other nice facilities.Patient will require an updated COVID test 24-48hrs prior to DC.    Expected Discharge Plan: Skilled Nursing Facility Barriers to Discharge: SNF Pending bed offer  Expected Discharge Plan and Services Expected Discharge Plan: Skilled Nursing Facility In-house Referral: Clinical Social Work   Post Acute Care Choice: Skilled Nursing Facility Living arrangements for the past 2 months: Single Family Home                                       Social Determinants of Health (SDOH) Interventions    Readmission Risk Interventions No flowsheet data found.

## 2019-09-09 NOTE — NC FL2 (Signed)
[place] MEDICAID FL2 LEVEL OF CARE SCREENING TOOL     IDENTIFICATION  Patient Name: Christie [name] Birthdate: Sep 04, 1920 Sex: female Admission Date (Current Location): 09/05/2019  [facility] and [place] Number:  Herbalist and Address:  [facility], [place] [address] [place], [place] [address]      Provider Number: [id number]  Attending Physician Name and Address:  Aline August, MD  Relative Name and Phone Number:  Janeice Robinson, daughter [phone]    Current Level of Care: Hospital Recommended Level of Care: [place] Prior Approval Number:    Date Approved/Denied:   PASRR Number: [id number]  Discharge Plan: SNF    Current Diagnoses: Patient Active Problem List   Diagnosis Date Noted  . Goals of care, counseling/discussion   . Palliative care by specialist   . Acute anemia 09/06/2019  . Aphasia 09/06/2019  . Pressure injury of skin 09/06/2019  . Sepsis secondary to UTI ([place]) 09/05/2019  . Hypotension 09/05/2019  . Confusion 09/05/2019  . Generalized weakness 09/05/2019  . Lactic acidosis 09/05/2019  . Anemia in other chronic diseases classified elsewhere 09/05/2019  . Thrombocytopenia ([place]) 09/05/2019  . Sepsis ([place]) 09/05/2019  . Controlled type 2 diabetes with neuropathy ([place]) 04/15/2016  . Gastric ulcer 07/02/2014  . HYPOKALEMIA, HX OF 02/09/2008  . Dyslipidemia 01/31/2007  . Anxiety state 01/31/2007  . Essential hypertension 01/31/2007  . Osteoarthritis 01/31/2007  . HEADACHE 01/31/2007    Orientation RESPIRATION BLADDER Height & Weight     (Disoriented x4)  O2(Nasal cannula 1L) Incontinent, External catheter Weight: 148 lb 5.9 oz (67.3 kg) Height:  5' (152.4 cm)  BEHAVIORAL SYMPTOMS/MOOD NEUROLOGICAL BOWEL NUTRITION STATUS  (None)   Continent Diet(Please see DC Summary)  AMBULATORY STATUS COMMUNICATION OF NEEDS Skin   Extensive Assist Verbally PU Stage and Appropriate Care(Unstageable on ankle, heel;  Stage II on buttocks and coccyx)                       Personal Care Assistance Level of Assistance  Bathing, Feeding, Dressing Bathing Assistance: Maximum assistance Feeding assistance: Maximum assistance Dressing Assistance: Maximum assistance     Functional Limitations Info  Sight, Hearing, Speech Sight Info: Adequate Hearing Info: Adequate Speech Info: Adequate    SPECIAL CARE FACTORS FREQUENCY  PT (By licensed PT), OT (By licensed OT)     PT Frequency: 4x OT Frequency: 3x            Contractures Contractures Info: Not present    Additional Factors Info  Code Status, Allergies, Insulin Sliding Scale Code Status Info: DNR Allergies Info: Codeine   Insulin Sliding Scale Info: See dc summary for dose       Current Medications (09/09/2019):  This is the current hospital active medication list Current Facility-Administered Medications  Medication Dose Route Frequency Provider Last Rate Last Admin  . acetaminophen (TYLENOL) tablet 650 mg  650 mg Oral Q6H PRN Swayze, Ava, DO   650 mg at 09/09/19 1123   Or  . acetaminophen (TYLENOL) suppository 650 mg  650 mg Rectal Q6H PRN Swayze, Ava, DO      . albuterol (PROVENTIL) (2.5 MG/3ML) 0.083% nebulizer solution 2.5 mg  2.5 mg Nebulization Q4H PRN Gardiner Barefoot, NP   2.5 mg at 09/09/19 0538  . cefTRIAXone (ROCEPHIN) 1 g in sodium chloride 0.9 % 100 mL IVPB  1 g Intravenous Q24H Aline August, MD 200 mL/hr at 09/08/19 1207 New Bag at 09/08/19 1207  .  collagenase (SANTYL) ointment   Topical Daily Dimple Nanas, MD   Given at 09/09/19 1025  . hydroxypropyl methylcellulose / hypromellose (ISOPTO TEARS / GONIOVISC) 2.5 % ophthalmic solution 1 drop  1 drop Both Eyes Daily PRN Swayze, Ava, DO      . insulin aspart (novoLOG) injection 0-6 Units  0-6 Units Subcutaneous TID WC Swayze, Ava, DO   3 Units at 09/09/19 0820  . MEDLINE mouth rinse  15 mL Mouth Rinse BID Hanley Ben, Kshitiz, MD   15 mL at 09/09/19 1025  .  ondansetron (ZOFRAN) tablet 4 mg  4 mg Oral Q6H PRN Swayze, Ava, DO       Or  . ondansetron (ZOFRAN) injection 4 mg  4 mg Intravenous Q6H PRN Swayze, Ava, DO      . pantoprazole (PROTONIX) EC tablet 40 mg  40 mg Oral Daily Swayze, Ava, DO   40 mg at 09/09/19 1122  . senna-docusate (Senokot-S) tablet 1 tablet  1 tablet Oral QHS PRN Swayze, Ava, DO      . sorbitol 70 % solution 30 mL  30 mL Oral Daily PRN Swayze, Ava, DO         Discharge Medications: Please see discharge summary for a list of discharge medications.  Relevant Imaging Results:  Relevant Lab Results:   Additional Information SSN: [id number]        COVID negative 2/9. Palliative to follow at SNF.  Mearl Latin, LCSW

## 2019-09-09 NOTE — Progress Notes (Signed)
Report received from [facility].  Patient transferred to [facility] Room 4.  Pt. Is sleeping and easy to arouse.  Patient with wheezing noted on assessment.  Breathing treatment given and patient repositioned in bed.  02 at 1L per  applied to provide comfort.  No acute distress noted.

## 2019-09-10 DIAGNOSIS — R680 Hypothermia, not associated with low environmental temperature: Secondary | ICD-10-CM

## 2019-09-10 DIAGNOSIS — Z515 Encounter for palliative care: Secondary | ICD-10-CM

## 2019-09-10 DIAGNOSIS — N390 Urinary tract infection, site not specified: Secondary | ICD-10-CM

## 2019-09-10 DIAGNOSIS — A419 Sepsis, unspecified organism: Principal | ICD-10-CM

## 2019-09-10 DIAGNOSIS — D649 Anemia, unspecified: Secondary | ICD-10-CM

## 2019-09-10 LAB — CULTURE, BLOOD (ROUTINE X 2)
Culture: NO GROWTH
Special Requests: ADEQUATE
Special Requests: ADEQUATE

## 2019-09-10 LAB — CBC
HCT: 23.2 % — ABNORMAL LOW (ref 36.0–46.0)
Hemoglobin: 6.9 g/dL — CL (ref 12.0–15.0)
MCH: 24.8 pg — ABNORMAL LOW (ref 26.0–34.0)
MCHC: 29.7 g/dL — ABNORMAL LOW (ref 30.0–36.0)
MCV: 83.5 fL (ref 80.0–100.0)
Platelets: 25 10*3/uL — CL (ref 150–400)
RBC: 2.78 MIL/uL — ABNORMAL LOW (ref 3.87–5.11)
RDW: 18.1 % — ABNORMAL HIGH (ref 11.5–15.5)
WBC: 3 10*3/uL — ABNORMAL LOW (ref 4.0–10.5)
nRBC: 3 % — ABNORMAL HIGH (ref 0.0–0.2)

## 2019-09-10 LAB — GLUCOSE, CAPILLARY
Glucose-Capillary: 143 mg/dL — ABNORMAL HIGH (ref 70–99)
Glucose-Capillary: 153 mg/dL — ABNORMAL HIGH (ref 70–99)
Glucose-Capillary: 239 mg/dL — ABNORMAL HIGH (ref 70–99)
Glucose-Capillary: 307 mg/dL — ABNORMAL HIGH (ref 70–99)

## 2019-09-10 LAB — MAGNESIUM: Magnesium: 1.9 mg/dL (ref 1.7–2.4)

## 2019-09-10 LAB — PREPARE RBC (CROSSMATCH)

## 2019-09-10 MED ORDER — SODIUM CHLORIDE 0.9% IV SOLUTION: Freq: Once | INTRAVENOUS | Status: DC

## 2019-09-10 MED ORDER — ACETAMINOPHEN 325 MG PO TABS
650.0000 mg | ORAL_TABLET | Freq: Three times a day (TID) | ORAL | Status: DC
  Administered 2019-09-10 – 2019-09-11 (×3): 650 mg via ORAL
  Filled 2019-09-10 (×3): qty 2

## 2019-09-10 NOTE — Progress Notes (Addendum)
Civil engineer, contracting Documentation    Liaison received referral for pt to return home s/p discharge with hospice services.   Writer contacted daughter, [name], to confirm interest. Explained hospice services and philosophy and answered all questions. Daughter agrees to hospice services and stated that pt does need a hospital bed at home. Denies any further DME needs.  Pt's chart in review by hospice MD to determine eligibility. Will update TOC and family once pt is approved.  Please do not hesitate to outreach with any questions and thank you for the referral.   1600: Hospital bed with half rails ordered. DME company to follow up with pt's daughter to inform of ETA.  Trena Platt, RN [facility] Liaison [phone]

## 2019-09-10 NOTE — Progress Notes (Addendum)
Patient ID: Christie [name], female   DOB: [birth year], 86 y.o.   MRN: [id number]  PROGRESS NOTE    Christie [name]  TWS:[id number] DOB: 1920/12/08 DOA: 09/05/2019 PCP: Philip Aspen, Limmie Patricia, MD   Brief Narrative:  86 year old female with history of hyperlipidemia, diabetes mellitus type 2, osteoarthritis, anxiety presented with weakness for past several days, poor oral intake, confusion and trouble getting words out.  In the ER, CT of the head was negative for acute abnormality.  UA was suggestive of UTI.  She was admitted for sepsis secondary to urinary tract infection.  Assessment & Plan:   Sepsis: Present on admission, probably from UTI UTI -Urine cultures growing multiple species.  Blood cultures negative so far. -Patient was started on Rocephin and vancomycin was added.  continue Rocephin.  Vancomycin DC'd on 09/07/2019 - Influenza and COVID-19 PCR negative on presentation. -Chest x-ray on presentation was negative but repeat chest x-ray on 09/06/2019 had shown possible left basilar atelectasis versus infiltrate.  Acute metabolic encephalopathy Generalized weakness/confusion and mild aphasia Moderate protein calorie malnutrition -CT of the head was negative for acute abnormality.  MRI of the brain did not show any acute infarct. -Mental status improving but not back to baseline yet.  Still a little dysarthric. -PT/OT recommends SNF.  Diet as per SLP recommendations. -Palliative care evaluation appreciated.  Condition is not improving.  Patient very hypothermic this morning with drop in hemoglobin and platelets as well.  Recommend total comfort measures/hospice. -Vitamin B12 and TSH levels normal -Follow nutrition recommendations  Anemia and thrombocytopenia of unknown etiology -No signs of bleeding.  Monitor -Status post 1 unit packed red cell transfusion on 09/06/2019 -Hemoglobin and platelets continue to drop.  Hemoglobin 6.9 this morning.  Recommend comfort measures.   Hypomagnesemia -Improved  Acute kidney injury -Baseline creatinine 0.8.  Presented with creatinine of 1.4.  Improved.  Creatinine pending today.  Hypernatremia -Probably from poor oral intake.  Improved.  Sodium level pending today.  Off IV fluids.  GERD -Continue PPI  Stage III sacral ulcer, unstageable left medial medullary ulcer: Present on admission -Management as per wound care nursing team   DVT prophylaxis: SCDs Code Status: DNR Family Communication: Spoke to daughter/[name] on phone on 09/06/2019.  Called daughter/[name] again on 09/10/2019 on phone but she did not pick up Disposition Plan: Family interested in SNF but I think patient should be made comfort measures and should go home with hospice versus residential hospice.  Consultants:  palliative care  Procedures: None  Antimicrobials:  Anti-infectives (From admission, onward)   Start     Dose/Rate Route Frequency Ordered Stop   09/08/19 1000  vancomycin (VANCOREADY) IVPB 1250 mg/250 mL  Status:  Discontinued     1,250 mg 166.7 mL/hr over 90 Minutes Intravenous Every 48 hours 09/06/19 1426 09/07/19 1110   09/07/19 1200  cefTRIAXone (ROCEPHIN) 1 g in sodium chloride 0.9 % 100 mL IVPB     1 g 200 mL/hr over 30 Minutes Intravenous Every 24 hours 09/07/19 1110     09/06/19 1230  vancomycin (VANCOREADY) IVPB 1500 mg/300 mL     1,500 mg 150 mL/hr over 120 Minutes Intravenous  Once 09/06/19 1148 09/06/19 1451   09/05/19 1330  cefTRIAXone (ROCEPHIN) 1 g in sodium chloride 0.9 % 100 mL IVPB     1 g 200 mL/hr over 30 Minutes Intravenous  Once 09/05/19 1329 09/05/19 1557       Subjective: Patient seen and examined at bedside.  Extremely poor historian.  Awake, hardly  answers any questions.  Patient has been hypothermic this morning. Objective: Vitals:   09/10/19 0417 09/10/19 0537 09/10/19 0552 09/10/19 0641  BP: 137/67     Pulse: 62     Resp:      Temp:  (!) 92.9 F (33.8 C)  (!) 93.2 F (34 C)  TempSrc:  Rectal   Rectal  SpO2: 98%  97%   Weight:      Height:        Intake/Output Summary (Last 24 hours) at 09/10/2019 0810 Last data filed at 09/10/2019 0417 Gross per 24 hour  Intake 447 ml  Output 151 ml  Net 296 ml   Filed Weights   09/05/19 2332  Weight: 67.3 kg    Examination:  General exam: Awake, hardly answers any questions.   Elderly female lying in bed.  very poor historian.  No acute distress. Respiratory system: Bilateral decreased breath sounds at bases with some crackles.  No wheezing  cardiovascular system: Rate controlled, S1-S2 heard Gastrointestinal system: Abdomen is nondistended, soft and nontender.  Bowel sounds heard  extremities: No clubbing or cyanosis; trace lower extremity edema present bilaterally    Data Reviewed: I have personally reviewed following labs and imaging studies  CBC: Recent Labs  Lab 09/05/19 1257 09/06/19 0235 09/06/19 1951 09/07/19 0321 09/08/19 0137 09/09/19 0823 09/10/19 0357  WBC 4.6   < > 7.9 7.3 4.7 4.1 3.0*  NEUTROABS 2.7  --   --   --   --   --   --   HGB 7.3*   < > 8.0* 8.1* 8.2* 7.8* 6.9*  HCT 26.1*   < > 25.9* 26.4* 26.5* 25.8* 23.2*  MCV 87.0   < > 82.0 83.0 82.3 82.2 83.5  PLT 68*   < > 40* 61* 32* 31* 25*   < > = values in this interval not displayed.   Basic Metabolic Panel: Recent Labs  Lab 09/05/19 1257 09/06/19 0235 09/07/19 0619 09/08/19 0137 09/09/19 0823 09/10/19 0357  NA 145 147* 147* 143 138  --   K 4.9 5.3* 4.9 5.3* 4.2  --   CL 117* 121* 115* 117* 112*  --   CO2 19* 19* 20* 17* 17*  --   GLUCOSE 184* 73 121* 242* 308*  --   BUN 25* 25* 24* 24* 17  --   CREATININE 1.40* 1.27* 1.14* 1.00 0.93  --   CALCIUM 8.6* 8.1* 8.2* 7.8* 7.8*  --   MG  --   --  1.3* 1.5* 2.0 1.9   GFR: Estimated Creatinine Clearance: 30.3 mL/min (by C-G formula based on SCr of 0.93 mg/dL). Liver Function Tests: Recent Labs  Lab 09/05/19 1257 09/07/19 0619 09/08/19 0137 09/09/19 0823  AST 28 44* 56* 30  ALT 24 26 23 25    ALKPHOS 63 55 59 65  BILITOT 0.3 0.8 0.9 0.6  PROT 6.3* 5.9* 5.7* 5.9*  ALBUMIN 2.7* 2.3* 2.1* 2.1*   No results for input(s): LIPASE, AMYLASE in the last 168 hours. No results for input(s): AMMONIA in the last 168 hours. Coagulation Profile: Recent Labs  Lab 09/05/19 1625  INR 1.4*   Cardiac Enzymes: No results for input(s): CKTOTAL, CKMB, CKMBINDEX, TROPONINI in the last 168 hours. BNP (last 3 results) No results for input(s): PROBNP in the last 8760 hours. HbA1C: No results for input(s): HGBA1C in the last 72 hours. CBG: Recent Labs  Lab 09/09/19 1216 09/09/19 1758 09/09/19 2339 09/10/19 0413 09/10/19 0628  GLUCAP 349* 272* 183*  143* 153*   Lipid Profile: No results for input(s): CHOL, HDL, LDLCALC, TRIG, CHOLHDL, LDLDIRECT in the last 72 hours. Thyroid Function Tests: No results for input(s): TSH, T4TOTAL, FREET4, T3FREE, THYROIDAB in the last 72 hours. Anemia Panel: No results for input(s): VITAMINB12, FOLATE, FERRITIN, TIBC, IRON, RETICCTPCT in the last 72 hours. Sepsis Labs: Recent Labs  Lab 09/05/19 1713 09/05/19 2346 09/06/19 1602 09/06/19 2035  PROCALCITON  --   --  <0.10  --   LATICACIDVEN 3.3* 3.4* 1.4 1.2    Recent Results (from the past 240 hour(s))  Culture, blood (routine x 2)     Status: None (Preliminary result)   Collection Time: 09/05/19 12:49 PM   Specimen: BLOOD  Result Value Ref Range Status   Specimen Description BLOOD RIGHT ANTECUBITAL  Final   Special Requests   Final    BOTTLES DRAWN AEROBIC AND ANAEROBIC Blood Culture adequate volume   Culture   Final    NO GROWTH 4 DAYS Performed at [facility], [address], [place], [place] [address]    Report Status PENDING  Incomplete  Urine culture     Status: Abnormal   Collection Time: 09/05/19 12:57 PM   Specimen: Urine, Random  Result Value Ref Range Status   Specimen Description URINE, RANDOM  Final   Special Requests   Final    NONE Performed at [facility]  Lab, [address] [place], [place] [address]    Culture MULTIPLE SPECIES PRESENT, SUGGEST RECOLLECTION (A)  Final   Report Status 09/06/2019 FINAL  Final  Respiratory Panel by RT PCR (Flu A&B, Covid) - Nasopharyngeal Swab     Status: None   Collection Time: 09/05/19  2:30 PM   Specimen: Nasopharyngeal Swab  Result Value Ref Range Status   SARS Coronavirus 2 by RT PCR NEGATIVE NEGATIVE Final    Comment: (NOTE) SARS-CoV-2 target nucleic acids are NOT DETECTED. The SARS-CoV-2 RNA is generally detectable in upper respiratoy specimens during the acute phase of infection. The lowest concentration of SARS-CoV-2 viral copies this assay can detect is 131 copies/mL. A negative result does not preclude SARS-Cov-2 infection and should not be used as the sole basis for treatment or other patient management decisions. A negative result may occur with  improper specimen collection/handling, submission of specimen other than nasopharyngeal swab, presence of viral mutation(s) within the areas targeted by this assay, and inadequate number of viral copies (<131 copies/mL). A negative result must be combined with clinical observations, patient history, and epidemiological information. The expected result is Negative. Fact Sheet for Patients:  https://www.moore.com/ Fact Sheet for Healthcare Providers:  https://www.young.biz/ This test is not yet ap proved or cleared by the [place] FDA and  has been authorized for detection and/or diagnosis of SARS-CoV-2 by FDA under an Emergency Use Authorization (EUA). This EUA will remain  in effect (meaning this test can be used) for the duration of the COVID-19 declaration under Section 564(b)(1) of the Act, 21 U.S.C. section 360bbb-3(b)(1), unless the authorization is terminated or revoked sooner.    Influenza A by PCR NEGATIVE NEGATIVE Final   Influenza B by PCR NEGATIVE NEGATIVE Final    Comment: (NOTE) The Xpert  Xpress SARS-CoV-2/FLU/RSV assay is intended as an aid in  the diagnosis of influenza from Nasopharyngeal swab specimens and  should not be used as a sole basis for treatment. Nasal washings and  aspirates are unacceptable for Xpert Xpress SARS-CoV-2/FLU/RSV  testing. Fact Sheet for Patients: https://www.moore.com/ Fact Sheet  for Healthcare Providers: https://www.young.biz/ This test is not yet approved or cleared by the [place] and  has been authorized for detection and/or diagnosis of SARS-CoV-2 by  FDA under an Emergency Use Authorization (EUA). This EUA will remain  in effect (meaning this test can be used) for the duration of the  Covid-19 declaration under Section 564(b)(1) of the Act, 21  U.S.C. section 360bbb-3(b)(1), unless the authorization is  terminated or revoked. Performed at [facility], [address], [place], [place] [address]   Culture, blood (routine x 2)     Status: None (Preliminary result)   Collection Time: 09/05/19  2:35 PM   Specimen: BLOOD LEFT WRIST  Result Value Ref Range Status   Specimen Description BLOOD LEFT WRIST  Final   Special Requests   Final    BOTTLES DRAWN AEROBIC AND ANAEROBIC Blood Culture adequate volume   Culture  Setup Time   Final    GRAM POSITIVE RODS AEROBIC BOTTLE ONLY CRITICAL RESULT CALLED TO, READ BACK BY AND VERIFIED WITH: PHARMD GREG ABBOTT [id number] FCP    Culture   Final    GRAM POSITIVE RODS IDENTIFICATION TO FOLLOW Performed at [facility], [address] [place], [place] [address]    Report Status PENDING  Incomplete  MRSA PCR Screening     Status: None   Collection Time: 09/05/19 11:25 PM   Specimen: Nasal Mucosa; Nasopharyngeal  Result Value Ref Range Status   MRSA by PCR NEGATIVE NEGATIVE Final    Comment:        The GeneXpert MRSA Assay (FDA approved for NASAL specimens only), is one component of a comprehensive MRSA colonization surveillance  program. It is not intended to diagnose MRSA infection nor to guide or monitor treatment for MRSA infections. Performed at [facility], [address] [place], [place] [address]   Culture, Urine     Status: Abnormal   Collection Time: 09/06/19  1:00 PM   Specimen: Urine, Clean Catch  Result Value Ref Range Status   Specimen Description URINE, CLEAN CATCH  Final   Special Requests NONE  Final   Culture (A)  Final    <10,000 COLONIES/mL INSIGNIFICANT GROWTH Performed at [facility], [address] [place], [place] [address]    Report Status 09/07/2019 FINAL  Final         Radiology Studies: No results found.      Scheduled Meds: . sodium chloride   Intravenous Once  . collagenase   Topical Daily  . insulin aspart  0-6 Units Subcutaneous TID WC  . mouth rinse  15 mL Mouth Rinse BID  . pantoprazole  40 mg Oral Daily   Continuous Infusions: . cefTRIAXone (ROCEPHIN)  IV 1 g (09/09/19 1202)          Glade Lloyd, MD [facility] 09/10/2019, 8:10 AM

## 2019-09-10 NOTE — Progress Notes (Signed)
Temp 92.9. Text paged NP Bodenheimer.  Will place patient on warm blankets as ordered

## 2019-09-10 NOTE — Progress Notes (Signed)
CRITICAL VALUE ALERT  Critical Value:  Hemoglobin 6.9, Platelets 25   Date & Time Notied: 09/10/2019 0511  Provider Notified: NP Bodenheimer   Orders Received/Actions taken: Text paged. Awaiting response

## 2019-09-10 NOTE — Progress Notes (Signed)
Met with patient and her daughter Christie [name] this morning.  The patient is under a Retail banker and has a unit of blood transfusing.  She appears happy and comfortable.  I talked with Christie [name] about my concern that on 2/10 the patient's hgb and platelets were very low and she had had a very low temperature on admission (91 degrees).  She received a unit and blood and IVF with D5 and had done well.  Then last night her hgb and temperature dropped precipitously once again - just 4 days later.  There has been no evidence of bleeding.  This indicates a pattern.  Christie [name]'s body is unable to produce the blood cells needed to survive and she is unable to maintain a body temperature consistent with survival.   My thought is that it is likely that within a few days to a week she will have another significant drop in Hgb and temperature.    I suggested that she not be discharged to SNF for rehab, but rather spend her valuable time at home with her daughters.   Christie [name] wanted to discuss this with her sisters.  We invited them in for a 2:00 pm Palliative Care meeting.  2:00 pm  I met with the patient's 3 daughters at bedside.  After examining Christie [name] with went to a conference room to sit down and talk further.  I explained Christie [name]'s situation once again.  Her body is not maintaining its temperature and she is unable to produce blood cells sufficient enough to maintain her hgb.  Both temperature and blood cells dropped significantly in just 4 days.  We discussed hospice support in the home as well as [place].  After a discussion of care needs the family decided to attempt to take her home and avoid SNF in her final days.  After placing a TOC order, I contacted [name] and made her aware of the family's request for Hospice Services in the home.  PE Very pleasant 86 yo female, awake, alert, NAD under Bair Hugger CV rrr resp no distress Abdomen soft, nt, nd  Recommendations:  Home with Hospice as  soon as a bed can be delivered to the home.   Patient will need wound care as well.  Prognosis:  Days to weeks given her pattern of worsening anemia and hypothermia over the course of 4 days.  Disposition:  Home with Hospice - Family requests [facility] as they live very near [facility].  [name], PA-C Palliative Medicine Office:  [phone]  60 min.

## 2019-09-11 ENCOUNTER — Telehealth: Payer: Self-pay | Admitting: Internal Medicine

## 2019-09-11 LAB — TYPE AND SCREEN
ABO/RH(D): O POS
Antibody Screen: NEGATIVE
Unit division: 0

## 2019-09-11 LAB — CBC
HCT: 29.1 % — ABNORMAL LOW (ref 36.0–46.0)
Hemoglobin: 8.8 g/dL — ABNORMAL LOW (ref 12.0–15.0)
MCH: 25.7 pg — ABNORMAL LOW (ref 26.0–34.0)
MCHC: 30.2 g/dL (ref 30.0–36.0)
MCV: 85.1 fL (ref 80.0–100.0)
Platelets: 30 10*3/uL — ABNORMAL LOW (ref 150–400)
RBC: 3.42 MIL/uL — ABNORMAL LOW (ref 3.87–5.11)
RDW: 17.1 % — ABNORMAL HIGH (ref 11.5–15.5)
WBC: 3.2 10*3/uL — ABNORMAL LOW (ref 4.0–10.5)
nRBC: 2.5 % — ABNORMAL HIGH (ref 0.0–0.2)

## 2019-09-11 LAB — BASIC METABOLIC PANEL
Anion gap: 10 (ref 5–15)
BUN: 20 mg/dL (ref 8–23)
CO2: 19 mmol/L — ABNORMAL LOW (ref 22–32)
Calcium: 8.2 mg/dL — ABNORMAL LOW (ref 8.9–10.3)
Chloride: 116 mmol/L — ABNORMAL HIGH (ref 98–111)
Creatinine, Ser: 0.97 mg/dL (ref 0.44–1.00)
GFR calc Af Amer: 57 mL/min — ABNORMAL LOW (ref >60)
GFR calc non Af Amer: 49 mL/min — ABNORMAL LOW (ref >60)
Glucose, Bld: 171 mg/dL — ABNORMAL HIGH (ref 70–99)
Potassium: 4.7 mmol/L (ref 3.5–5.1)
Sodium: 145 mmol/L (ref 135–145)

## 2019-09-11 LAB — BPAM RBC
Blood Product Expiration Date: 202103152359
ISSUE DATE / TIME: 202102141039
Unit Type and Rh: 5100

## 2019-09-11 LAB — GLUCOSE, CAPILLARY
Glucose-Capillary: 148 mg/dL — ABNORMAL HIGH (ref 70–99)
Glucose-Capillary: 152 mg/dL — ABNORMAL HIGH (ref 70–99)
Glucose-Capillary: 204 mg/dL — ABNORMAL HIGH (ref 70–99)

## 2019-09-11 LAB — MAGNESIUM: Magnesium: 1.9 mg/dL (ref 1.7–2.4)

## 2019-09-11 MED ORDER — MORPHINE SULFATE 10 MG/5ML PO SOLN: 5.0000 mg | ORAL | 0 refills | Status: AC | PRN

## 2019-09-11 NOTE — TOC Transition Note (Addendum)
Transition of Care [facility]) - CM/SW Discharge Note   Patient Details  Name: Christie [name] MRN: [id number] Date of Birth: 06-08-1921  Transition [facility]) CM/SW Contact:  Doy Hutching, LCSW Phone Number: 09/11/2019, 11:16 AM   Clinical Narrative:    4:03pm- CSW confirmed with pt daughter [name] via telephone that delivery of bed to pt home had been completed. Requested PTAR for discharge home.   11:16am- CSW spoke with Carley Hammed, liaison with [facility], who is pt preferred provider at this time. At this time CSW aware bed is to be delivered to pt home; per Carley Hammed this will be completed after 3pm. CSW will speak with daughter to determine when bed has been delivered in order to arrange transportation.    Final next level of care: Home w Hospice Care Barriers to Discharge: Equipment Delay   Patient Goals and CMS Choice Patient states their goals for this hospitalization and ongoing recovery are:: Rehab prior to return home CMS Medicare.gov Compare Post Acute Care list provided to:: Patient Represenative (must comment)(Daughter, [name]) Choice offered to / list presented to : Adult Children  Discharge Placement Patient chooses bed at: Other - please specify in the comment section below:(home) Patient to be transferred to facility by: PTAR Name of family member notified: pt daughter Patient and family notified of of transfer: 09/11/19  Discharge Plan and Services In-house Referral: Clinical Social Work   Post Acute Care Choice: Skilled Nursing Facility          DME Arranged: Hospital bed DME Agency: [facility] Date DME Agency Contacted: 09/11/19 Time DME Agency Contacted: [phone] Representative spoke with at DME Agency: Forrestine Him  Readmission Risk Interventions Readmission Risk Prevention Plan 09/11/2019  Post Dischage Appt Not Complete  Appt Comments plan for home under hospice care  Medication Screening Complete  Transportation Screening Complete   Some recent data might be hidden

## 2019-09-11 NOTE — Progress Notes (Signed)
Patient being discharged home with hospice via PTAR in stable condition. Daughter handed discharge packet for hospice admit nurse.

## 2019-09-11 NOTE — Care Management Important Message (Signed)
Important Message  Patient Details  Name: Christie [name] MRN: [id number] Date of Birth: November 10, 1920   Medicare Important Message Given:  Yes     Mardene Sayer 09/11/2019, 1:11 PM

## 2019-09-11 NOTE — Telephone Encounter (Signed)
Amber with [facility] wanted to inform Dr. Ardyth Harps that pt has been admitted to [facility] and when discharged she will be coming to them for Hospice care. Pt would like to keep Dr, Ardyth Harps as attending so [facility] will fax over comfort care orders once pt arrives and is settled.

## 2019-09-11 NOTE — Progress Notes (Signed)
Physical Therapy Treatment Patient Details Name: Christie [name] MRN: [id number] DOB: 03-Apr-1921 Today's Date: 09/11/2019    History of Present Illness 86 yr old woman who lives at home with her family. She apparently began to be weak last Saturday. Since then she has had poor PO intake. Today she had trouble getting her words out and was very confused, found to have UTI. PMH significant for anxiety, DM II, gastric ulcer, HTN, HLD, OA    PT Comments    Pt in bed upon arrival of PT with daughter present. Despite new plan for d/c home with hospice, the daughter expressed interest in pursuing functional goals of being able to sit EOB to eat, and therefore this was the main focus of our session today. However, the pt was difficult to rouse and minimally responsive even with repeated stimuli from PT and daughter. It was discovered the pt had a BM in the bed, and +2 assist was needed to complete rolling and pericare. The pt was noticeably fatigued, so further mobility was held at this point. Daughter was in agreement. Despite previous PT recommendation of SNF, the pt and family have expressed interest in home hospice and no longer want home PT as they want to focus on pt comfort, so I have updated the PT recommendations to match family wishes. PT will sign off acutely, please feel free to re-consult if change in status or family wishes.     Follow Up Recommendations  No PT follow up;Supervision/Assistance - 24 hour(Although the pt's mobility is still appropriate for SNF-level assistance. The family would like to d/c home with home hospice. Daughter is in agreement in discontinuing PT acutely and that there is not need for HHPT at this tinme)     Ord Hospital bed(hospital bed has already been ordered)    Recommendations for Other Services       Precautions / Restrictions Precautions Precautions: Fall Restrictions Weight Bearing Restrictions: No    Mobility  Bed  Mobility Overal bed mobility: Needs Assistance Bed Mobility: Rolling Rolling: Mod assist;+2 for physical assistance         General bed mobility comments: modA of 2 to complete rolling for pericare, pt able to maintain rolled position to each side with use of bed rail  Transfers                    Ambulation/Gait                 Stairs             Wheelchair Mobility    Modified Rankin (Stroke Patients Only)       Balance Overall balance assessment: Needs assistance(unable to achieve sitting EOB today)                                          Cognition Arousal/Alertness: Lethargic Behavior During Therapy: Flat affect Overall Cognitive Status: Impaired/Different from baseline Area of Impairment: Following commands;Safety/judgement;Problem solving                       Following Commands: (not following commands until guided through movement) Safety/Judgement: Decreased awareness of safety;Decreased awareness of deficits   Problem Solving: Decreased initiation;Difficulty sequencing;Requires tactile cues;Slow processing;Requires verbal cues General Comments: Pt with minimal responsiveness, daughter was present and wanting to continue working with PT towards static sitting to  allow for eating, but as session progressed, pt appeared to be less interested in mobiltiy most likely due to fatigue      Exercises      General Comments        Pertinent Vitals/Pain Pain Assessment: Faces Faces Pain Scale: Hurts even more Pain Location: LEs Pain Descriptors / Indicators: Grimacing Pain Intervention(s): Monitored during session    Home Living                      Prior Function            PT Goals (current goals can now be found in the care plan section) Acute Rehab PT Goals Patient Stated Goal: to improve mobility PT Goal Formulation: With patient/family Time For Goal Achievement: 09/21/19 Potential to  Achieve Goals: Poor Additional Goals Additional Goal #1: Pt will mobilize in manual wheelchair for 25' with use of BUE and BLEs and minA Progress towards PT goals: Not progressing toward goals - comment(pt decline in function, opting for home with home hospice and comfort measures)    Frequency           PT Plan Discharge plan needs to be updated    Co-evaluation              AM-PAC PT "6 Clicks" Mobility   Outcome Measure  Help needed turning from your back to your side while in a flat bed without using bedrails?: A Lot Help needed moving from lying on your back to sitting on the side of a flat bed without using bedrails?: Total Help needed moving to and from a bed to a chair (including a wheelchair)?: Total Help needed standing up from a chair using your arms (e.g., wheelchair or bedside chair)?: Total Help needed to walk in hospital room?: Total Help needed climbing 3-5 steps with a railing? : Total 6 Click Score: 7    End of Session   Activity Tolerance: Patient limited by lethargy Patient left: with family/visitor present;in bed;with call bell/phone within reach         Time: 1696-7893 PT Time Calculation (min) (ACUTE ONLY): 20 min  Charges:  $Therapeutic Activity: 8-22 mins                     Rolm Baptise, PT, DPT   Acute Rehabilitation Department Pager #: [phone]   Gaetana Michaelis 09/11/2019, 1:21 PM

## 2019-09-11 NOTE — Discharge Summary (Signed)
Physician Discharge Summary  Christie [name] WPV:[id number] DOB: [birth year] DOA: 09/05/2019  PCP: Philip Aspen, Limmie Patricia, MD  Admit date: 09/05/2019 Discharge date: 09/11/2019  Admitted From: Home Disposition: Home with hospice  Recommendations for Outpatient Follow-up:  1. Follow up with home hospice at earliest convenience    Home Health: Home hospice Equipment/Devices: For comfort as needed  Discharge Condition: Poor CODE STATUS: DNR Diet recommendation: Per comfort measures  Brief/Interim Summary: 86 year old female with history of hyperlipidemia, diabetes mellitus type 2, osteoarthritis, anxiety presented with weakness for past several days, poor oral intake, confusion and trouble getting words out.  In the ER, CT of the head was negative for acute abnormality.  UA was suggestive of UTI.  She was admitted for sepsis secondary to urinary tract infection.  She was treated with intravenous antibiotics and fluids.  During the hospitalization, her mental status improved only slightly.  MRI of the brain did not show any acute infarct.  Palliative care was consulted for goals of care discussion.  She subsequently became hypothermic and her hemoglobin and platelets continue to drop.  Family has decided to take her home with home hospice.  She will be discharged home with hospice once arrangements can be made today.  Discharge Diagnoses:  Sepsis: Present on admission UTI Acute metabolic encephalopathy Generalized weakness/confusion and mild aphasia Moderate protein calorie malnutrition Anemia and thrombocytopenia of unknown etiology Hypomagnesemia Acute kidney injury Hypernatremia GERD Stage III sacral ulcer, unstageable left medial malleolar ulcer  Plan - She was admitted for sepsis secondary to urinary tract infection.  She was treated with intravenous antibiotics and fluids.  During the hospitalization, her mental status improved only slightly.  MRI of the brain did not show any  acute infarct.  Palliative care was consulted for goals of care discussion.  She subsequently became hypothermic and her hemoglobin and platelets continue to drop.  Family has decided to take her home with home hospice.  She will be discharged home with hospice once arrangements can be made today.   Discharge Instructions   Allergies as of 09/11/2019      Reactions   Codeine       Medication List    STOP taking these medications   glucose blood test strip   Lancets Ultra Fine Misc   metFORMIN 1000 MG tablet Commonly known as: GLUCOPHAGE   pantoprazole 40 MG tablet Commonly known as: PROTONIX     TAKE these medications   carboxymethylcellulose 0.5 % Soln Commonly known as: REFRESH PLUS Place 1 drop into both eyes daily as needed (dry eyes).   diazepam 2 MG tablet Commonly known as: VALIUM 1 tablet daily as needed for anxiety or sleep   morphine 10 MG/5ML solution Take 2.5 mLs (5 mg total) by mouth every 2 (two) hours as needed for severe pain.   Santyl ointment Generic drug: collagenase Apply 1 application topically daily. What changed: additional instructions      Follow-up Information    Home hospice Follow up.   Why: at earliest convenience         Allergies  Allergen Reactions  . Codeine     Consultations:  Palliative care   Procedures/Studies: CT Head Wo Contrast  Result Date: 09/05/2019 CLINICAL DATA:  Focal neurologic deficit greater than 6 hours, right-sided weakness and right-sided facial droop for 4 days EXAM: CT HEAD WITHOUT CONTRAST TECHNIQUE: Contiguous axial images were obtained from the base of the skull through the vertex without intravenous contrast. COMPARISON:  None. FINDINGS: Brain: Hypodensities  in the bilateral periventricular white matter are compatible with age-indeterminate small vessel ischemic changes, favor chronic. No other signs of acute infarct or hemorrhage. There is mild diffuse cerebral atrophy with ex vacuo dilatation of  the lateral ventricles. Remaining midline structures are unremarkable. There are no acute extra-axial fluid collections. Vascular: No hyperdense vessel or unexpected calcification. Skull: Normal. Negative for fracture or focal lesion. Sinuses/Orbits: No acute finding. Other: None IMPRESSION: 1. No CT evidence of acute intracranial abnormality. 2. Age-indeterminate small vessel ischemic changes in the bilateral periventricular white matter. 3. Mild diffuse cerebral atrophy. Electronically Signed   By: Randa Ngo M.D.   On: 09/05/2019 15:32   MR BRAIN WO CONTRAST  Result Date: 09/07/2019 CLINICAL DATA:  86 year old female with weakness for several days. Confusion, decreased p.o., abnormal speech. EXAM: MRI HEAD WITHOUT CONTRAST TECHNIQUE: Multiplanar, multiecho pulse sequences of the brain and surrounding structures were obtained without intravenous contrast. COMPARISON:  Head CT 09/05/2019. FINDINGS: Brain: No restricted diffusion to suggest acute infarction. No midline shift, mass effect, evidence of mass lesion, ventriculomegaly, extra-axial collection or acute intracranial hemorrhage. Cervicomedullary junction and pituitary are within normal limits. Moderate patchy and confluent bilateral cerebral white matter T2 and FLAIR hyperintensity. No definite cortical encephalomalacia, chronic cerebral blood products. The deep gray nuclei, brainstem and cerebellum are within normal limits for age. Vascular: Major intracranial vascular flow voids are preserved. Skull and upper cervical spine: Cervical spine degeneration with probable mild spinal stenosis visible at C3-C4 and C4-C5 on series 13, image 11. Visualized bone marrow signal is within normal limits. Sinuses/Orbits: Postoperative changes to both globes, otherwise negative orbits. Paranasal sinuses and mastoids are stable and well pneumatized. Other: Scalp and face soft tissues appear negative. IMPRESSION: 1.  No acute intracranial abnormality. 2. Moderate  cerebral white matter signal changes, nonspecific but most commonly due to chronic small vessel disease. 3. Cervical spine degeneration with suspected mild spinal stenosis. Electronically Signed   By: Genevie Ann M.D.   On: 09/07/2019 15:50   DG Chest Port 1 View  Result Date: 09/06/2019 CLINICAL DATA:  Dyspnea. EXAM: PORTABLE CHEST 1 VIEW COMPARISON:  September 05, 2019. FINDINGS: Stable cardiomegaly. Atherosclerosis of thoracic aorta. No pneumothorax is noted. Interval development of left basilar atelectasis or infiltrate is noted with possible small pleural effusion. Right lung is unremarkable. Bony thorax is unremarkable. IMPRESSION: Aortic atherosclerosis. Interval development of left basilar atelectasis or infiltrate with possible small pleural effusion. Electronically Signed   By: Marijo Conception M.D.   On: 09/06/2019 09:41   DG Chest Portable 1 View  Result Date: 09/05/2019 CLINICAL DATA:  Chest pain. EXAM: PORTABLE CHEST 1 VIEW COMPARISON:  None. FINDINGS: Mild cardiomegaly is noted. No pneumothorax or pleural effusion is noted. Both lungs are clear. The visualized skeletal structures are unremarkable. IMPRESSION: No active disease. Electronically Signed   By: Marijo Conception M.D.   On: 09/05/2019 13:23       Subjective: Patient seen and examined at bedside.  She is sleepy, wakes up only very slightly, hardly answers any questions.  Does not look to be in any distress.  Discharge Exam: Vitals:   09/11/19 0218 09/11/19 0458  BP: (!) 107/50 (!) 123/48  Pulse: 80 80  Resp: 16 20  Temp: 98.7 F (37.1 C) 99.1 F (37.3 C)  SpO2: 99% 100%    General: Pt is sleepy, wakes up slightly, hardly answers any questions.  No acute distress. Cardiovascular: rate controlled, S1/S2 + Respiratory: bilateral decreased breath sounds at bases  with scattered crackles Abdominal: Soft, NT, ND, bowel sounds + Extremities: Trace lower extremity edema; no cyanosis    The results of significant diagnostics  from this hospitalization (including imaging, microbiology, ancillary and laboratory) are listed below for reference.     Microbiology: Recent Results (from the past 240 hour(s))  Culture, blood (routine x 2)     Status: None   Collection Time: 09/05/19 12:49 PM   Specimen: BLOOD  Result Value Ref Range Status   Specimen Description BLOOD RIGHT ANTECUBITAL  Final   Special Requests   Final    BOTTLES DRAWN AEROBIC AND ANAEROBIC Blood Culture adequate volume   Culture   Final    NO GROWTH 5 DAYS Performed at [facility], [address] [place], [place] [address]    Report Status 09/10/2019 FINAL  Final  Urine culture     Status: Abnormal   Collection Time: 09/05/19 12:57 PM   Specimen: Urine, Random  Result Value Ref Range Status   Specimen Description URINE, RANDOM  Final   Special Requests   Final    NONE Performed at [facility], [address] [place], [place] [address]    Culture MULTIPLE SPECIES PRESENT, SUGGEST RECOLLECTION (A)  Final   Report Status 09/06/2019 FINAL  Final  Respiratory Panel by RT PCR (Flu A&B, Covid) - Nasopharyngeal Swab     Status: None   Collection Time: 09/05/19  2:30 PM   Specimen: Nasopharyngeal Swab  Result Value Ref Range Status   SARS Coronavirus 2 by RT PCR NEGATIVE NEGATIVE Final    Comment: (NOTE) SARS-CoV-2 target nucleic acids are NOT DETECTED. The SARS-CoV-2 RNA is generally detectable in upper respiratoy specimens during the acute phase of infection. The lowest concentration of SARS-CoV-2 viral copies this assay can detect is 131 copies/mL. A negative result does not preclude SARS-Cov-2 infection and should not be used as the sole basis for treatment or other patient management decisions. A negative result may occur with  improper specimen collection/handling, submission of specimen other than nasopharyngeal swab, presence of viral mutation(s) within the areas targeted by this assay, and inadequate number of viral  copies (<131 copies/mL). A negative result must be combined with clinical observations, patient history, and epidemiological information. The expected result is Negative. Fact Sheet for Patients:  https://www.moore.com/https://www.fda.gov/media/142436/download Fact Sheet for Healthcare Providers:  https://www.young.biz/https://www.fda.gov/media/142435/download This test is not yet ap proved or cleared by the [place] FDA and  has been authorized for detection and/or diagnosis of SARS-CoV-2 by FDA under an Emergency Use Authorization (EUA). This EUA will remain  in effect (meaning this test can be used) for the duration of the COVID-19 declaration under Section 564(b)(1) of the Act, 21 U.S.C. section 360bbb-3(b)(1), unless the authorization is terminated or revoked sooner.    Influenza A by PCR NEGATIVE NEGATIVE Final   Influenza B by PCR NEGATIVE NEGATIVE Final    Comment: (NOTE) The Xpert Xpress SARS-CoV-2/FLU/RSV assay is intended as an aid in  the diagnosis of influenza from Nasopharyngeal swab specimens and  should not be used as a sole basis for treatment. Nasal washings and  aspirates are unacceptable for Xpert Xpress SARS-CoV-2/FLU/RSV  testing. Fact Sheet for Patients: https://www.moore.com/https://www.fda.gov/media/142436/download Fact Sheet for Healthcare Providers: https://www.young.biz/https://www.fda.gov/media/142435/download This test is not yet approved or cleared by the [place] FDA and  has been authorized for detection and/or diagnosis of SARS-CoV-2 by  FDA under an Emergency Use Authorization (EUA). This EUA will remain  in effect (meaning this test  can be used) for the duration of the  Covid-19 declaration under Section 564(b)(1) of the Act, 21  U.S.C. section 360bbb-3(b)(1), unless the authorization is  terminated or revoked. Performed at [facility], [address] [place], [place] [address]   Culture, blood (routine x 2)     Status: Abnormal   Collection Time: 09/05/19  2:35 PM   Specimen: BLOOD LEFT WRIST  Result Value  Ref Range Status   Specimen Description BLOOD LEFT WRIST  Final   Special Requests   Final    BOTTLES DRAWN AEROBIC AND ANAEROBIC Blood Culture adequate volume   Culture  Setup Time   Final    GRAM POSITIVE RODS AEROBIC BOTTLE ONLY CRITICAL RESULT CALLED TO, READ BACK BY AND VERIFIED WITH: PHARMD GREG ABBOTT [id number] [id number] FCP    Culture (A)  Final    DIPHTHEROIDS(CORYNEBACTERIUM SPECIES) Standardized susceptibility testing for this organism is not available. Performed at [facility], [address] [place], [place] [address]    Report Status 09/10/2019 FINAL  Final  MRSA PCR Screening     Status: None   Collection Time: 09/05/19 11:25 PM   Specimen: Nasal Mucosa; Nasopharyngeal  Result Value Ref Range Status   MRSA by PCR NEGATIVE NEGATIVE Final    Comment:        The GeneXpert MRSA Assay (FDA approved for NASAL specimens only), is one component of a comprehensive MRSA colonization surveillance program. It is not intended to diagnose MRSA infection nor to guide or monitor treatment for MRSA infections. Performed at [facility] [place] [facility], [address] [place], [place] [address]   Culture, Urine     Status: Abnormal   Collection Time: 09/06/19  1:00 PM   Specimen: Urine, Clean Catch  Result Value Ref Range Status   Specimen Description URINE, CLEAN CATCH  Final   Special Requests NONE  Final   Culture (A)  Final    <10,000 COLONIES/mL INSIGNIFICANT GROWTH Performed at [facility], [address] [place], [place] [address]    Report Status 09/07/2019 FINAL  Final     Labs: BNP (last 3 results) Recent Labs    09/05/19 1713 09/06/19 1602  BNP 31.5 125.1*   Basic Metabolic Panel: Recent Labs  Lab 09/06/19 0235 09/07/19 0619 09/08/19 0137 09/09/19 0823 09/10/19 0357 09/11/19 0757  NA 147* 147* 143 138  --  145  K 5.3* 4.9 5.3* 4.2  --  4.7  CL 121* 115* 117* 112*  --  116*  CO2 19* 20* 17* 17*  --  19*  GLUCOSE 73 121* 242* 308*  --  171*  BUN  25* 24* 24* 17  --  20  CREATININE 1.27* 1.14* 1.00 0.93  --  0.97  CALCIUM 8.1* 8.2* 7.8* 7.8*  --  8.2*  MG  --  1.3* 1.5* 2.0 1.9 1.9   Liver Function Tests: Recent Labs  Lab 09/05/19 1257 09/07/19 0619 09/08/19 0137 09/09/19 0823  AST 28 44* 56* 30  ALT 24 26 23 25   ALKPHOS 63 55 59 65  BILITOT 0.3 0.8 0.9 0.6  PROT 6.3* 5.9* 5.7* 5.9*  ALBUMIN 2.7* 2.3* 2.1* 2.1*   No results for input(s): LIPASE, AMYLASE in the last 168 hours. No results for input(s): AMMONIA in the last 168 hours. CBC: Recent Labs  Lab 09/05/19 1257 09/06/19 0235 09/07/19 0321 09/08/19 0137 09/09/19 0823 09/10/19 0357 09/11/19 0757  WBC 4.6   < > 7.3 4.7 4.1  3.0* 3.2*  NEUTROABS 2.7  --   --   --   --   --   --   HGB 7.3*   < > 8.1* 8.2* 7.8* 6.9* 8.8*  HCT 26.1*   < > 26.4* 26.5* 25.8* 23.2* 29.1*  MCV 87.0   < > 83.0 82.3 82.2 83.5 85.1  PLT 68*   < > 61* 32* 31* 25* 30*   < > = values in this interval not displayed.   Cardiac Enzymes: No results for input(s): CKTOTAL, CKMB, CKMBINDEX, TROPONINI in the last 168 hours. BNP: Invalid input(s): POCBNP CBG: Recent Labs  Lab 09/10/19 0628 09/10/19 1238 09/10/19 1809 09/11/19 0022 09/11/19 0623  GLUCAP 153* 239* 307* 204* 152*   D-Dimer No results for input(s): DDIMER in the last 72 hours. Hgb A1c No results for input(s): HGBA1C in the last 72 hours. Lipid Profile No results for input(s): CHOL, HDL, LDLCALC, TRIG, CHOLHDL, LDLDIRECT in the last 72 hours. Thyroid function studies No results for input(s): TSH, T4TOTAL, T3FREE, THYROIDAB in the last 72 hours.  Invalid input(s): FREET3 Anemia work up No results for input(s): VITAMINB12, FOLATE, FERRITIN, TIBC, IRON, RETICCTPCT in the last 72 hours. Urinalysis    Component Value Date/Time   COLORURINE YELLOW 09/05/2019 1257   APPEARANCEUR CLOUDY (A) 09/05/2019 1257   LABSPEC 1.017 09/05/2019 1257   PHURINE 5.0 09/05/2019 1257   GLUCOSEU 50 (A) 09/05/2019 1257   HGBUR NEGATIVE  09/05/2019 1257   BILIRUBINUR NEGATIVE 09/05/2019 1257   KETONESUR NEGATIVE 09/05/2019 1257   PROTEINUR NEGATIVE 09/05/2019 1257   NITRITE NEGATIVE 09/05/2019 1257   LEUKOCYTESUR LARGE (A) 09/05/2019 1257   Sepsis Labs Invalid input(s): PROCALCITONIN,  WBC,  LACTICIDVEN Microbiology Recent Results (from the past 240 hour(s))  Culture, blood (routine x 2)     Status: None   Collection Time: 09/05/19 12:49 PM   Specimen: BLOOD  Result Value Ref Range Status   Specimen Description BLOOD RIGHT ANTECUBITAL  Final   Special Requests   Final    BOTTLES DRAWN AEROBIC AND ANAEROBIC Blood Culture adequate volume   Culture   Final    NO GROWTH 5 DAYS Performed at [facility], [address], [place], [place] [address]    Report Status 09/10/2019 FINAL  Final  Urine culture     Status: Abnormal   Collection Time: 09/05/19 12:57 PM   Specimen: Urine, Random  Result Value Ref Range Status   Specimen Description URINE, RANDOM  Final   Special Requests   Final    NONE Performed at [facility], [address], [place], [place] [address]    Culture MULTIPLE SPECIES PRESENT, SUGGEST RECOLLECTION (A)  Final   Report Status 09/06/2019 FINAL  Final  Respiratory Panel by RT PCR (Flu A&B, Covid) - Nasopharyngeal Swab     Status: None   Collection Time: 09/05/19  2:30 PM   Specimen: Nasopharyngeal Swab  Result Value Ref Range Status   SARS Coronavirus 2 by RT PCR NEGATIVE NEGATIVE Final    Comment: (NOTE) SARS-CoV-2 target nucleic acids are NOT DETECTED. The SARS-CoV-2 RNA is generally detectable in upper respiratoy specimens during the acute phase of infection. The lowest concentration of SARS-CoV-2 viral copies this assay can detect is 131 copies/mL. A negative result does not preclude SARS-Cov-2 infection and should not be used as the sole basis for treatment or other patient management decisions. A negative result may occur with  improper specimen collection/handling,  submission of specimen  other than nasopharyngeal swab, presence of viral mutation(s) within the areas targeted by this assay, and inadequate number of viral copies (<131 copies/mL). A negative result must be combined with clinical observations, patient history, and epidemiological information. The expected result is Negative. Fact Sheet for Patients:  https://www.moore.com/ Fact Sheet for Healthcare Providers:  https://www.young.biz/ This test is not yet ap proved or cleared by the [place] FDA and  has been authorized for detection and/or diagnosis of SARS-CoV-2 by FDA under an Emergency Use Authorization (EUA). This EUA will remain  in effect (meaning this test can be used) for the duration of the COVID-19 declaration under Section 564(b)(1) of the Act, 21 U.S.C. section 360bbb-3(b)(1), unless the authorization is terminated or revoked sooner.    Influenza A by PCR NEGATIVE NEGATIVE Final   Influenza B by PCR NEGATIVE NEGATIVE Final    Comment: (NOTE) The Xpert Xpress SARS-CoV-2/FLU/RSV assay is intended as an aid in  the diagnosis of influenza from Nasopharyngeal swab specimens and  should not be used as a sole basis for treatment. Nasal washings and  aspirates are unacceptable for Xpert Xpress SARS-CoV-2/FLU/RSV  testing. Fact Sheet for Patients: https://www.moore.com/ Fact Sheet for Healthcare Providers: https://www.young.biz/ This test is not yet approved or cleared by the [place] FDA and  has been authorized for detection and/or diagnosis of SARS-CoV-2 by  FDA under an Emergency Use Authorization (EUA). This EUA will remain  in effect (meaning this test can be used) for the duration of the  Covid-19 declaration under Section 564(b)(1) of the Act, 21  U.S.C. section 360bbb-3(b)(1), unless the authorization is  terminated or revoked. Performed at [facility], [address]  [place], [place] [address]   Culture, blood (routine x 2)     Status: Abnormal   Collection Time: 09/05/19  2:35 PM   Specimen: BLOOD LEFT WRIST  Result Value Ref Range Status   Specimen Description BLOOD LEFT WRIST  Final   Special Requests   Final    BOTTLES DRAWN AEROBIC AND ANAEROBIC Blood Culture adequate volume   Culture  Setup Time   Final    GRAM POSITIVE RODS AEROBIC BOTTLE ONLY CRITICAL RESULT CALLED TO, READ BACK BY AND VERIFIED WITH: PHARMD GREG ABBOTT [id number] FCP    Culture (A)  Final    DIPHTHEROIDS(CORYNEBACTERIUM SPECIES) Standardized susceptibility testing for this organism is not available. Performed at [facility], [address], [place], [place] [address]    Report Status 09/10/2019 FINAL  Final  MRSA PCR Screening     Status: None   Collection Time: 09/05/19 11:25 PM   Specimen: Nasal Mucosa; Nasopharyngeal  Result Value Ref Range Status   MRSA by PCR NEGATIVE NEGATIVE Final    Comment:        The GeneXpert MRSA Assay (FDA approved for NASAL specimens only), is one component of a comprehensive MRSA colonization surveillance program. It is not intended to diagnose MRSA infection nor to guide or monitor treatment for MRSA infections. Performed at [facility], [address] [place], [place] [address]   Culture, Urine     Status: Abnormal   Collection Time: 09/06/19  1:00 PM   Specimen: Urine, Clean Catch  Result Value Ref Range Status   Specimen Description URINE, CLEAN CATCH  Final   Special Requests NONE  Final   Culture (A)  Final    <10,000 COLONIES/mL INSIGNIFICANT GROWTH Performed at [facility], [address] [place], [place]  [address]    Report Status 09/07/2019 FINAL  Final     Time coordinating discharge: 35 minutes  SIGNED:   Glade Lloyd, MD  [facility] 09/11/2019, 10:01 AM

## 2019-09-12 LAB — PATHOLOGIST SMEAR REVIEW

## 2019-09-12 NOTE — Telephone Encounter (Signed)
FYI

## 2019-09-13 NOTE — Telephone Encounter (Signed)
I am ok with being hospice attending

## 2019-09-15 NOTE — Telephone Encounter (Signed)
Amber is aware

## 2019-09-28 ENCOUNTER — Telehealth: Payer: Self-pay | Admitting: Internal Medicine

## 2019-09-28 NOTE — Telephone Encounter (Signed)
Left detailed message on machine forms signed, faxed, and confirmed.

## 2019-09-28 NOTE — Telephone Encounter (Signed)
Christie [name] of [facility] is calling and would like to have a call back [phone] she is looking for a comfort care order that need to be faxed back.

## 2019-10-18 ENCOUNTER — Ambulatory Visit: Payer: Medicare Other | Admitting: Internal Medicine

## 2019-11-02 ENCOUNTER — Other Ambulatory Visit: Payer: Self-pay | Admitting: Internal Medicine

## 2019-11-06 ENCOUNTER — Telehealth: Payer: Self-pay | Admitting: Internal Medicine

## 2019-11-06 NOTE — Telephone Encounter (Signed)
Dutch Quint  They are wanting to know if they can bring the death certificate by in the morning.   I advised that she can bring it by and leave it at the front desk and they will make sure that the doctor receives the certificate.

## 2019-11-16 ENCOUNTER — Ambulatory Visit: Payer: Medicare Other | Admitting: Internal Medicine

## 2019-11-25 DEATH — deceased

## 2021-04-10 IMAGING — MR MR HEAD W/O CM
5 of 6 series · 40 of 48 positions shown · non-contrast
Comparison: Head CT 09/05/2019.

CLINICAL DATA: [AGE] female with weakness for several days.
Confusion, decreased p.o., abnormal speech.

EXAM:
MRI HEAD WITHOUT CONTRAST
TECHNIQUE: Multiplanar, multiecho pulse sequences of the brain and surrounding
structures were obtained without intravenous contrast.

[Series 5: DWI · axial · 3.0mm · 0.88mm/px · z∈[-111,+36]mm · 15 of 100 slices shown (1 of 2)]
[im 1/100]
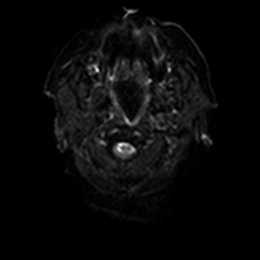
[im 8/100]
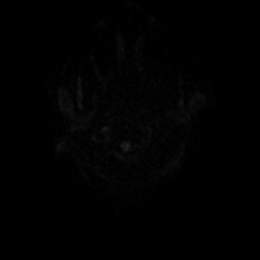
[im 15/100]
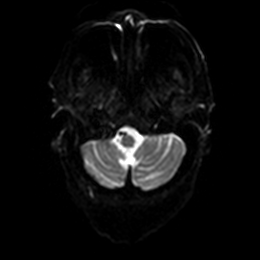
[im 22/100]
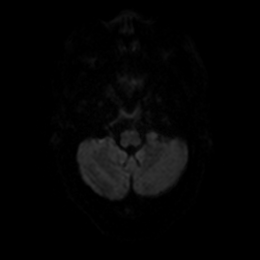
[im 29/100]
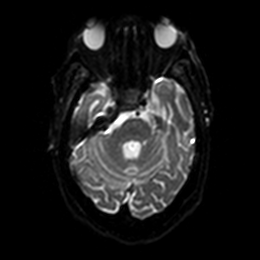
[im 36/100]
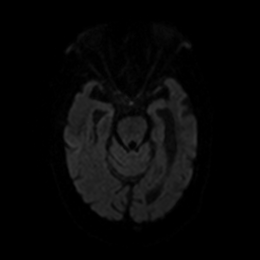
[im 43/100]
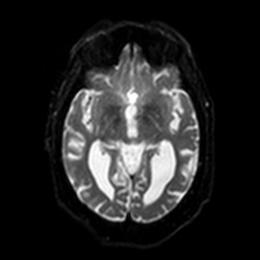
[im 50/100]
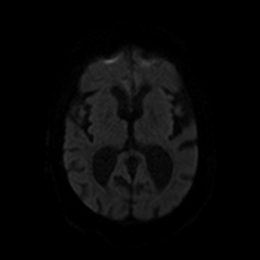
[im 57/100]
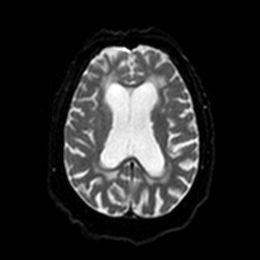
[im 64/100]
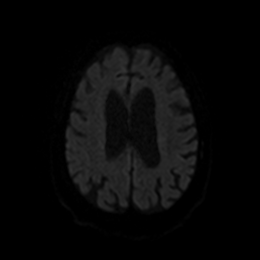
[im 71/100]
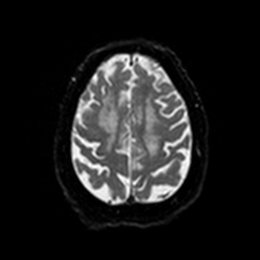
[im 78/100]
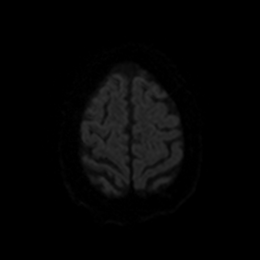
[im 85/100]
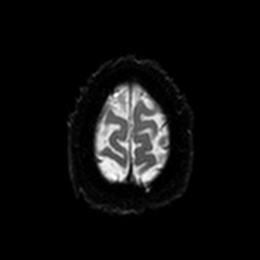
[im 92/100]
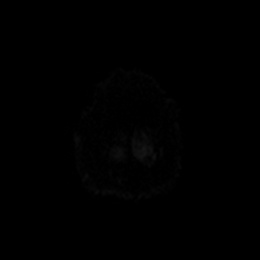
[im 100/100]
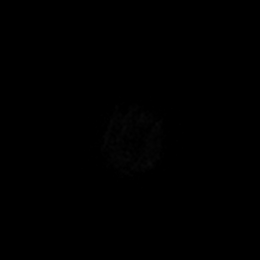

[Series 6: DWI · axial · 3.0mm · 0.88mm/px · z∈[-111,+36]mm · 7 of 50 slices shown (2 of 2)]
[im 1/50]
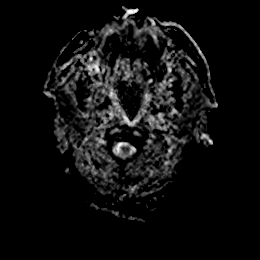
[im 9/50]
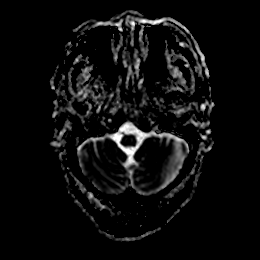
[im 17/50]
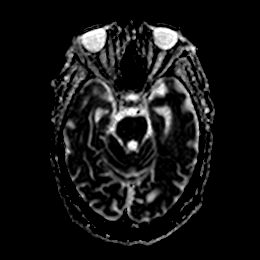
[im 25/50]
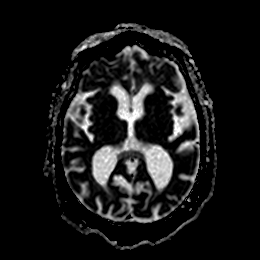
[im 33/50]
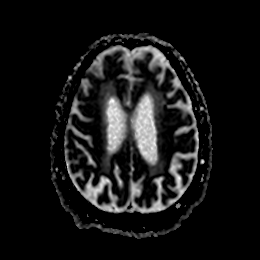
[im 41/50]
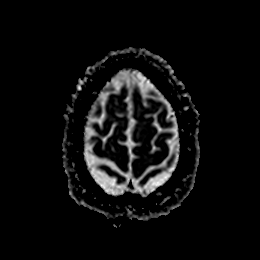
[im 50/50]
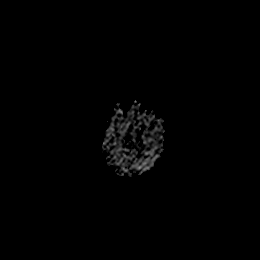

[Series 14: T2 · axial · 5.0mm · 0.72mm/px · z∈[-109,+34]mm · 3 of 25 slices shown]
[im 1/25]
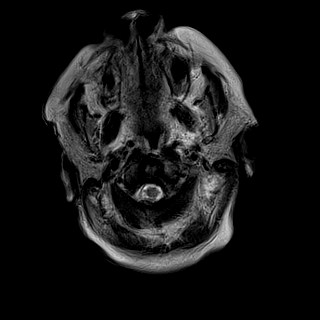
[im 13/25]
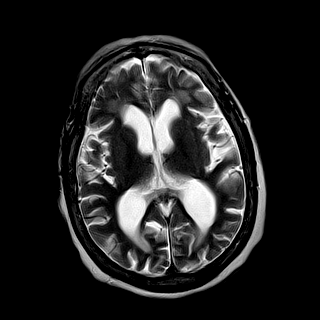
[im 25/25]
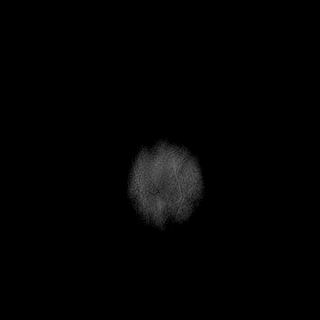

[Series 17: pha_images · axial · 3.0mm · 0.90mm/px · z∈[-121,+35]mm · 7 of 53 slices shown]
[im 1/53]
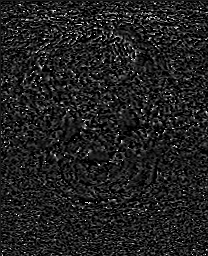
[im 9/53]
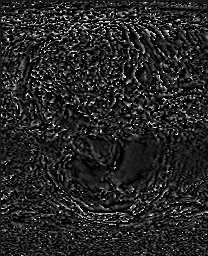
[im 18/53]
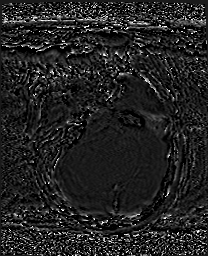
[im 27/53]
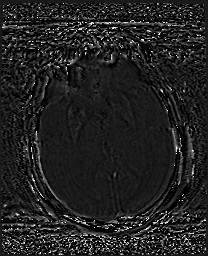
[im 35/53]
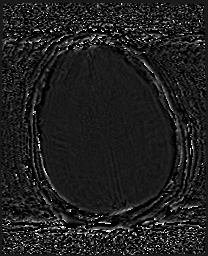
[im 44/53]
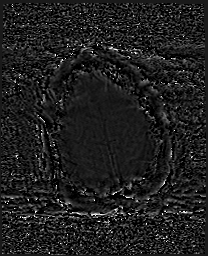
[im 53/53]
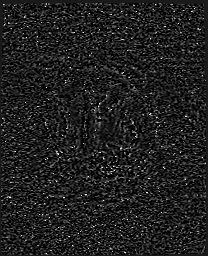

[Series 18: swi_images · axial · 3.0mm · 0.90mm/px · z∈[-127,+50]mm · 8 of 60 slices shown]
[im 1/60]
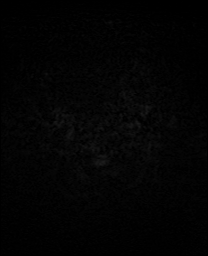
[im 9/60]
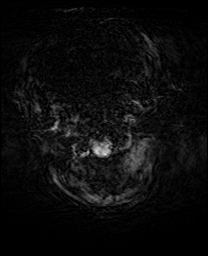
[im 17/60]
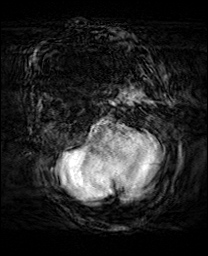
[im 26/60]
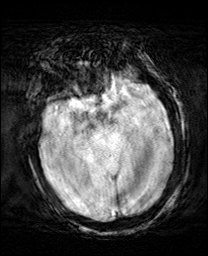
[im 34/60]
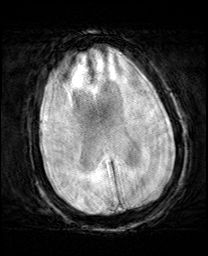
[im 43/60]
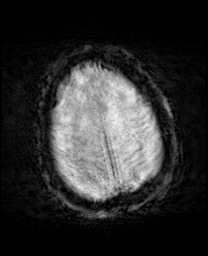
[im 51/60]
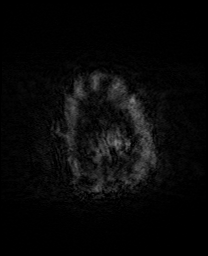
[im 60/60]
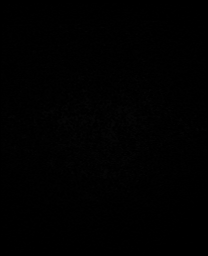

[40 of 48 positions shown; findings below may reference images not displayed]

FINDINGS: Brain: No restricted diffusion to suggest acute infarction. No
midline shift, mass effect, evidence of mass lesion,
ventriculomegaly, extra-axial collection or acute intracranial
hemorrhage. Cervicomedullary junction and pituitary are within
normal limits.

Moderate patchy and confluent bilateral cerebral white matter T2 and
FLAIR hyperintensity. No definite cortical encephalomalacia, chronic
cerebral blood products. The deep gray nuclei, brainstem and
cerebellum are within normal limits for age.

Vascular: Major intracranial vascular flow voids are preserved.

Skull and upper cervical spine: Cervical spine degeneration with
probable mild spinal stenosis visible at C3-C4 and C4-C5 on series
13, image 11. Visualized bone marrow signal is within normal limits.

Sinuses/Orbits: Postoperative changes to both globes, otherwise
negative orbits. Paranasal sinuses and mastoids are stable and well
pneumatized.

Other: Scalp and face soft tissues appear negative.
IMPRESSION: 1.  No acute intracranial abnormality.
2. Moderate cerebral white matter signal changes, nonspecific but
most commonly due to chronic small vessel disease.
3. Cervical spine degeneration with suspected mild spinal stenosis.

## 2023-08-31 NOTE — Progress Notes (Signed)
RFC
